# Patient Record
Sex: Female | Born: 1955 | Race: Black or African American | Hispanic: No | Marital: Single | Smoking: Current every day smoker
Health system: Southern US, Community
[De-identification: ages and names within clinical notes are randomized; demographics above are authoritative.]

## PROBLEM LIST (undated history)

## (undated) DIAGNOSIS — F329 Major depressive disorder, single episode, unspecified: Secondary | ICD-10-CM

## (undated) DIAGNOSIS — F32A Depression, unspecified: Secondary | ICD-10-CM

## (undated) DIAGNOSIS — E119 Type 2 diabetes mellitus without complications: Secondary | ICD-10-CM

## (undated) HISTORY — PX: ABDOMINAL HYSTERECTOMY: SHX81

---

## 2014-12-22 ENCOUNTER — Inpatient Hospital Stay (HOSPITAL_COMMUNITY)
Admission: EM | Admit: 2014-12-22 | Discharge: 2014-12-26 | DRG: 637 | Disposition: A | Discharge status: Home / Self Care | Payer: Medicaid Other | Attending: Internal Medicine | Admitting: Internal Medicine

## 2014-12-22 ENCOUNTER — Encounter (HOSPITAL_COMMUNITY): Payer: Self-pay | Admitting: *Deleted

## 2014-12-22 ENCOUNTER — Emergency Department (HOSPITAL_COMMUNITY): Payer: Medicaid Other

## 2014-12-22 DIAGNOSIS — D72829 Elevated white blood cell count, unspecified: Secondary | ICD-10-CM

## 2014-12-22 DIAGNOSIS — Z9119 Patient's noncompliance with other medical treatment and regimen: Secondary | ICD-10-CM | POA: Diagnosis present

## 2014-12-22 DIAGNOSIS — G9341 Metabolic encephalopathy: Secondary | ICD-10-CM | POA: Diagnosis present

## 2014-12-22 DIAGNOSIS — N179 Acute kidney failure, unspecified: Secondary | ICD-10-CM | POA: Insufficient documentation

## 2014-12-22 DIAGNOSIS — J449 Chronic obstructive pulmonary disease, unspecified: Secondary | ICD-10-CM | POA: Diagnosis present

## 2014-12-22 DIAGNOSIS — G9340 Encephalopathy, unspecified: Secondary | ICD-10-CM | POA: Diagnosis not present

## 2014-12-22 DIAGNOSIS — Z794 Long term (current) use of insulin: Secondary | ICD-10-CM

## 2014-12-22 DIAGNOSIS — E46 Unspecified protein-calorie malnutrition: Secondary | ICD-10-CM | POA: Diagnosis present

## 2014-12-22 DIAGNOSIS — Z720 Tobacco use: Secondary | ICD-10-CM | POA: Diagnosis not present

## 2014-12-22 DIAGNOSIS — E1310 Other specified diabetes mellitus with ketoacidosis without coma: Secondary | ICD-10-CM

## 2014-12-22 DIAGNOSIS — I959 Hypotension, unspecified: Secondary | ICD-10-CM | POA: Diagnosis present

## 2014-12-22 DIAGNOSIS — R680 Hypothermia, not associated with low environmental temperature: Secondary | ICD-10-CM | POA: Diagnosis present

## 2014-12-22 DIAGNOSIS — E876 Hypokalemia: Secondary | ICD-10-CM | POA: Diagnosis not present

## 2014-12-22 DIAGNOSIS — R6510 Systemic inflammatory response syndrome (SIRS) of non-infectious origin without acute organ dysfunction: Secondary | ICD-10-CM | POA: Diagnosis present

## 2014-12-22 DIAGNOSIS — E870 Hyperosmolality and hypernatremia: Secondary | ICD-10-CM | POA: Diagnosis not present

## 2014-12-22 DIAGNOSIS — D539 Nutritional anemia, unspecified: Secondary | ICD-10-CM | POA: Diagnosis present

## 2014-12-22 DIAGNOSIS — E1010 Type 1 diabetes mellitus with ketoacidosis without coma: Principal | ICD-10-CM | POA: Diagnosis present

## 2014-12-22 DIAGNOSIS — E860 Dehydration: Secondary | ICD-10-CM | POA: Diagnosis present

## 2014-12-22 DIAGNOSIS — E1042 Type 1 diabetes mellitus with diabetic polyneuropathy: Secondary | ICD-10-CM | POA: Diagnosis present

## 2014-12-22 DIAGNOSIS — E0810 Diabetes mellitus due to underlying condition with ketoacidosis without coma: Secondary | ICD-10-CM

## 2014-12-22 DIAGNOSIS — E1110 Type 2 diabetes mellitus with ketoacidosis without coma: Secondary | ICD-10-CM | POA: Insufficient documentation

## 2014-12-22 DIAGNOSIS — E0811 Diabetes mellitus due to underlying condition with ketoacidosis with coma: Secondary | ICD-10-CM

## 2014-12-22 DIAGNOSIS — J438 Other emphysema: Secondary | ICD-10-CM | POA: Diagnosis not present

## 2014-12-22 DIAGNOSIS — T68XXXA Hypothermia, initial encounter: Secondary | ICD-10-CM

## 2014-12-22 DIAGNOSIS — E1069 Type 1 diabetes mellitus with other specified complication: Secondary | ICD-10-CM | POA: Diagnosis not present

## 2014-12-22 HISTORY — DX: Major depressive disorder, single episode, unspecified: F32.9

## 2014-12-22 HISTORY — DX: Depression, unspecified: F32.A

## 2014-12-22 HISTORY — DX: Type 2 diabetes mellitus without complications: E11.9

## 2014-12-22 LAB — CBC WITH DIFFERENTIAL/PLATELET
BASOS PCT: 0 % (ref 0–1)
Basophils Absolute: 0 10*3/uL (ref 0.0–0.1)
Eosinophils Absolute: 0 10*3/uL (ref 0.0–0.7)
Eosinophils Relative: 0 % (ref 0–5)
HCT: 38.4 % (ref 36.0–46.0)
Hemoglobin: 10.9 g/dL — ABNORMAL LOW (ref 12.0–15.0)
LYMPHS PCT: 13 % (ref 12–46)
Lymphs Abs: 2.5 10*3/uL (ref 0.7–4.0)
MCH: 28.8 pg (ref 26.0–34.0)
MCHC: 28.4 g/dL — AB (ref 30.0–36.0)
MCV: 101.6 fL — ABNORMAL HIGH (ref 78.0–100.0)
MONO ABS: 1.5 10*3/uL — AB (ref 0.1–1.0)
Monocytes Relative: 8 % (ref 3–12)
NEUTROS ABS: 15 10*3/uL — AB (ref 1.7–7.7)
Neutrophils Relative %: 79 % — ABNORMAL HIGH (ref 43–77)
Platelets: 274 10*3/uL (ref 150–400)
RBC: 3.78 MIL/uL — AB (ref 3.87–5.11)
RDW: 15.5 % (ref 11.5–15.5)
WBC Morphology: INCREASED
WBC: 19 10*3/uL — ABNORMAL HIGH (ref 4.0–10.5)

## 2014-12-22 LAB — COMPREHENSIVE METABOLIC PANEL
ALT: 27 U/L (ref 14–54)
AST: 22 U/L (ref 15–41)
Albumin: 3.2 g/dL — ABNORMAL LOW (ref 3.5–5.0)
Alkaline Phosphatase: 96 U/L (ref 38–126)
BUN: 35 mg/dL — ABNORMAL HIGH (ref 6–20)
CO2: <5 mmol/L — ABNORMAL LOW (ref 22–32)
Calcium: 8.6 mg/dL — ABNORMAL LOW (ref 8.9–10.3)
Chloride: 110 mmol/L (ref 101–111)
Creatinine, Ser: 2.87 mg/dL — ABNORMAL HIGH (ref 0.44–1.00)
GFR calc non Af Amer: 17 mL/min — ABNORMAL LOW (ref >60)
GFR, EST AFRICAN AMERICAN: 20 mL/min — AB (ref >60)
GLUCOSE: 1386 mg/dL — AB (ref 65–99)
Potassium: >7.5 mmol/L (ref 3.5–5.1)
Sodium: 140 mmol/L (ref 135–145)
TOTAL PROTEIN: 5.6 g/dL — AB (ref 6.5–8.1)
Total Bilirubin: 1.3 mg/dL — ABNORMAL HIGH (ref 0.3–1.2)

## 2014-12-22 LAB — URINALYSIS, ROUTINE W REFLEX MICROSCOPIC
BILIRUBIN URINE: NEGATIVE
Leukocytes, UA: NEGATIVE
Nitrite: NEGATIVE
Protein, ur: 30 mg/dL — AB
SPECIFIC GRAVITY, URINE: 1.024 (ref 1.005–1.030)
UROBILINOGEN UA: 0.2 mg/dL (ref 0.0–1.0)
pH: 5 (ref 5.0–8.0)

## 2014-12-22 LAB — I-STAT ARTERIAL BLOOD GAS, ED
ACID-BASE DEFICIT: 28 mmol/L — AB (ref 0.0–2.0)
BICARBONATE: 2.8 meq/L — AB (ref 20.0–24.0)
O2 SAT: 97 %
PO2 ART: 144 mmHg — AB (ref 80.0–100.0)
pCO2 arterial: 13.9 mmHg — CL (ref 35.0–45.0)
pH, Arterial: 6.917 — CL (ref 7.350–7.450)

## 2014-12-22 LAB — GLUCOSE, CAPILLARY
GLUCOSE-CAPILLARY: 152 mg/dL — AB (ref 65–99)
GLUCOSE-CAPILLARY: 87 mg/dL (ref 65–99)
Glucose-Capillary: >600 mg/dL (ref 65–99)
Glucose-Capillary: 521 mg/dL — ABNORMAL HIGH (ref 65–99)
Glucose-Capillary: 479 mg/dL — ABNORMAL HIGH (ref 65–99)
Glucose-Capillary: 364 mg/dL — ABNORMAL HIGH (ref 65–99)
Glucose-Capillary: 326 mg/dL — ABNORMAL HIGH (ref 65–99)
Glucose-Capillary: 260 mg/dL — ABNORMAL HIGH (ref 65–99)
Glucose-Capillary: 120 mg/dL — ABNORMAL HIGH (ref 65–99)
Glucose-Capillary: 109 mg/dL — ABNORMAL HIGH (ref 65–99)
Glucose-Capillary: 96 mg/dL (ref 65–99)

## 2014-12-22 LAB — BASIC METABOLIC PANEL
Anion gap: 15 (ref 5–15)
Anion gap: 8 (ref 5–15)
BUN: 35 mg/dL — ABNORMAL HIGH (ref 6–20)
BUN: 30 mg/dL — ABNORMAL HIGH (ref 6–20)
BUN: 25 mg/dL — ABNORMAL HIGH (ref 6–20)
BUN: 22 mg/dL — ABNORMAL HIGH (ref 6–20)
BUN: 17 mg/dL (ref 6–20)
CALCIUM: 9.4 mg/dL (ref 8.9–10.3)
CHLORIDE: 130 mmol/L — AB (ref 101–111)
CO2: 9 mmol/L — ABNORMAL LOW (ref 22–32)
CO2: 15 mmol/L — AB (ref 22–32)
CO2: 12 mmol/L — ABNORMAL LOW (ref 22–32)
CO2: 18 mmol/L — ABNORMAL LOW (ref 22–32)
CREATININE: 1.61 mg/dL — AB (ref 0.44–1.00)
CREATININE: 1.02 mg/dL — AB (ref 0.44–1.00)
Calcium: 8.4 mg/dL — ABNORMAL LOW (ref 8.9–10.3)
Calcium: 9.7 mg/dL (ref 8.9–10.3)
Calcium: 9.4 mg/dL (ref 8.9–10.3)
Calcium: 8.8 mg/dL — ABNORMAL LOW (ref 8.9–10.3)
Chloride: 118 mmol/L — ABNORMAL HIGH (ref 101–111)
Chloride: >130 mmol/L (ref 101–111)
Chloride: 128 mmol/L — ABNORMAL HIGH (ref 101–111)
Creatinine, Ser: 2.51 mg/dL — ABNORMAL HIGH (ref 0.44–1.00)
Creatinine, Ser: 2.13 mg/dL — ABNORMAL HIGH (ref 0.44–1.00)
Creatinine, Ser: 1.17 mg/dL — ABNORMAL HIGH (ref 0.44–1.00)
GFR calc Af Amer: 23 mL/min — ABNORMAL LOW (ref >60)
GFR calc Af Amer: 28 mL/min — ABNORMAL LOW (ref >60)
GFR calc Af Amer: 40 mL/min — ABNORMAL LOW (ref >60)
GFR calc Af Amer: 58 mL/min — ABNORMAL LOW (ref >60)
GFR calc non Af Amer: 24 mL/min — ABNORMAL LOW (ref >60)
GFR calc non Af Amer: 34 mL/min — ABNORMAL LOW (ref >60)
GFR calc non Af Amer: 59 mL/min — ABNORMAL LOW (ref >60)
GFR, EST NON AFRICAN AMERICAN: 20 mL/min — AB (ref >60)
GFR, EST NON AFRICAN AMERICAN: 50 mL/min — AB (ref >60)
GLUCOSE: 619 mg/dL — AB (ref 65–99)
Glucose, Bld: 1114 mg/dL (ref 65–99)
Glucose, Bld: 273 mg/dL — ABNORMAL HIGH (ref 65–99)
Glucose, Bld: 119 mg/dL — ABNORMAL HIGH (ref 65–99)
Glucose, Bld: 142 mg/dL — ABNORMAL HIGH (ref 65–99)
POTASSIUM: 3.7 mmol/L (ref 3.5–5.1)
POTASSIUM: 5.9 mmol/L — AB (ref 3.5–5.1)
Potassium: 5.6 mmol/L — ABNORMAL HIGH (ref 3.5–5.1)
Potassium: 3.7 mmol/L (ref 3.5–5.1)
Potassium: 4 mmol/L (ref 3.5–5.1)
SODIUM: 148 mmol/L — AB (ref 135–145)
Sodium: 154 mmol/L — ABNORMAL HIGH (ref 135–145)
Sodium: 159 mmol/L — ABNORMAL HIGH (ref 135–145)
Sodium: 158 mmol/L — ABNORMAL HIGH (ref 135–145)
Sodium: 154 mmol/L — ABNORMAL HIGH (ref 135–145)

## 2014-12-22 LAB — RAPID URINE DRUG SCREEN, HOSP PERFORMED
AMPHETAMINES: NOT DETECTED
BARBITURATES: NOT DETECTED
Benzodiazepines: NOT DETECTED
Cocaine: POSITIVE — AB
Opiates: NOT DETECTED
TETRAHYDROCANNABINOL: NOT DETECTED

## 2014-12-22 LAB — I-STAT CG4 LACTIC ACID, ED
LACTIC ACID, VENOUS: 4.37 mmol/L — AB (ref 0.5–2.0)
Lactic Acid, Venous: 4.36 mmol/L (ref 0.5–2.0)

## 2014-12-22 LAB — URINE MICROSCOPIC-ADD ON

## 2014-12-22 LAB — TROPONIN I
Troponin I: <0.03 ng/mL (ref <0.031)
Troponin I: <0.03 ng/mL (ref <0.031)
Troponin I: <0.03 ng/mL (ref <0.031)

## 2014-12-22 LAB — MAGNESIUM
MAGNESIUM: 2.9 mg/dL — AB (ref 1.7–2.4)
MAGNESIUM: 2.8 mg/dL — AB (ref 1.7–2.4)

## 2014-12-22 LAB — PHOSPHORUS
PHOSPHORUS: 10.9 mg/dL — AB (ref 2.5–4.6)
Phosphorus: 8.2 mg/dL — ABNORMAL HIGH (ref 2.5–4.6)

## 2014-12-22 LAB — BETA-HYDROXYBUTYRIC ACID: Beta-Hydroxybutyric Acid: >8 mmol/L — ABNORMAL HIGH (ref 0.05–0.27)

## 2014-12-22 LAB — PROCALCITONIN: PROCALCITONIN: 0.56 ng/mL

## 2014-12-22 LAB — MRSA PCR SCREENING: MRSA BY PCR: NEGATIVE

## 2014-12-22 LAB — CBG MONITORING, ED
Glucose-Capillary: >600 mg/dL (ref 65–99)
Glucose-Capillary: >600 mg/dL (ref 65–99)

## 2014-12-22 LAB — LACTIC ACID, PLASMA: Lactic Acid, Venous: 2.5 mmol/L (ref 0.5–2.0)

## 2014-12-22 MED ORDER — CEFTRIAXONE SODIUM IN DEXTROSE 20 MG/ML IV SOLN
1.0000 g | INTRAVENOUS | Status: DC
  Administered 2014-12-22: 1 g via INTRAVENOUS
  Filled 2014-12-22 (×2): qty 50

## 2014-12-22 MED ORDER — DEXTROSE-NACL 5-0.45 % IV SOLN: INTRAVENOUS | Status: DC

## 2014-12-22 MED ORDER — INSULIN ASPART 100 UNIT/ML ~~LOC~~ SOLN
0.0000 [IU] | SUBCUTANEOUS | Status: DC
  Administered 2014-12-22: 2 [IU] via SUBCUTANEOUS
  Administered 2014-12-23: 5 [IU] via SUBCUTANEOUS
  Administered 2014-12-23: 2 [IU] via SUBCUTANEOUS
  Administered 2014-12-23: 4 [IU] via SUBCUTANEOUS
  Administered 2014-12-23 (×2): 5 [IU] via SUBCUTANEOUS
  Administered 2014-12-24 (×2): 1 [IU] via SUBCUTANEOUS
  Administered 2014-12-24: 3 [IU] via SUBCUTANEOUS

## 2014-12-22 MED ORDER — SODIUM CHLORIDE 0.9 % IV BOLUS (SEPSIS)
2000.0000 mL | Freq: Once | INTRAVENOUS | Status: AC
Start: 2014-12-22 — End: 2014-12-22
  Administered 2014-12-22: 2000 mL via INTRAVENOUS

## 2014-12-22 MED ORDER — VANCOMYCIN HCL IN DEXTROSE 1-5 GM/200ML-% IV SOLN
1000.0000 mg | INTRAVENOUS | Status: AC
  Administered 2014-12-22: 1000 mg via INTRAVENOUS
  Filled 2014-12-22: qty 200

## 2014-12-22 MED ORDER — SODIUM CHLORIDE 0.45 % IV SOLN
INTRAVENOUS | Status: DC
  Administered 2014-12-22: 20:00:00 via INTRAVENOUS

## 2014-12-22 MED ORDER — POTASSIUM CHLORIDE 10 MEQ/100ML IV SOLN
10.0000 meq | INTRAVENOUS | Status: AC
  Administered 2014-12-22 (×4): 10 meq via INTRAVENOUS
  Filled 2014-12-22 (×4): qty 100

## 2014-12-22 MED ORDER — ONDANSETRON HCL 4 MG/2ML IJ SOLN
INTRAMUSCULAR | Status: AC
  Administered 2014-12-22: 4 mg
  Filled 2014-12-22: qty 2

## 2014-12-22 MED ORDER — SODIUM BICARBONATE 8.4 % IV SOLN
50.0000 meq | Freq: Once | INTRAVENOUS | Status: AC
  Administered 2014-12-22: 50 meq via INTRAVENOUS
  Filled 2014-12-22: qty 50

## 2014-12-22 MED ORDER — SODIUM CHLORIDE 0.9 % IV SOLN: 500.0000 mg | INTRAVENOUS | Status: DC

## 2014-12-22 MED ORDER — SODIUM CHLORIDE 0.9 % IV SOLN
INTRAVENOUS | Status: DC
  Administered 2014-12-22: 5.4 [IU]/h via INTRAVENOUS
  Filled 2014-12-22: qty 2.5

## 2014-12-22 MED ORDER — SODIUM CHLORIDE 0.9 % IV SOLN: INTRAVENOUS | Status: AC

## 2014-12-22 MED ORDER — SODIUM CHLORIDE 0.9 % IV SOLN
INTRAVENOUS | Status: DC
  Administered 2014-12-22 (×2): via INTRAVENOUS

## 2014-12-22 MED ORDER — DEXTROSE-NACL 5-0.45 % IV SOLN
INTRAVENOUS | Status: DC
  Administered 2014-12-22 – 2014-12-23 (×3): via INTRAVENOUS

## 2014-12-22 MED ORDER — ONDANSETRON HCL 4 MG/2ML IJ SOLN
4.0000 mg | Freq: Once | INTRAMUSCULAR | Status: AC
  Administered 2014-12-22: 4 mg via INTRAVENOUS

## 2014-12-22 MED ORDER — PIPERACILLIN-TAZOBACTAM IN DEX 2-0.25 GM/50ML IV SOLN
2.2500 g | Freq: Three times a day (TID) | INTRAVENOUS | Status: DC
  Filled 2014-12-22 (×2): qty 50

## 2014-12-22 MED ORDER — INSULIN REGULAR HUMAN 100 UNIT/ML IJ SOLN
INTRAMUSCULAR | Status: DC
  Administered 2014-12-22: 10.8 [IU]/h via INTRAVENOUS
  Filled 2014-12-22 (×2): qty 2.5

## 2014-12-22 MED ORDER — PIPERACILLIN-TAZOBACTAM 3.375 G IVPB 30 MIN
3.3750 g | Freq: Once | INTRAVENOUS | Status: AC
  Administered 2014-12-22: 3.375 g via INTRAVENOUS
  Filled 2014-12-22: qty 50

## 2014-12-22 MED ORDER — HEPARIN SODIUM (PORCINE) 5000 UNIT/ML IJ SOLN
5000.0000 [IU] | Freq: Three times a day (TID) | INTRAMUSCULAR | Status: DC
  Administered 2014-12-22 – 2014-12-25 (×10): 5000 [IU] via SUBCUTANEOUS
  Filled 2014-12-22 (×9): qty 1

## 2014-12-22 NOTE — ED Notes (Signed)
Pt restless .  Temp of 90.9 rectal bair hugger applied.   No visits in this ed previously

## 2014-12-22 NOTE — ED Provider Notes (Signed)
CSN: [id number]     Arrival date & time 12/22/14  0253 History  This chart was scribed for  Nelda Bucks, MD by Bethel Born, ED Scribe. This patient was seen in room D35C/D35C and the patient's care was started at 3:36 AM.    Chief Complaint  Patient presents with  . unresponsive    The history is provided by the EMS personnel. No language interpreter was used.  Charonda Hefter is a 59 y.o. female who presents to the Emergency Department complaining of unresponsiveness. Per EMS the pt, who is a known diabetic,  was found "breathing funny" and "not acting right". History is limited due o patient's nonverbal status.   Past Medical History  Diagnosis Date  . Diabetes mellitus without complication    History reviewed. No pertinent past surgical history. No family history on file. History  Substance Use Topics  . Smoking status: Not on file  . Smokeless tobacco: Not on file  . Alcohol Use: Not on file   OB History    No data available     Review of Systems  Unable to perform ROS: Patient nonverbal      Allergies  Review of patient's allergies indicates not on file.  Home Medications   Prior to Admission medications   Not on File   BP 88/56 mmHg  Pulse 88  Temp(Src) 91.4 F (33 C) (Core (Comment))  Resp 16  SpO2 100% Physical Exam  Constitutional: She is oriented to person, place, and time. She appears well-developed and well-nourished. No distress.  HENT:  Head: Normocephalic.  Mouth/Throat: Oropharynx is clear and moist.  Eyes: Pupils are equal, round, and reactive to light.  Neck: Neck supple.  Cardiovascular: Normal rate, regular rhythm and normal heart sounds.   Pulmonary/Chest: Effort normal and breath sounds normal. Tachypnea noted. No respiratory distress. She has no wheezes.  Abdominal: Soft. She exhibits no distension. There is no tenderness. There is no rebound and no guarding.  Musculoskeletal: She exhibits no edema or tenderness.   Neurological: She is alert and oriented to person, place, and time. GCS eye subscore is 3. GCS verbal subscore is 4. GCS motor subscore is 4.  Skin: Skin is warm and dry.  Psychiatric: She has a normal mood and affect.  Nursing note and vitals reviewed.   ED Course  Procedures   Peripheral IV Placement Performed by: Nelda Bucks, MD Indication: Vascular access, blood draw, RN and Phlebotomy Tech unable Skin Prep: Alcohol Location: Right EJ Catheter size: 20 Catheter flushed and secured per standard protocol. Tolerated well without complication.  DIAGNOSTIC STUDIES: Oxygen Saturation is 100% on RA, normal by my interpretation.    COORDINATION OF CARE: 3:36 AM  Treatment plan includes   Labs Review Labs Reviewed  COMPREHENSIVE METABOLIC PANEL - Abnormal; Notable for the following:    Potassium >7.5 (*)    CO2 <5 (*)    Glucose, Bld 1386 (*)    BUN 35 (*)    Creatinine, Ser 2.87 (*)    Calcium 8.6 (*)    Total Protein 5.6 (*)    Albumin 3.2 (*)    Total Bilirubin 1.3 (*)    GFR calc non Af Amer 17 (*)    GFR calc Af Amer 20 (*)    All other components within normal limits  CBC WITH DIFFERENTIAL/PLATELET - Abnormal; Notable for the following:    WBC 19.0 (*)    RBC 3.78 (*)    Hemoglobin 10.9 (*)  MCV 101.6 (*)    MCHC 28.4 (*)    Neutrophils Relative % 79 (*)    Neutro Abs 15.0 (*)    Monocytes Absolute 1.5 (*)    All other components within normal limits  MAGNESIUM - Abnormal; Notable for the following:    Magnesium 2.9 (*)    All other components within normal limits  PHOSPHORUS - Abnormal; Notable for the following:    Phosphorus 10.9 (*)    All other components within normal limits  CBG MONITORING, ED - Abnormal; Notable for the following:    Glucose-Capillary >600 (*)    All other components within normal limits  I-STAT CG4 LACTIC ACID, ED - Abnormal; Notable for the following:    Lactic Acid, Venous 4.37 (*)    All other components within normal  limits  URINE CULTURE  CULTURE, BLOOD (ROUTINE X 2)  CULTURE, BLOOD (ROUTINE X 2)  CULTURE, BLOOD (ROUTINE X 2)  CULTURE, BLOOD (ROUTINE X 2)  URINE CULTURE  BLOOD GAS, VENOUS  URINALYSIS, ROUTINE W REFLEX MICROSCOPIC (NOT AT [facility])  BLOOD GAS, ARTERIAL  BASIC METABOLIC PANEL  BASIC METABOLIC PANEL  BASIC METABOLIC PANEL  BASIC METABOLIC PANEL  BASIC METABOLIC PANEL  MAGNESIUM  PHOSPHORUS  BETA-HYDROXYBUTYRIC ACID  TROPONIN I  TROPONIN I  TROPONIN I  URINALYSIS, ROUTINE W REFLEX MICROSCOPIC (NOT AT [facility])  URINE RAPID DRUG SCREEN, HOSP PERFORMED  URINE DRUG SCREEN, QUALITATIVE ([facility] ONLY)  HEMOGLOBIN A1C  I-STAT CG4 LACTIC ACID, ED  I-STAT CG4 LACTIC ACID, ED    Imaging Review Dg Chest Portable 1 View  12/22/2014   CLINICAL DATA:  Unresponsive.  EXAM: PORTABLE CHEST - 1 VIEW  COMPARISON:  None.  FINDINGS: A single AP portable view of the chest demonstrates no focal airspace consolidation or alveolar edema. The lungs are grossly clear. There is no large effusion or pneumothorax. Cardiac and mediastinal contours appear unremarkable.  IMPRESSION: No active disease.   Electronically Signed   By: Ellery Plunkaniel R Mitchell M.D.   On: 12/22/2014 03:37     EKG Interpretation None       CRITICAL CARE Performed by: Toy CookeyHERTY, Ulys Favia, E Total critical care time: 70 Critical care time was exclusive of separately billable procedures and treating other patients. Critical care was necessary to treat or prevent imminent or life-threatening deterioration. Critical care was time spent personally by me on the following activities: development of treatment plan with patient and/or surrogate as well as nursing, discussions with consultants, evaluation of patient's response to treatment, examination of patient, obtaining history from patient or surrogate, ordering and performing treatments and interventions, ordering and review of laboratory studies, ordering and review of radiographic studies, pulse  oximetry and re-evaluation of patient's condition.   MDM   Final diagnoses:  Diabetic ketoacidosis associated with other specified diabetes mellitus  Leukocytosis  Acute renal failure, unspecified acute renal failure type  Hypothermia, initial encounter   Patient found altered and breathing funny.  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  She is a known diabetic.  Per EMS glucose critical.  On exam, patient had Kussmal respirations, dry skin and mucus membranes.  Her mental status gradually improving with IV fluid administration, though she is nonverbal at time of arrival.  Patient found to have a pH of 6.8, PCO2 of 19.4, bicarbonate of 3.1.  Significant lactic acidosis.  A 4.37.  Leukocytosis of 19, and a glucose of 1386.  Patient received 3 L IV fluid bolus before being started on  the glucose stabilizer protocol.  She is also received an amp of bicarbonate, given her significantly low pH.  Mental status is slowly improving.  I believe she is maintaining her airway, does not need to be intubated at this point.  Spoke with critical care medicine, who recommends continued boluses of IV fluids, glucostabilizer protocol.  Patient will be admitted to ICU.    I personally performed the services described in this documentation, which was scribed in my presence. The recorded information has been reviewed and is accurate.  Ed Blalock, MD 12/22/14 [phone]

## 2014-12-22 NOTE — ED Notes (Signed)
CBG exceeds measures. Nurse notified.

## 2014-12-22 NOTE — Progress Notes (Addendum)
eLink Physician-Brief Progress Note Patient Name: Meagan Cummings DOB: 28-Oct-1955 MRN: [id number]   Date of Service  12/22/2014  HPI/Events of Note  On insulin IV infusion at 0.3 units/hour for DKA. Last blood glucose = 149. Anion Gap = 8. Also on D5 IV infusion.   eICU Interventions  Will transition to Q 4 hour sensitive Novolog SSI. No Lantus at this time d/t low insulin infusion rate.     Intervention Category Intermediate Interventions: Hyperglycemia - evaluation and treatment  Sommer,Steven Eugene 12/22/2014, 11:11 PM

## 2014-12-22 NOTE — ED Notes (Signed)
The pt is more responsive. Opening both eyes moving around restless.   Not followijng commands  Attempting to talk unable to understand

## 2014-12-22 NOTE — Progress Notes (Signed)
ANTIBIOTIC CONSULT NOTE - INITIAL  Pharmacy Consult for Vancomycin and Zosyn Indication: sepsis  Allergies not on file  Patient Measurements:    Vital Signs: Temp: 91.4 F (33 C) (07/03 0622) Temp Source: Core (Comment) (07/03 0544) BP: 88/56 mmHg (07/03 0500) Pulse Rate: 88 (07/03 0445) Intake/Output from previous day: 07/02 0701 - 07/03 0700 In: 2000 [I.V.:2000] Out: 500 [Urine:500] Intake/Output from this shift: Total I/O In: 2000 [I.V.:2000] Out: 500 [Urine:500]  Labs:  Recent Labs  12/22/14 0346  WBC 19.0*  HGB 10.9*  PLT 274  CREATININE 2.87*   CrCl cannot be calculated (Unknown ideal weight.). No results for input(s): VANCOTROUGH, VANCOPEAK, VANCORANDOM, GENTTROUGH, GENTPEAK, GENTRANDOM, TOBRATROUGH, TOBRAPEAK, TOBRARND, AMIKACINPEAK, AMIKACINTROU, AMIKACIN in the last 72 hours.   Microbiology: No results found for this or any previous visit (from the past 720 hour(s)).  Medical History: Past Medical History  Diagnosis Date  . Diabetes mellitus without complication     Medications:  See electronic med rec  Assessment: 59 y.o. female presents with AMS/nonresponsive. Pt noted to be in DKA. To be started on broad spectrum abx (Vanc and Zosyn) for sepsis. WBC elevated to 19. LA 4.36. Scr 2.87, est CrCl 16 ml/min.  Goal of Therapy:  Vancomycin trough level 15-20 mcg/ml  Plan:  Zosyn 3.375gm IV now then 2.25 gm IV  Vancomycin 1gm IV now then 500mg  IV q48h Will f/u renal function, micro data, and pt's clinical condition Vanc trough prn  Christoper Fabianaron Zohar Maroney, PharmD, BCPS Clinical pharmacist, pager [phone] 12/22/2014,6:29 AM

## 2014-12-22 NOTE — Progress Notes (Addendum)
CRITICAL VALUE ALERT  Critical value received:  Lactic acid 2.5  Date of notification:  73/08/2014  Time of notification:  12:10  Critical value read back:Yes.     Nurse who received alert:  Victorino DikeLeslie Kenzlie Disch   MD notified (1st page):  Orpha BurKaty NP  Time of first page:  12:10      Responding MD:  Orpha BurKaty NP  Time MD responded:  12:10

## 2014-12-22 NOTE — Progress Notes (Signed)
ANTIBIOTIC CONSULT NOTE - INITIAL  Pharmacy Consult for Vancomycin and Zosyn Indication: sepsis  No Known Allergies  Patient Measurements: Height: 5\' 2"  (157.5 cm) Weight: 94 lb 9.2 oz (42.9 kg) IBW/kg (Calculated) : 50.1  Vital Signs: Temp: 93.4 F (34.1 C) (07/03 0800) Temp Source: Core (Comment) (07/03 0544) BP: 89/56 mmHg (07/03 0800) Pulse Rate: 99 (07/03 0800) Intake/Output from previous day: 07/02 0701 - 07/03 0700 In: 3000 [I.V.:3000] Out: 500 [Urine:500] Intake/Output from this shift: Total I/O In: 210.9 [I.V.:10.9; IV Piggyback:200] Out: 825 [Urine:825]  Labs:  Recent Labs  12/22/14 0346 12/22/14 0718  WBC 19.0*  --   HGB 10.9*  --   PLT 274  --   CREATININE 2.87* 2.51*   Estimated Creatinine Clearance: 16.5 mL/min (by C-G formula based on Cr of 2.51). No results for input(s): VANCOTROUGH, VANCOPEAK, VANCORANDOM, GENTTROUGH, GENTPEAK, GENTRANDOM, TOBRATROUGH, TOBRAPEAK, TOBRARND, AMIKACINPEAK, AMIKACINTROU, AMIKACIN in the last 72 hours.   Microbiology: No results found for this or any previous visit (from the past 720 hour(s)).  Medical History: Past Medical History  Diagnosis Date  . Diabetes mellitus without complication     Medications:  See electronic med rec  Assessment: 59 y.o. female presents with AMS/nonresponsive. Pt noted to be in DKA. To be started on broad spectrum abx (Vanc and Zosyn) for sepsis. WBC elevated to 19. LA 4.36. Scr 2.87, est CrCl 16 ml/min.  Goal of Therapy:  Vancomycin trough level 15-20 mcg/ml  Plan:  Zosyn 3.375gm IV now then 2.25 gm IV  Vancomycin 1gm IV now then 500mg  IV q48h Will f/u renal function, micro data, and pt's clinical condition Vanc trough prn  Christoper Fabianaron Amend, PharmD, BCPS Clinical pharmacist, pager [phone] 12/22/2014,8:56 AM   Addendum:  Antibiotics narrowed to ceftriaxone. Plan: Ceftriaxone 1g IV daily Pharmacy will sign off for antibiotic dosing as no adjustments for ceftriaxone are  needed.  Celedonio MiyamotoJeremy Mikalyn Hermida, PharmD, BCPS-AQ ID Clinical Pharmacist Pager [phone]

## 2014-12-22 NOTE — Progress Notes (Signed)
eLink Physician-Brief Progress Note Patient Name: Meagan Cummings DOB: 1955-12-26 MRN: [id number]   Date of Service  12/22/2014  HPI/Events of Note  Call from EDP  - severe DKA with confusion, renal failure, lactic acidosis, pH 6.8. Some improved afte 3L Fluid bolus  eICU Interventions  More lfuid bolus dKA protocol order set Await formal eval by bedsid MD of pccm     Intervention Category Major Interventions: Acid-Base disturbance - evaluation and management  Axell Trigueros 12/22/2014, 6:26 AM

## 2014-12-22 NOTE — ED Notes (Signed)
3rd liter running per edps order.. #20 angiocath placed lt external jug vein

## 2014-12-22 NOTE — ED Notes (Signed)
The pt arrived by [facility] from the homed a a patient of hers.  Not acting right when she arrived tonight.  Found  Breathing funny and NOT ACTING RIGHT.    When ems arrived rapid respirations   bp low.  Strong odor of ketones on her breath,  On arrival to the ed kuss-maul respirations skin very dry.  cbg high.  Not opening her eyes not answering questions.  Unable to follow   Instructions .  Iv per ems  bolus nss

## 2014-12-22 NOTE — Progress Notes (Signed)
eLink Physician-Brief Progress Note Patient Name: Ronda FairlyLeslie Walker-Reading DOB: 05/17/1956 MRN: [id number]   Date of Service  12/22/2014  HPI/Events of Note  Na trending up as glucose trending down and now up to 159 on NS    eICU Interventions  Change maint fluids to half NS      Intervention Category Major Interventions: Acid-Base disturbance - evaluation and management;Electrolyte abnormality - evaluation and management  Sandrea HughsMichael Xavius Spadafore 12/22/2014, 7:28 PM

## 2014-12-22 NOTE — ED Notes (Signed)
Pt still  Agitated. Temp foley # 16 inserted dilute urine coming back through tubing.  Temp90.3 per temp foley

## 2014-12-22 NOTE — H&P (Signed)
PULMONARY / CRITICAL CARE MEDICINE   Name: Meagan Cummings MRN: [id number] DOB: 1955/11/20    ADMISSION DATE:  12/22/2014  REFERRING MD :  EDP   CHIEF COMPLAINT:  AMS  INITIAL PRESENTATION: 59 yo female with hx DM presented 7/3 when family found her altered and "breathing funny".  In ER found to have glucose >1300, acidotic with pH 6.9, hypothermic and PCCM called to admit to ICU for severe DKA.   STUDIES:    SIGNIFICANT EVENTS:    HISTORY OF PRESENT ILLNESS:  60 yo female with hx DM presented 7/3 when family found her altered and "breathing funny".  In ER found to have glucose >1300, acidotic with pH 6.9 and PCCM called to [facility] ICU for severe DKA. No other history available as pt confused, no family available.   PAST MEDICAL HISTORY :   has a past medical history of Diabetes mellitus without complication.  has no past surgical history on file. Prior to Admission medications   Not on File   No Known Allergies  FAMILY HISTORY:  has no family status information on file.  SOCIAL HISTORY:    REVIEW OF SYSTEMS:  Unable, pt confused.   SUBJECTIVE:   VITAL SIGNS: Temp:  [90.3 F (32.4 C)-93.4 F (34.1 C)] 93.4 F (34.1 C) (07/03 0800) Pulse Rate:  [88-99] 99 (07/03 0800) Resp:  [16-28] 22 (07/03 0800) BP: (83-107)/(47-62) 89/56 mmHg (07/03 0800) SpO2:  [100 %] 100 % (07/03 0800) Weight:  [94 lb 9.2 oz (42.9 kg)-110 lb (49.896 kg)] 94 lb 9.2 oz (42.9 kg) (07/03 0800) HEMODYNAMICS:   VENTILATOR SETTINGS:   INTAKE / OUTPUT:  Intake/Output Summary (Last 24 hours) at 12/22/14 4098 Last data filed at 12/22/14 0805  Gross per 24 hour  Intake 3210.89 ml  Output   1325 ml  Net 1885.89 ml    PHYSICAL EXAMINATION: General:  Thin, frail female, acutely ill appearing  Neuro:  Awake, confused, follows some commands intermittently, protecting airway.  HEENT:  Mm dry, no JVD, L EJ PIV c/d  Cardiovascular:  s1s2 rrr, tachy Lungs:  resps even, tachypneic, no increased  WOB  Abdomen:  Soft, appears mildly tender, hypoactive bs  Musculoskeletal:  Cool, dry, on bair hugger   LABS:  CBC  Recent Labs Lab 12/22/14 0346  WBC 19.0*  HGB 10.9*  HCT 38.4  PLT 274   Coag's No results for input(s): APTT, INR in the last 168 hours. BMET  Recent Labs Lab 12/22/14 0346 12/22/14 0718  NA 140 148*  K >7.5* 5.6*  CL 110 118*  CO2 <5* <5*  BUN 35* 35*  CREATININE 2.87* 2.51*  GLUCOSE 1386* 1114*   Electrolytes  Recent Labs Lab 12/22/14 0346 12/22/14 0718  CALCIUM 8.6* 8.4*  MG 2.9* 2.8*  PHOS 10.9* 8.2*   Sepsis Markers  Recent Labs Lab 12/22/14 0408 12/22/14 0617  LATICACIDVEN 4.37* 4.36*   ABG  Recent Labs Lab 12/22/14 0657  PHART 6.917*  PCO2ART 13.9*  PO2ART 144.0*   Liver Enzymes  Recent Labs Lab 12/22/14 0346  AST 22  ALT 27  ALKPHOS 96  BILITOT 1.3*  ALBUMIN 3.2*   Cardiac Enzymes  Recent Labs Lab 12/22/14 0718  TROPONINI <0.03   Glucose  Recent Labs Lab 12/22/14 0329 12/22/14 0729  GLUCAP >600* >600*    Imaging Dg Chest Portable 1 View  12/22/2014   CLINICAL DATA:  Unresponsive.  EXAM: PORTABLE CHEST - 1 VIEW  COMPARISON:  None.  FINDINGS: A single AP portable view  of the chest demonstrates no focal airspace consolidation or alveolar edema. The lungs are grossly clear. There is no large effusion or pneumothorax. Cardiac and mediastinal contours appear unremarkable.  IMPRESSION: No active disease.   Electronically Signed   By: Ellery Plunkaniel R Mitchell M.D.   On: 12/22/2014 03:37     ASSESSMENT / PLAN:  PULMONARY Respiratory compensation for severe metabolic acidosis  P:   F/u ABG  rx DKA as below  Supplemental O2 as needed  F/u CXR   CARDIOVASCULAR Tachycardia  Hypotension - r/t dehydration, DKA  P:  Trend troponin   RENAL Severe anion gap acidosis - DKA  Hyperphosphatemia  Hypermagnesemia  AKI  Dehydration  P:   F/u lactate  DKA protocol as below  Insulin gtt Aggressive volume  resuscitation  q4 BMET - repeat currently pending  Repeat mg, phos now   GASTROINTESTINAL Protein calorie malnutrition  P:   NPO for now PPI  Dietitian consult - DM education  Supplements when taking PO   HEMATOLOGIC Anemia - mild.  Macrocytic P:  F/u cbc  Anemia panel  SQ heparin   INFECTIOUS NO obvious source infection - but DKA and leukocytosis so will treat empirically and pan culture.   Leukocytosis  P:   BCx2  7/3>>> UC 7/3>>> Vanc/Zosyn in ER 7/3 Ceftriaxone 7/3>>>  Treat empirically for now  Pan culture  Check pct   ENDOCRINE Severe DKA - suspect noncompliance as trigger for DKA.  Cannot r/o infection DM  P:   Insulin gtt per protocol  F/u ABG  q4 BMET  DM education prior to d/c  Empiric abx for now   NEUROLOGIC AMS - r/t DKA.  Improving slowly  P:   Avoid sedation  Supportive care    FAMILY  - Updates: no family available 7/3  - Inter-disciplinary family meet or Palliative Care meeting due by:  7/10    Dirk DressKaty Whiteheart, NP 12/22/2014  8:23 AM Pager: [phone] or [phone]   STAFF NOTE: Cindi CarbonI, Daniel Feinstein, MD FACP have personally reviewed patient's available data, including medical history, events of note, physical examination and test results as part of my evaluation. I have discussed with resident/NP and other care providers such as pharmacist, RN and RRT. In addition, I personally evaluated patient and elicited key findings of: more awake kthen prior, lungs clear, soft abdo, mild tenderness, temp is up, she did not report any symptoms of infection, she stated has not taken her DM meds, HONK/ DKA, volume, insulin drip, dc vosyn, add ceftriaxone empiric until temp recovers and cultures noted negative, avoid sedation, may need repeat abg, close observation K, keep NPO, PCT algo may help to limit abx as well, to ICU The patient is critically ill with multiple organ systems failure and requires high complexity decision making for  assessment and support, frequent evaluation and titration of therapies, application of advanced monitoring technologies and extensive interpretation of multiple databases.   Critical Care Time devoted to patient care services described in this note is30 Minutes. This time reflects time of care of this signee: Rory Percyaniel Feinstein, MD FACP. This critical care time does not reflect procedure time, or teaching time or supervisory time of PA/NP/Med student/Med Resident etc but could involve care discussion time. Rest per NP/medical resident whose note is outlined above and that I agree with   Mcarthur Rossettianiel J. Tyson AliasFeinstein, MD, FACP Pgr: [phone] [facility] 12/22/2014 9:58 AM

## 2014-12-22 NOTE — Progress Notes (Addendum)
Per Orpha BurKaty NP, pt's anion gap needs to be closed before tapering off of Insulin gtt. D5-1./ NS 125 ml/hr started per protocol oreders.  Victorino DikeLeslie Kemya Shed RN

## 2014-12-23 ENCOUNTER — Encounter (HOSPITAL_COMMUNITY): Payer: Self-pay

## 2014-12-23 DIAGNOSIS — N170 Acute kidney failure with tubular necrosis: Secondary | ICD-10-CM

## 2014-12-23 LAB — BASIC METABOLIC PANEL
ANION GAP: 11 (ref 5–15)
ANION GAP: 13 (ref 5–15)
ANION GAP: 9 (ref 5–15)
BUN: 16 mg/dL (ref 6–20)
BUN: 13 mg/dL (ref 6–20)
BUN: 11 mg/dL (ref 6–20)
CALCIUM: 8.5 mg/dL — AB (ref 8.9–10.3)
CO2: 15 mmol/L — AB (ref 22–32)
CO2: 14 mmol/L — ABNORMAL LOW (ref 22–32)
CO2: 13 mmol/L — AB (ref 22–32)
CREATININE: 0.95 mg/dL (ref 0.44–1.00)
Calcium: 8.8 mg/dL — ABNORMAL LOW (ref 8.9–10.3)
Calcium: 8.5 mg/dL — ABNORMAL LOW (ref 8.9–10.3)
Chloride: 129 mmol/L — ABNORMAL HIGH (ref 101–111)
Chloride: 124 mmol/L — ABNORMAL HIGH (ref 101–111)
Chloride: 126 mmol/L — ABNORMAL HIGH (ref 101–111)
Creatinine, Ser: 0.94 mg/dL (ref 0.44–1.00)
Creatinine, Ser: 0.92 mg/dL (ref 0.44–1.00)
GFR calc Af Amer: >60 mL/min (ref >60)
GFR calc Af Amer: >60 mL/min (ref >60)
GFR calc Af Amer: >60 mL/min (ref >60)
GFR calc non Af Amer: >60 mL/min (ref >60)
GLUCOSE: 166 mg/dL — AB (ref 65–99)
GLUCOSE: 285 mg/dL — AB (ref 65–99)
Glucose, Bld: 307 mg/dL — ABNORMAL HIGH (ref 65–99)
POTASSIUM: 3.8 mmol/L (ref 3.5–5.1)
POTASSIUM: 3.7 mmol/L (ref 3.5–5.1)
POTASSIUM: 3.4 mmol/L — AB (ref 3.5–5.1)
SODIUM: 155 mmol/L — AB (ref 135–145)
SODIUM: 148 mmol/L — AB (ref 135–145)
Sodium: 151 mmol/L — ABNORMAL HIGH (ref 135–145)

## 2014-12-23 LAB — GLUCOSE, CAPILLARY
GLUCOSE-CAPILLARY: 128 mg/dL — AB (ref 65–99)
GLUCOSE-CAPILLARY: 138 mg/dL — AB (ref 65–99)
GLUCOSE-CAPILLARY: 259 mg/dL — AB (ref 65–99)
Glucose-Capillary: 114 mg/dL — ABNORMAL HIGH (ref 65–99)
Glucose-Capillary: 149 mg/dL — ABNORMAL HIGH (ref 65–99)
Glucose-Capillary: 170 mg/dL — ABNORMAL HIGH (ref 65–99)
Glucose-Capillary: 194 mg/dL — ABNORMAL HIGH (ref 65–99)
Glucose-Capillary: 245 mg/dL — ABNORMAL HIGH (ref 65–99)
Glucose-Capillary: 280 mg/dL — ABNORMAL HIGH (ref 65–99)
Glucose-Capillary: 295 mg/dL — ABNORMAL HIGH (ref 65–99)

## 2014-12-23 LAB — URINALYSIS, ROUTINE W REFLEX MICROSCOPIC
BILIRUBIN URINE: NEGATIVE
Ketones, ur: 40 mg/dL — AB
Leukocytes, UA: NEGATIVE
NITRITE: NEGATIVE
PROTEIN: 30 mg/dL — AB
SPECIFIC GRAVITY, URINE: 1.029 (ref 1.005–1.030)
UROBILINOGEN UA: 0.2 mg/dL (ref 0.0–1.0)
pH: 5.5 (ref 5.0–8.0)

## 2014-12-23 LAB — TROPONIN I
Troponin I: <0.03 ng/mL (ref <0.031)
Troponin I: 0.03 ng/mL (ref <0.031)
Troponin I: <0.03 ng/mL (ref <0.031)
Troponin I: <0.03 ng/mL (ref <0.031)

## 2014-12-23 LAB — URINE MICROSCOPIC-ADD ON

## 2014-12-23 LAB — URINE CULTURE: CULTURE: NO GROWTH

## 2014-12-23 LAB — PROCALCITONIN: PROCALCITONIN: 1.04 ng/mL

## 2014-12-23 LAB — PHOSPHORUS: PHOSPHORUS: 2 mg/dL — AB (ref 2.5–4.6)

## 2014-12-23 LAB — MAGNESIUM: Magnesium: 1.9 mg/dL (ref 1.7–2.4)

## 2014-12-23 MED ORDER — INSULIN GLARGINE 100 UNIT/ML ~~LOC~~ SOLN
10.0000 [IU] | Freq: Every day | SUBCUTANEOUS | Status: DC
  Filled 2014-12-23: qty 0.1

## 2014-12-23 MED ORDER — INSULIN GLARGINE 100 UNIT/ML ~~LOC~~ SOLN
10.0000 [IU] | Freq: Every day | SUBCUTANEOUS | Status: DC
  Administered 2014-12-24: 10 [IU] via SUBCUTANEOUS
  Filled 2014-12-23: qty 0.1

## 2014-12-23 MED ORDER — NICOTINE 21 MG/24HR TD PT24
21.0000 mg | MEDICATED_PATCH | Freq: Every day | TRANSDERMAL | Status: DC
  Administered 2014-12-23 – 2014-12-26 (×4): 21 mg via TRANSDERMAL
  Filled 2014-12-23 (×4): qty 1

## 2014-12-23 MED ORDER — INSULIN GLARGINE 100 UNIT/ML ~~LOC~~ SOLN
10.0000 [IU] | Freq: Once | SUBCUTANEOUS | Status: AC
  Administered 2014-12-23: 10 [IU] via SUBCUTANEOUS
  Filled 2014-12-23: qty 0.1

## 2014-12-23 MED ORDER — STERILE WATER FOR INJECTION IV SOLN
INTRAVENOUS | Status: DC
  Administered 2014-12-23: 13:00:00 via INTRAVENOUS
  Filled 2014-12-23: qty 9.7

## 2014-12-23 MED ORDER — DEXTROSE 5 % IV SOLN
10.0000 mmol | Freq: Once | INTRAVENOUS | Status: AC
  Administered 2014-12-23: 10 mmol via INTRAVENOUS
  Filled 2014-12-23: qty 3.33

## 2014-12-23 NOTE — Progress Notes (Signed)
Utilization Review Completed.Meagan Cummings T7/09/2014  

## 2014-12-23 NOTE — Progress Notes (Signed)
eLink Physician-Brief Progress Note Patient Name: Meagan FairlyLeslie Cummings DOB: [birth year] MRN: [id number]   Date of Service  12/23/2014  HPI/Events of Note  RN says no order to got  To medical floor but nootes indicate tx to floor. RN says patient meets criteria  eICU Interventions  Order to move to floor written     Intervention Category Evaluation Type: Other  Marshall Roehrich 12/23/2014, 4:36 PM

## 2014-12-23 NOTE — Progress Notes (Signed)
Report given to [employer] Corporationkristie RN, pt transported via wheel chair by Toniann FailWendy NT and [employer] IncLeslie RN.  Victorino DikeLeslie Liany Mumpower RN

## 2014-12-23 NOTE — Progress Notes (Signed)
Pt's work called to give pt's family contact info. Pt's mother is unavailable at this time. Person answering phone stated pt's mother "left her phone at her house and they do not know where she is."  Contact info: Okey RegalCarol, pt's mother [phone]  Victorino DikeLeslie Tashyra Adduci RN

## 2014-12-23 NOTE — Progress Notes (Addendum)
Per Orpha BurKaty NP, send for pharmacy to verify and start lantus coverage now.   Meagan DikeLeslie Garhett Bernhard

## 2014-12-23 NOTE — Clinical Social Work Note (Addendum)
CSW Consult Acknowledged:   CSW received a consult to locate family. [place] [place] [phone], to send a patrol car to her residence. Due to the holiday the CSW can not contact the court house nor DSS. CSW [place] a [place] from [facility] [facility] [facility].    Addendum: CS[place] contacted by [place] [employer]. The officer indicated that he knows the pt's son and has been to the pt's house several times due to nonresponsive episodes. The officer reported that the pt is a diabetic.        Kinsey Cowsert, MSW, LCSWA [phone]

## 2014-12-23 NOTE — Progress Notes (Signed)
PULMONARY / CRITICAL CARE MEDICINE   Name: Meagan Cummings MRN: [id number] DOB: 05-01-1956    ADMISSION DATE:  12/22/2014   REFERRING MD :  EDP  CHIEF COMPLAINT:  AMS  INITIAL PRESENTATION: 59 yo female with hx DM presented 7/3 when family found her altered and "breathing funny". In ER found to have glucose >1300, acidotic with pH 6.9, hypothermic and PCCM called to admit to ICU for severe DKA.   STUDIES:   SIGNIFICANT EVENTS: 7/4- imporved , gap closed, off drip   HISTORY OF PRESENT ILLNESS:  59 yo female with hx DM presented 7/3 when family found her altered and "breathing funny". In ER found to have glucose >1300, acidotic with pH 6.9 and [facility]. No other history available as pt confused, no family available.    SUBJECTIVE:  Mildly confused but after pressing her she denies CP but reports mild SOB. Reports feeling better than when first arrived  VITAL SIGNS: Temp:  [94.9 F (34.9 C)-99.2 F (37.3 C)] 98.6 F (37 C) (07/04 0700) Pulse Rate:  [84-111] 84 (07/04 0700) Resp:  [11-28] 11 (07/04 0700) BP: (84-129)/(44-74) 91/63 mmHg (07/04 0700) SpO2:  [98 %-100 %] 100 % (07/04 0700) Weight:  [43.4 kg (95 lb 10.9 oz)] 43.4 kg (95 lb 10.9 oz) (07/04 0205)     INTAKE / OUTPUT:  Intake/Output Summary (Last 24 hours) at 12/23/14 0830 Last data filed at 12/23/14 0700  Gross per 24 hour  Intake 4914.57 ml  Output   2215 ml  Net 2699.57 ml    PHYSICAL EXAMINATION: General: Thin, frail female, NAD Neuro: Awake, confused, Ox3 HEENT: Mm dry, no JVD,  Cardiovascular: RRR, no murmurs Lungs: CTAB, regular rate Abdomen: Soft, mildly tender throughout, no R/G  Skin: Cool, dry  LABS:  CBC  Recent Labs Lab 12/22/14 0346  WBC 19.0*  HGB 10.9*  HCT 38.4  PLT 274   Coag's No results for input(s): APTT, INR in the last 168 hours. BMET  Recent Labs Lab 12/22/14 2217 12/23/14 0216 12/23/14 0545  NA 154* 155* 151*  K  4.0 3.8 3.7  CL 128* 129* 124*  CO2 18* 15* 14*  BUN 17 16 13   CREATININE 1.02* 0.94 0.92  GLUCOSE 142* 166* 285*   Electrolytes  Recent Labs Lab 12/22/14 0346 12/22/14 0718  12/22/14 2217 12/23/14 0216 12/23/14 0545  CALCIUM 8.6* 8.4*  < > 8.8* 8.8* 8.5*  MG 2.9* 2.8*  --   --   --  1.9  PHOS 10.9* 8.2*  --   --   --  2.0*  < > = values in this interval not displayed. Sepsis Markers  Recent Labs Lab 12/22/14 0408 12/22/14 0617 12/22/14 0825 12/22/14 1205 12/23/14 0545  LATICACIDVEN 4.37* 4.36*  --  2.5*  --   PROCALCITON  --   --  0.56  --  1.04   ABG  Recent Labs Lab 12/22/14 0657  PHART 6.917*  PCO2ART 13.9*  PO2ART 144.0*   Liver Enzymes  Recent Labs Lab 12/22/14 0346  AST 22  ALT 27  ALKPHOS 96  BILITOT 1.3*  ALBUMIN 3.2*   Cardiac Enzymes  Recent Labs Lab 12/22/14 1835 12/23/14 0017 12/23/14 0545  TROPONINI <0.03 <0.03 0.03   Glucose  Recent Labs Lab 12/22/14 1953 12/22/14 2051 12/22/14 2156 12/22/14 2300 12/22/14 2336 12/23/14 0442  GLUCAP 96 114* 128* 149* 170* 245*    Imaging No results found.   ASSESSMENT / PLAN:  PULMONARY  Improved respiratory status s/p respiratory compensation for severe metabolic acidosis  P:  rx DKA as below  Supplemental O2 as needed  Add IS  CARDIOVASCULAR Tachycardia  Hypotension - r/t dehydration, DKA  P:  Troponin neg x3 consider dc tele  RENAL Severe anion gap acidosis - DKA  - closed NONAG (saline, RTA 4) Hyperphosphatemia  Hypermagnesemia  AKI  Dehydration  P:  Gap closed at 13 Lactate trending down Avoid saline May need bicarb Assess UA for work up rta q4 BMET -   Mg wnl, phos 2, improved Phos replacement  GASTROINTESTINAL Protein calorie malnutrition  P:  DM diet today PPI  Dietitian consult - DM education  Supplements when taking PO   HEMATOLOGIC Anemia - mild. Macrocytic P:  F/u cbc  Anemia panel  SQ heparin until  ambualtion  INFECTIOUS NO obvious source infection - but DKA and leukocytosis so will treat empirically and pan culture.  Leukocytosis  P:  BCx2 7/3>>> UC 7/3>>> Vanc/Zosyn in ER 7/3 Ceftriaxone 7/3>>>7/4  Treat empirically for now  Pan culture  Check pct neg, neg UA, neg pcxr - dc all abx   ENDOCRINE Severe DKA - suspect noncompliance as trigger for DKA.- resolved DM  P:  S/p insulin drip Initiate lantus, plus SSI q4 BMET  DM education prior to d/c  Empiric abx for now   NEUROLOGIC AMS - r/t DKA. Improving slowly  P:  Avoid sedation  Supportive care    FAMILY  - Updates: no family available on 7/3, will try to reach them today to establish baseline  - Inter-disciplinary family meet or Palliative Care meeting due by: 7/10    Alyssa A. Kennon Rounds MD, MS Family Medicine Resident PGY-1 Pager [phone]   12/23/2014, 8:30 AM   STAFF NOTE: I, Rory Percy, MD FACP have personally reviewed patient's available data, including medical history, events of note, physical examination and test results as part of my evaluation. I have discussed with resident/NP and other care providers such as pharmacist, RN and RRT. In addition, I personally evaluated patient and elicited key findings of: confused, nonag noted, gap resolved, add bicarb x 1 liter, chem in am , phos supp, correct NA, work up RTA, dc abx, to triad, floor  Mcarthur Rossetti. Tyson Alias, MD, FACP Pgr: [phone] [facility] 12/23/2014 10:53 AM

## 2014-12-24 DIAGNOSIS — E1069 Type 1 diabetes mellitus with other specified complication: Secondary | ICD-10-CM

## 2014-12-24 DIAGNOSIS — E876 Hypokalemia: Secondary | ICD-10-CM

## 2014-12-24 DIAGNOSIS — E870 Hyperosmolality and hypernatremia: Secondary | ICD-10-CM

## 2014-12-24 DIAGNOSIS — G9340 Encephalopathy, unspecified: Secondary | ICD-10-CM

## 2014-12-24 LAB — BASIC METABOLIC PANEL
Anion gap: 6 (ref 5–15)
CO2: 21 mmol/L — ABNORMAL LOW (ref 22–32)
Calcium: 8.2 mg/dL — ABNORMAL LOW (ref 8.9–10.3)
Chloride: 120 mmol/L — ABNORMAL HIGH (ref 101–111)
Creatinine, Ser: 0.6 mg/dL (ref 0.44–1.00)
GFR calc Af Amer: >60 mL/min (ref >60)
GFR calc non Af Amer: >60 mL/min (ref >60)
GLUCOSE: 235 mg/dL — AB (ref 65–99)
POTASSIUM: 2.9 mmol/L — AB (ref 3.5–5.1)
Sodium: 147 mmol/L — ABNORMAL HIGH (ref 135–145)

## 2014-12-24 LAB — MAGNESIUM: Magnesium: 1.6 mg/dL — ABNORMAL LOW (ref 1.7–2.4)

## 2014-12-24 LAB — GLUCOSE, CAPILLARY
GLUCOSE-CAPILLARY: 137 mg/dL — AB (ref 65–99)
GLUCOSE-CAPILLARY: 223 mg/dL — AB (ref 65–99)
GLUCOSE-CAPILLARY: 254 mg/dL — AB (ref 65–99)
GLUCOSE-CAPILLARY: 129 mg/dL — AB (ref 65–99)
Glucose-Capillary: 331 mg/dL — ABNORMAL HIGH (ref 65–99)

## 2014-12-24 LAB — TROPONIN I
TROPONIN I: 0.03 ng/mL (ref <0.031)
Troponin I: <0.03 ng/mL (ref <0.031)
Troponin I: <0.03 ng/mL (ref <0.031)

## 2014-12-24 LAB — HEMOGLOBIN A1C
Hgb A1c MFr Bld: 13.2 % — ABNORMAL HIGH (ref 4.8–5.6)
Mean Plasma Glucose: 332 mg/dL

## 2014-12-24 LAB — PHOSPHORUS: Phosphorus: 2.2 mg/dL — ABNORMAL LOW (ref 2.5–4.6)

## 2014-12-24 LAB — PROCALCITONIN: Procalcitonin: 0.44 ng/mL

## 2014-12-24 MED ORDER — FREE WATER
200.0000 mL | Status: AC
  Administered 2014-12-24 – 2014-12-25 (×6): 200 mL via ORAL

## 2014-12-24 MED ORDER — LIVING WELL WITH DIABETES BOOK
Freq: Once | Status: AC
  Administered 2014-12-24: 17:00:00
  Filled 2014-12-24: qty 1

## 2014-12-24 MED ORDER — INSULIN GLARGINE 100 UNIT/ML ~~LOC~~ SOLN
15.0000 [IU] | Freq: Every day | SUBCUTANEOUS | Status: DC
  Administered 2014-12-25 – 2014-12-26 (×2): 15 [IU] via SUBCUTANEOUS
  Filled 2014-12-24 (×2): qty 0.15

## 2014-12-24 MED ORDER — POTASSIUM CHLORIDE CRYS ER 20 MEQ PO TBCR
40.0000 meq | EXTENDED_RELEASE_TABLET | ORAL | Status: AC
  Administered 2014-12-24 (×2): 40 meq via ORAL
  Filled 2014-12-24 (×2): qty 2

## 2014-12-24 MED ORDER — MAGNESIUM SULFATE 2 GM/50ML IV SOLN
2.0000 g | Freq: Once | INTRAVENOUS | Status: AC
  Administered 2014-12-24: 2 g via INTRAVENOUS
  Filled 2014-12-24: qty 50

## 2014-12-24 MED ORDER — DEXTROSE 5 % IV SOLN
10.0000 mmol | Freq: Once | INTRAVENOUS | Status: AC
  Administered 2014-12-24: 10 mmol via INTRAVENOUS
  Filled 2014-12-24: qty 3.33

## 2014-12-24 MED ORDER — INSULIN ASPART 100 UNIT/ML ~~LOC~~ SOLN
0.0000 [IU] | Freq: Three times a day (TID) | SUBCUTANEOUS | Status: DC
  Administered 2014-12-24: 8 [IU] via SUBCUTANEOUS
  Administered 2014-12-24: 11 [IU] via SUBCUTANEOUS
  Administered 2014-12-25: 5 [IU] via SUBCUTANEOUS
  Administered 2014-12-25: 11 [IU] via SUBCUTANEOUS
  Administered 2014-12-26: 15 [IU] via SUBCUTANEOUS
  Administered 2014-12-26: 5 [IU] via SUBCUTANEOUS

## 2014-12-24 MED ORDER — SODIUM CHLORIDE 0.45 % IV SOLN
INTRAVENOUS | Status: AC
  Administered 2014-12-24: 20:00:00 via INTRAVENOUS

## 2014-12-24 MED ORDER — INSULIN STARTER KIT- PEN NEEDLES (ENGLISH)
1.0000 | Freq: Once | Status: AC
  Administered 2014-12-24: 1
  Filled 2014-12-24: qty 1

## 2014-12-24 MED ORDER — INSULIN ASPART 100 UNIT/ML ~~LOC~~ SOLN
0.0000 [IU] | Freq: Every day | SUBCUTANEOUS | Status: DC
  Administered 2014-12-25: 3 [IU] via SUBCUTANEOUS

## 2014-12-24 NOTE — Progress Notes (Signed)
PROGRESS NOTE    Meagan Cummings WIO:[id number] DOB: 1955/11/18 DOA: 12/22/2014 PCP: No primary care provider on file. Dr. Hardin Negus in [place]  HPI/Brief narrative 59 year old female with history of DM 1 with peripheral neuropathy, admitted by CCM to ICU on 12/22/14 with complaints of altered mental status and "breathing funny". In ED, found to have glucose >1300, pH 6.9, hypothermic and severe DKA. Patient claims compliance to her Levemir and NovoLog insulins. States that her home blood sugars are usually in the 280s. After stabilized, CCM transferred patient's care to the floor and [place] service on 12/24/14   Assessment/Plan:  DKA in type I DM not at goal - Treated in usual fashion with aggressive volume resuscitation and IV insulin per DKA protocol - Resolved and patient was transitioned to Lantus and NovoLog - Probably noncompliant.  Poorly controlled type I DM - Check A1c - Increase Lantus to 15 units daily and continue NovoLog SSI - Consulted diabetes coordinator -DC D5 IV fluids   NAG metabolic acidosis - Anion gap closed to 13 on 7/4. - This was likely secondary to saline hydration and RTA 4 - Treated with bicarbonate drip which is probably contributing to hypernatremia, hyperchloremia and bicarbonate has normalized. - DC bicarbonate drip and encourage free water intake  Hypernatremia and hyperchloremia - Secondary to dehydration, saline and bicarbonate resuscitation - Discontinue bicarbonate drip. Place on half-normal saline hydration. Encouraged free water intake. Follow BMP  Hypomagnesemia and hypophosphatemia - Replace and follow  Hypokalemia - Replace and follow  Hypotension/tachycardia/dehydration - Secondary to DKA. Resolved - Troponin 5: Negative  Acute kidney injury - Secondary to dehydration. Resolved  Protein calorie malnutrition - Dietitian consultation  Macrocytic anemia - Follow CBCs  Acute encephalopathy - Secondary to DKA. Seems to  have resolved.  Leukocytosis/SIRS on admission - On admission WBC 19 K. Likely stress response from DKA. - Blood cultures 4 and urine culture 1: Negative to date. - Empirically started antibiotics were discontinued. - Patient met criteria for SIRS on admission-noninfectious etiology related to dehydration from DKA.   DVT prophylaxis: Subcutaneous heparin  Code Status: Full  Family Communication: None at bedside  Disposition Plan: DC home when medically stable, possibly in 2-3 days  Consultants:  CCM-signed off  Procedures:  None    Antibiotics:  Off all antibiotics   Subjective: Denies complaints. Denies weakness, dizziness, lightheadedness, pain, nausea or vomiting.  Objective: Filed Vitals:   12/24/14 0436 12/24/14 1012 12/24/14 1300 12/24/14 1420  BP: 118/62 133/79 128/65 100/73  Pulse: 82 74 82 88  Temp: 97.9 F (36.6 C) 98.2 F (36.8 C) 98 F (36.7 C) 98.6 F (37 C)  TempSrc: Oral Oral Oral Oral  Resp: _0 Height:      Weight:      SpO2: 100% 100% 100% 100%    Intake/Output Summary (Last 24 hours) at 12/24/14 1705 Last data filed at 12/24/14 1630  Gross per 24 hour  Intake   1040 ml  Output     25 ml  Net   1015 ml   Filed Weights   12/22/14 0633 12/22/14 0800 12/23/14 0205  Weight: 49.896 kg (110 lb) 42.9 kg (94 lb 9.2 oz) 43.4 kg (95 lb 10.9 oz)     Exam:  General exam: Small built and thinly nourished pleasant middle-aged female ambulating comfortably in the room.  Respiratory system: Clear. No increased work of breathing. Cardiovascular system: S1 & S2 heard, RRR. No JVD, murmurs, gallops, clicks or pedal edema. Gastrointestinal system:  Abdomen is nondistended, soft and nontender. Normal bowel sounds heard. Central nervous system: Alert and oriented. No focal neurological deficits. Extremities: Symmetric 5 x 5 power.   Data Reviewed: Basic Metabolic Panel:  Recent Labs Lab 12/22/14 0346 12/22/14 0718  12/22/14 2217  12/23/14 0216 12/23/14 0545 12/23/14 1035 12/24/14 0700  NA 140 148*  < > 154* 155* 151* 148* 147*  K >7.5* 5.6*  < > 4.0 3.8 3.7 3.4* 2.9*  CL 110 118*  < > 128* 129* 124* 126* 120*  CO2 <5* <5*  < > 18* 15* 14* 13* 21*  GLUCOSE 1386* 1114*  < > 142* 166* 285* 307* 235*  BUN 35* 35*  < > _0 <5*  CREATININE 2.87* 2.51*  < > 1.02* 0.94 0.92 0.95 0.60  CALCIUM 8.6* 8.4*  < > 8.8* 8.8* 8.5* 8.5* 8.2*  MG 2.9* 2.8*  --   --   --  1.9  --  1.6*  PHOS 10.9* 8.2*  --   --   --  2.0*  --  2.2*  < > = values in this interval not displayed. Liver Function Tests:  Recent Labs Lab 12/22/14 0346  AST 22  ALT 27  ALKPHOS 96  BILITOT 1.3*  PROT 5.6*  ALBUMIN 3.2*   No results for input(s): LIPASE, AMYLASE in the last 168 hours. No results for input(s): AMMONIA in the last 168 hours. CBC:  Recent Labs Lab 12/22/14 0346  WBC 19.0*  NEUTROABS 15.0*  HGB 10.9*  HCT 38.4  MCV 101.6*  PLT 274   Cardiac Enzymes:  Recent Labs Lab 12/23/14 1130 12/23/14 2132 12/24/14 0018 12/24/14 0700 12/24/14 1415  TROPONINI <0.03 <0.03 <0.03 <0.03 0.03   BNP (last 3 results) No results for input(s): PROBNP in the last 8760 hours. CBG:  Recent Labs Lab 12/23/14 1938 12/23/14 2338 12/24/14 0340 12/24/14 0721 12/24/14 1125  GLUCAP 194* 138* 137* 223* 254*    Recent Results (from the past 240 hour(s))  Culture, blood (routine x 2)     Status: None (Preliminary result)   Collection Time: 12/22/14  3:20 AM  Result Value Ref Range Status   Specimen Description BLOOD RIGHT ANTECUBITAL  Final   Special Requests BOTTLES DRAWN AEROBIC ONLY 4CC  Final   Culture NO GROWTH 2 DAYS  Final   Report Status PENDING  Incomplete  Culture, blood (routine x 2)     Status: None (Preliminary result)   Collection Time: 12/22/14  3:30 AM  Result Value Ref Range Status   Specimen Description BLOOD RIGHT HAND  Final   Special Requests BOTTLES DRAWN AEROBIC ONLY 3CC  Final   Culture NO GROWTH 2  DAYS  Final   Report Status PENDING  Incomplete  Urine culture     Status: None   Collection Time: 12/22/14  6:21 AM  Result Value Ref Range Status   Specimen Description URINE, RANDOM  Final   Special Requests NONE  Final   Culture NO GROWTH 1 DAY  Final   Report Status 12/23/2014 FINAL  Final  MRSA PCR Screening     Status: None   Collection Time: 12/22/14  7:59 AM  Result Value Ref Range Status   MRSA by PCR NEGATIVE NEGATIVE Final    Comment:        The GeneXpert MRSA Assay (FDA approved for NASAL specimens only), is one component of a comprehensive MRSA colonization surveillance program. It is not intended to diagnose MRSA infection nor to  guide or monitor treatment for MRSA infections.   Blood culture (routine x 2)     Status: None (Preliminary result)   Collection Time: 12/22/14  9:28 AM  Result Value Ref Range Status   Specimen Description BLOOD RIGHT HAND  Final   Special Requests BOTTLES DRAWN AEROBIC ONLY 3CC  Final   Culture NO GROWTH 2 DAYS  Final   Report Status PENDING  Incomplete  Blood culture (routine x 2)     Status: None (Preliminary result)   Collection Time: 12/22/14  9:46 AM  Result Value Ref Range Status   Specimen Description BLOOD LEFT HAND  Final   Special Requests BOTTLES DRAWN AEROBIC ONLY 1CC  Final   Culture NO GROWTH 2 DAYS  Final   Report Status PENDING  Incomplete           Studies: No results found.      Scheduled Meds: . free water  200 mL Oral Q4H  . heparin  5,000 Units Subcutaneous 3 times per day  . insulin aspart  0-15 Units Subcutaneous TID WC  . insulin aspart  0-5 Units Subcutaneous QHS  . insulin glargine  10 Units Subcutaneous Daily  . insulin starter kit- pen needles  1 kit Other Once  . living well with diabetes book   Does not apply Once  . nicotine  21 mg Transdermal Daily   Continuous Infusions:    Active Problems:   DKA (diabetic ketoacidoses)   Acute renal failure syndrome   Diabetic ketoacidosis  associated with other specified diabetes mellitus    Time spent: 40 minutes.    Vernell Leep, MD, FACP, FHM. [facility] Pager [phone]  If 7PM-7AM, please contact night-coverage [email or web address] Password [email or web address] 12/24/2014, 5:05 PM    LOS: 2 days

## 2014-12-24 NOTE — Clinical Social Work Note (Signed)
[facility] The bedside RN ws able to obtain the pt's mother phone number. CSW called registration to have them add the pt's mother contact information to the facesheet. At this time there are no additional needs. CSW will sign off.   Meagan Cummings, MSW, LCSWA [phone]

## 2014-12-24 NOTE — Clinical Documentation Improvement (Signed)
Supporting Information: Sepsis noted per 7/03 Pharmacy consult for dosing IV Vancomycin and IV Zosyn.  Labs: 7/03:  Lactic acid: 4.37. 7/03:  Lactic acid: 4.36.   Possible Clinical Condition: . Bacteremia (positive blood cultures only) . Sepsis with UTI, versus UTI only . Sepsis-specify causative organism if known . Sepsis due to: --Device --Implant --Graft --Infusion --Abortion . Severe sepsis-sepsis with organ dysfunction --Specify organ dysfunction Respiratory failure Encephalopathy Acute kidney failure Other (specify) . SIRS (Systemic Inflammatory Response Syndrome --With or without organ dysfunction . Document septic shock if present . Document any associated diagnoses/conditions     Thank Gabriel CirriYou,  Enez Monahan Mathews-Bethea,RN,BSN,Clinical Documentation Specialist [phone] [email or web address]

## 2014-12-24 NOTE — Progress Notes (Addendum)
Inpatient Diabetes Program Recommendations  AACE/ADA: New Consensus Statement on Inpatient Glycemic Control (2013)  Target Ranges:  Prepandial:   less than 140 mg/dL      Peak postprandial:   less than 180 mg/dL (1-2 hours)      Critically ill patients:  140 - 180 mg/dL   Consult received regarding pt's admission in DKA. Home regimen reveals pt takes levemir 8 units bid and 5-12 units humalog tidwc. Will talk with patient asap. Ordered inpatient education per RN's with dm education book, system network videos, dietician consult. According to her weight, pt's basal needs are at least 15 units total per day. Please increase basal insulin to 15 units. Agree with home basal insulin regimen of levemir of 16 units per day (8 units bid) and correction/meal coverage. Thank you  , RN, MSN, CDE   AD-spoke with patient regarding circumstances possibly leading up to the DKA status. Pt states she doesn't know how it happened-has had dm for 6 years; states she has had DKA in the past,not specific as to when or how often. Pt states that one of her insulin vials was cloudy. She had been keeping it in the refrigerator, but she states that her 'power went off' thus, possibly her insulin got too hot (?). She is not able to tell me which insulin it was. I suggested she use an insulin pen rather than the vials, as they are so much easier to keep and use. (still will need refrigeration for those unopened). Have ordered an insulin pen starter kit as pt states she use to use one and would like to again. Ordered for RN's or CNA's to assist pt with watching the insulin pen video. Pt also needs reinforcement of survival skills. AC    Diabetes Inpatient Program Office: [phone] Pager: [phone] 8:00 am to 5:00 pm           

## 2014-12-25 DIAGNOSIS — J438 Other emphysema: Secondary | ICD-10-CM

## 2014-12-25 DIAGNOSIS — E1310 Other specified diabetes mellitus with ketoacidosis without coma: Secondary | ICD-10-CM

## 2014-12-25 DIAGNOSIS — N179 Acute kidney failure, unspecified: Secondary | ICD-10-CM

## 2014-12-25 LAB — GLUCOSE, CAPILLARY
Glucose-Capillary: 337 mg/dL — ABNORMAL HIGH (ref 65–99)
Glucose-Capillary: 235 mg/dL — ABNORMAL HIGH (ref 65–99)
Glucose-Capillary: 98 mg/dL (ref 65–99)
Glucose-Capillary: 288 mg/dL — ABNORMAL HIGH (ref 65–99)

## 2014-12-25 LAB — CBC
HCT: 27 % — ABNORMAL LOW (ref 36.0–46.0)
Hemoglobin: 9.2 g/dL — ABNORMAL LOW (ref 12.0–15.0)
MCH: 28.5 pg (ref 26.0–34.0)
MCHC: 34.1 g/dL (ref 30.0–36.0)
MCV: 83.6 fL (ref 78.0–100.0)
Platelets: 131 10*3/uL — ABNORMAL LOW (ref 150–400)
RBC: 3.23 MIL/uL — ABNORMAL LOW (ref 3.87–5.11)
RDW: 14.4 % (ref 11.5–15.5)
WBC: 5.1 10*3/uL (ref 4.0–10.5)

## 2014-12-25 LAB — MAGNESIUM: Magnesium: 2 mg/dL (ref 1.7–2.4)

## 2014-12-25 LAB — BASIC METABOLIC PANEL
Anion gap: 4 — ABNORMAL LOW (ref 5–15)
BUN: <5 mg/dL — ABNORMAL LOW (ref 6–20)
CO2: 23 mmol/L (ref 22–32)
CREATININE: 0.54 mg/dL (ref 0.44–1.00)
Calcium: 8.1 mg/dL — ABNORMAL LOW (ref 8.9–10.3)
Chloride: 115 mmol/L — ABNORMAL HIGH (ref 101–111)
GFR calc Af Amer: >60 mL/min (ref >60)
GFR calc non Af Amer: >60 mL/min (ref >60)
Glucose, Bld: 300 mg/dL — ABNORMAL HIGH (ref 65–99)
Potassium: 4.2 mmol/L (ref 3.5–5.1)
Sodium: 142 mmol/L (ref 135–145)

## 2014-12-25 LAB — HEMOGLOBIN A1C
Hgb A1c MFr Bld: 13.3 % — ABNORMAL HIGH (ref 4.8–5.6)
Mean Plasma Glucose: 335 mg/dL

## 2014-12-25 LAB — PHOSPHORUS: Phosphorus: 2.8 mg/dL (ref 2.5–4.6)

## 2014-12-25 MED ORDER — TIOTROPIUM BROMIDE MONOHYDRATE 18 MCG IN CAPS
18.0000 ug | ORAL_CAPSULE | Freq: Every day | RESPIRATORY_TRACT | Status: DC
  Filled 2014-12-25: qty 5

## 2014-12-25 MED ORDER — ARIPIPRAZOLE 2 MG PO TABS
2.0000 mg | ORAL_TABLET | Freq: Every day | ORAL | Status: DC
  Administered 2014-12-25 – 2014-12-26 (×2): 2 mg via ORAL
  Filled 2014-12-25 (×2): qty 1

## 2014-12-25 MED ORDER — GUAIFENESIN ER 600 MG PO TB12
600.0000 mg | ORAL_TABLET | Freq: Two times a day (BID) | ORAL | Status: DC
  Administered 2014-12-25 – 2014-12-26 (×3): 600 mg via ORAL
  Filled 2014-12-25 (×3): qty 1

## 2014-12-25 MED ORDER — GABAPENTIN 100 MG PO CAPS
100.0000 mg | ORAL_CAPSULE | Freq: Once | ORAL | Status: AC
  Administered 2014-12-25: 100 mg via ORAL
  Filled 2014-12-25: qty 1

## 2014-12-25 MED ORDER — GLUCERNA SHAKE PO LIQD
237.0000 mL | Freq: Three times a day (TID) | ORAL | Status: DC
  Administered 2014-12-25 – 2014-12-26 (×2): 237 mL via ORAL

## 2014-12-25 MED ORDER — IPRATROPIUM-ALBUTEROL 0.5-2.5 (3) MG/3ML IN SOLN
3.0000 mL | Freq: Three times a day (TID) | RESPIRATORY_TRACT | Status: DC
  Administered 2014-12-25 (×2): 3 mL via RESPIRATORY_TRACT
  Filled 2014-12-25: qty 3

## 2014-12-25 MED ORDER — FLUTICASONE PROPIONATE 50 MCG/ACT NA SUSP
1.0000 | Freq: Every day | NASAL | Status: DC
  Administered 2014-12-25 – 2014-12-26 (×2): 1 via NASAL
  Filled 2014-12-25: qty 16

## 2014-12-25 MED ORDER — IPRATROPIUM-ALBUTEROL 0.5-2.5 (3) MG/3ML IN SOLN
RESPIRATORY_TRACT | Status: AC
Start: 2014-12-25 — End: 2014-12-26
  Filled 2014-12-25: qty 3

## 2014-12-25 MED ORDER — GABAPENTIN 300 MG PO CAPS
300.0000 mg | ORAL_CAPSULE | Freq: Every day | ORAL | Status: DC
  Administered 2014-12-25: 300 mg via ORAL
  Filled 2014-12-25: qty 1

## 2014-12-25 MED ORDER — CITALOPRAM HYDROBROMIDE 20 MG PO TABS
10.0000 mg | ORAL_TABLET | Freq: Every day | ORAL | Status: DC
  Administered 2014-12-25 – 2014-12-26 (×2): 10 mg via ORAL
  Filled 2014-12-25 (×2): qty 1

## 2014-12-25 MED ORDER — ENOXAPARIN SODIUM 40 MG/0.4ML ~~LOC~~ SOLN
40.0000 mg | SUBCUTANEOUS | Status: DC
  Administered 2014-12-25: 40 mg via SUBCUTANEOUS
  Filled 2014-12-25: qty 0.4

## 2014-12-25 NOTE — Progress Notes (Signed)
Inpatient Diabetes Program Recommendations  AACE/ADA: New Consensus Statement on Inpatient Glycemic Control (2013)  Target Ranges:  Prepandial:   less than 140 mg/dL      Peak postprandial:   less than 180 mg/dL (1-2 hours)      Critically ill patients:  140 - 180 mg/dL   Demonstrated how to use the insulin pen and had her demonstrate back to me using 'teach-back' method. Pt very adept at using the pen.  Discussed with her regarding checking her glucose am, mid-day, and early evening and to use her correction scale/meal coverage three times a day. Pt states that sometimes she did not always eat breakfast and would not check her glucose until she ate mid-day or afternoon. States sometimes she would take her novolog if she was going to eat 'enough'. Pt also states that she was never told to take her basal levemir even if she was not eating. Pt also states she sometimes would pre-draw her insulin into a syringe ahead of time to take with her to work. Alerted her that the insulin could not be pre-drawn into a syringe (like some of the older insulins could be and maintain effectiveness). Reviewed signs and symptoms of high and low glucose, when to eat and when to check her glucose and treat with insulin. Pt would like to have an out-patient education appt follow [facility]. I can order and get her an appt before discharge if desired.  Pt is eager to learn and very receptive and understanding of instructions and helpful guidelines. Will revisit in am prior to discharge.  Thank you Lenor CoffinAnn Solara Goodchild, RN, MSN, CDE  Diabetes Inpatient Program Office: [phone] Pager: [phone] 8:00 am to 5:00 pm

## 2014-12-25 NOTE — Progress Notes (Signed)
Initial Nutrition Assessment  DOCUMENTATION CODES:  Underweight  INTERVENTION:  Glucerna Shake po TID, each supplement provides 220 kcal and 10 grams of protein  NUTRITION DIAGNOSIS:  Limited adherence to nutrition-related recommendations related to social / environmental circumstances as evidenced by  (Hgb A1c: 13).   GOAL:  Patient will meet greater than or equal to 90% of their needs  MONITOR:  PO intake, Supplement acceptance, Labs, Weight trends, Skin, I & O's  REASON FOR ASSESSMENT:  Consult Diet education  ASSESSMENT: 59 yo female with hx DM presented 7/3 when family found her altered and "breathing funny". In ER found to have glucose >1300, acidotic with pH 6.9, hypothermic and PCCM called to admit to ICU for severe DKA.   Pt admitted for DKA.   Pt was unavailable at time of visit. Chart reviewed. Pt is currently on a carb modified diet; PO: 75%.   Reviewed DM coordinator note from 12/24/14- suspect noncompliance and pt potentially using insulin in vials that were not stored in a cool place. DM coordinator suggesting insulin pens.   Pt is underweight, however, no previous wt hx available to assess wt changes. Suspect potential for weight loss and malnutriiton due to poorly controlled DM (Hgb A1c 13). Will add Glucerna shake for additional nutrition support.   Provided "Carb Counting for People With Diabetes" handout at bedside for pt to review.   Height:  Ht Readings from Last 1 Encounters:  12/22/14 5\' 2"  (1.575 m)    Weight:  Wt Readings from Last 1 Encounters:  12/23/14 95 lb 10.9 oz (43.4 kg)    Ideal Body Weight:  50 kg  Wt Readings from Last 10 Encounters:  12/23/14 95 lb 10.9 oz (43.4 kg)    BMI:  Body mass index is 17.5 kg/(m^2).  Estimated Nutritional Needs:  Kcal:  1300-1500  Protein:  50-60 grams  Fluid:  1.3-1.5 L  Skin:  Reviewed, no issues  Diet Order:  Diet Carb Modified Fluid consistency:: Thin; Room service appropriate?:  Yes  EDUCATION NEEDS:  Education needs addressed   Intake/Output Summary (Last 24 hours) at 12/25/14 1542 Last data filed at 12/25/14 1412  Gross per 24 hour  Intake    840 ml  Output      0 ml  Net    840 ml    Last BM:  12/24/14  Estelita Iten A. Mayford KnifeWilliams, RD, LDN, CDE Pager: [phone] After hours Pager: [phone]

## 2014-12-25 NOTE — Progress Notes (Signed)
PROGRESS NOTE    Meagan Cummings RUE:[id number] DOB: 1955-12-07 DOA: 12/22/2014 PCP: No primary care provider on file. Dr. Vear Clock in [place]  HPI/Brief narrative 59 year old female with history of DM 1 with peripheral neuropathy, admitted by CCM to ICU on 12/22/14 with complaints of altered mental status and "breathing funny". In ED, found to have glucose >1300, pH 6.9, hypothermic and severe DKA. Patient claims compliance to her Levemir and NovoLog insulins. States that her home blood sugars are usually in the 280s. After stabilized, CCM transferred patient's care to the floor and [facility] service on 12/24/14   Assessment/Plan:  DKA in type I DM not at goal - Treated in usual fashion with aggressive volume resuscitation and IV insulin per DKA protocol - Resolved and patient was transitioned to Lantus and NovoLog - Probably noncompliant. Reported run out of insulin for a week. Request levemir and novolog flex pen at discharge  Poorly controlled type I DM -  A1c13.3 - Increase Lantus to 15 units daily and continue NovoLog SSI - Consulted diabetes coordinator -DC D5 IV fluids   NAG metabolic acidosis - Anion gap closed to 13 on 7/4. - This was likely secondary to saline hydration and RTA 4 - Treated with bicarbonate drip which is probably contributing to hypernatremia, hyperchloremia and bicarbonate has normalized. - DC bicarbonate drip and encourage free water intake  Hypernatremia and hyperchloremia - Secondary to dehydration, saline and bicarbonate resuscitation - Discontinue bicarbonate drip. Place on half-normal saline hydration. Encouraged free water intake. Follow BMP -off lvf, encourage oral intake  Hypomagnesemia and hypophosphatemia - Replace and follow  Hypokalemia - Replace and follow  Hypotension/tachycardia/dehydration - Secondary to DKA. Resolved - Troponin 5: Negative  Acute kidney injury - Secondary to dehydration. Resolved  Protein calorie  malnutrition - Dietitian consultation  Macrocytic anemia - Follow CBCs  Acute encephalopathy - Secondary to DKA. Seems to have resolved.  Leukocytosis - On admission WBC 19 K. Likely stress response from DKA. - Blood cultures 4 and urine culture 1: Negative to date. - Empirically started antibiotics were discontinued.  Borderline blood pressure: denies dizziness, will check orthostatic vital sign, encourage oral intake.  Copd with intermittent dry cough, mild wheezing on exam: cxr no acute findings, duoneb, mucinex, also reported nasal congestion, flonase.  Substance use: urine tox + cocaine Smoking cessation: on nicotine patch.    DVT prophylaxis: was on Subcutaneous heparin, changed to lovenox when cr improved on 7/6, encourage ambulation  Code Status: Full  Family Communication: None at bedside  Disposition Plan: DC home when medically stable, possibly in 2-3 days  Consultants:  CCM-signed off  Procedures:  None    Antibiotics:  Off all antibiotics   Subjective: Intermittent dry cough, reported nasal congestion. Denies weakness, dizziness, lightheadedness, pain, nausea or vomiting.  Objective: Filed Vitals:   12/24/14 2220 12/25/14 0203 12/25/14 0638 12/25/14 0950  BP: 89/60 91/62 101/71 85/59  Pulse: 75 74 65 75  Temp: 99.1 F (37.3 C) 98.1 F (36.7 C) 98.4 F (36.9 C) 98.9 F (37.2 C)  TempSrc: Oral   Oral  Resp: Height:      Weight:      SpO2: 100% 95% 100% 100%    Intake/Output Summary (Last 24 hours) at 12/25/14 1351 Last data filed at 12/25/14 0900  Gross per 24 hour  Intake    960 ml  Output      0 ml  Net    960 ml   [employer]  12/22/14 1610 12/22/14 0800 12/23/14 0205  Weight: 49.896 kg (110 lb) 42.9 kg (94 lb 9.2 oz) 43.4 kg (95 lb 10.9 oz)     Exam:  General exam: Small built and thinly nourished pleasant middle-aged female ambulating comfortably in the room.  Respiratory system: Clear. No increased work of  breathing. Cardiovascular system: S1 & S2 heard, RRR. No JVD, murmurs, gallops, clicks or pedal edema. Gastrointestinal system: Abdomen is nondistended, soft and nontender. Normal bowel sounds heard. Central nervous system: Alert and oriented. No focal neurological deficits. Extremities: Symmetric 5 x 5 power.   Data Reviewed: Basic Metabolic Panel:  Recent Labs Lab 12/22/14 0346 12/22/14 0718  12/23/14 0216 12/23/14 0545 12/23/14 1035 12/24/14 0700 12/25/14 0430  NA 140 148*  < > 155* 151* 148* 147* 142  K >7.5* 5.6*  < > 3.8 3.7 3.4* 2.9* 4.2  CL 110 118*  < > 129* 124* 126* 120* 115*  CO2 <5* <5*  < > 15* 14* 13* 21* 23  GLUCOSE 1386* 1114*  < > 166* 285* 307* 235* 300*  BUN 35* 35*  < > 16 13 11  <5* <5*  CREATININE 2.87* 2.51*  < > 0.94 0.92 0.95 0.60 0.54  CALCIUM 8.6* 8.4*  < > 8.8* 8.5* 8.5* 8.2* 8.1*  MG 2.9* 2.8*  --   --  1.9  --  1.6* 2.0  PHOS 10.9* 8.2*  --   --  2.0*  --  2.2* 2.8  < > = values in this interval not displayed. Liver Function Tests:  Recent Labs Lab 12/22/14 0346  AST 22  ALT 27  ALKPHOS 96  BILITOT 1.3*  PROT 5.6*  ALBUMIN 3.2*   No results for input(s): LIPASE, AMYLASE in the last 168 hours. No results for input(s): AMMONIA in the last 168 hours. CBC:  Recent Labs Lab 12/22/14 0346 12/25/14 0430  WBC 19.0* 5.1  NEUTROABS 15.0*  --   HGB 10.9* 9.2*  HCT 38.4 27.0*  MCV 101.6* 83.6  PLT 274 131*   Cardiac Enzymes:  Recent Labs Lab 12/23/14 1130 12/23/14 2132 12/24/14 0018 12/24/14 0700 12/24/14 1415  TROPONINI <0.03 <0.03 <0.03 <0.03 0.03   BNP (last 3 results) No results for input(s): PROBNP in the last 8760 hours. CBG:  Recent Labs Lab 12/24/14 1125 12/24/14 1626 12/24/14 2208 12/25/14 0756 12/25/14 1151  GLUCAP 254* 331* 129* 337* 235*    Recent Results (from the past 240 hour(s))  Culture, blood (routine x 2)     Status: None (Preliminary result)   Collection Time: 12/22/14  3:20 AM  Result Value Ref  Range Status   Specimen Description BLOOD RIGHT ANTECUBITAL  Final   Special Requests BOTTLES DRAWN AEROBIC ONLY 4CC  Final   Culture NO GROWTH 3 DAYS  Final   Report Status PENDING  Incomplete  Culture, blood (routine x 2)     Status: None (Preliminary result)   Collection Time: 12/22/14  3:30 AM  Result Value Ref Range Status   Specimen Description BLOOD RIGHT HAND  Final   Special Requests BOTTLES DRAWN AEROBIC ONLY 3CC  Final   Culture NO GROWTH 3 DAYS  Final   Report Status PENDING  Incomplete  Urine culture     Status: None   Collection Time: 12/22/14  6:21 AM  Result Value Ref Range Status   Specimen Description URINE, RANDOM  Final   Special Requests NONE  Final   Culture NO GROWTH 1 DAY  Final   Report Status  12/23/2014 FINAL  Final  MRSA PCR Screening     Status: None   Collection Time: 12/22/14  7:59 AM  Result Value Ref Range Status   MRSA by PCR NEGATIVE NEGATIVE Final    Comment:        The GeneXpert MRSA Assay (FDA approved for NASAL specimens only), is one component of a comprehensive MRSA colonization surveillance program. It is not intended to diagnose MRSA infection nor to guide or monitor treatment for MRSA infections.   Blood culture (routine x 2)     Status: None (Preliminary result)   Collection Time: 12/22/14  9:28 AM  Result Value Ref Range Status   Specimen Description BLOOD RIGHT HAND  Final   Special Requests BOTTLES DRAWN AEROBIC ONLY 3CC  Final   Culture NO GROWTH 3 DAYS  Final   Report Status PENDING  Incomplete  Blood culture (routine x 2)     Status: None (Preliminary result)   Collection Time: 12/22/14  9:46 AM  Result Value Ref Range Status   Specimen Description BLOOD LEFT HAND  Final   Special Requests BOTTLES DRAWN AEROBIC ONLY 1CC  Final   Culture NO GROWTH 3 DAYS  Final   Report Status PENDING  Incomplete           Studies: No results found.      Scheduled Meds: . ARIPiprazole  2 mg Oral Daily  . citalopram  10  mg Oral Daily  . gabapentin  300 mg Oral QHS  . heparin  5,000 Units Subcutaneous 3 times per day  . insulin aspart  0-15 Units Subcutaneous TID WC  . insulin aspart  0-5 Units Subcutaneous QHS  . insulin glargine  15 Units Subcutaneous Daily  . nicotine  21 mg Transdermal Daily  . tiotropium  18 mcg Inhalation Daily   Continuous Infusions:    Active Problems:   DKA (diabetic ketoacidoses)   Acute renal failure syndrome   Diabetic ketoacidosis associated with other specified diabetes mellitus    Time spent: 35 minutes.    Albertine GratesXu,Tirsa Gail, MD, PhD [facility] Pager [phone]  If 7PM-7AM, please contact night-coverage [email or web address] Password [email or web address] 12/25/2014, 1:51 PM    LOS: 3 days

## 2014-12-26 DIAGNOSIS — F1410 Cocaine abuse, uncomplicated: Secondary | ICD-10-CM

## 2014-12-26 DIAGNOSIS — G9341 Metabolic encephalopathy: Secondary | ICD-10-CM

## 2014-12-26 DIAGNOSIS — Z720 Tobacco use: Secondary | ICD-10-CM

## 2014-12-26 LAB — COMPREHENSIVE METABOLIC PANEL
ALT: 35 U/L (ref 14–54)
AST: 35 U/L (ref 15–41)
Albumin: 2.3 g/dL — ABNORMAL LOW (ref 3.5–5.0)
Alkaline Phosphatase: 68 U/L (ref 38–126)
Anion gap: 6 (ref 5–15)
BILIRUBIN TOTAL: 0.5 mg/dL (ref 0.3–1.2)
BUN: 5 mg/dL — ABNORMAL LOW (ref 6–20)
CHLORIDE: 112 mmol/L — AB (ref 101–111)
CO2: 25 mmol/L (ref 22–32)
Calcium: 8.4 mg/dL — ABNORMAL LOW (ref 8.9–10.3)
Creatinine, Ser: 0.49 mg/dL (ref 0.44–1.00)
GFR calc Af Amer: >60 mL/min (ref >60)
GFR calc non Af Amer: >60 mL/min (ref >60)
GLUCOSE: 126 mg/dL — AB (ref 65–99)
POTASSIUM: 3.7 mmol/L (ref 3.5–5.1)
Sodium: 143 mmol/L (ref 135–145)
Total Protein: 4.5 g/dL — ABNORMAL LOW (ref 6.5–8.1)

## 2014-12-26 LAB — GLUCOSE, CAPILLARY
GLUCOSE-CAPILLARY: 231 mg/dL — AB (ref 65–99)
GLUCOSE-CAPILLARY: 381 mg/dL — AB (ref 65–99)

## 2014-12-26 LAB — CBC
HEMATOCRIT: 26.6 % — AB (ref 36.0–46.0)
Hemoglobin: 8.8 g/dL — ABNORMAL LOW (ref 12.0–15.0)
MCH: 27.8 pg (ref 26.0–34.0)
MCHC: 33.1 g/dL (ref 30.0–36.0)
MCV: 84.2 fL (ref 78.0–100.0)
PLATELETS: 137 10*3/uL — AB (ref 150–400)
RBC: 3.16 MIL/uL — ABNORMAL LOW (ref 3.87–5.11)
RDW: 14.2 % (ref 11.5–15.5)
WBC: 4.2 10*3/uL (ref 4.0–10.5)

## 2014-12-26 LAB — TSH: TSH: 0.556 u[IU]/mL (ref 0.350–4.500)

## 2014-12-26 LAB — IRON AND TIBC
IRON: 98 ug/dL (ref 28–170)
Saturation Ratios: 53 % — ABNORMAL HIGH (ref 10.4–31.8)
TIBC: 185 ug/dL — ABNORMAL LOW (ref 250–450)
UIBC: 87 ug/dL

## 2014-12-26 LAB — PHOSPHORUS: Phosphorus: 3.8 mg/dL (ref 2.5–4.6)

## 2014-12-26 LAB — MAGNESIUM: MAGNESIUM: 1.9 mg/dL (ref 1.7–2.4)

## 2014-12-26 LAB — VITAMIN B12: VITAMIN B 12: 481 pg/mL (ref 180–914)

## 2014-12-26 MED ORDER — GUAIFENESIN ER 600 MG PO TB12: 600.0000 mg | ORAL_TABLET | Freq: Two times a day (BID) | ORAL | Status: DC

## 2014-12-26 MED ORDER — INSULIN ASPART 100 UNIT/ML FLEXPEN: PEN_INJECTOR | SUBCUTANEOUS | Status: DC

## 2014-12-26 MED ORDER — FLUTICASONE PROPIONATE 50 MCG/ACT NA SUSP
1.0000 | Freq: Every day | NASAL | Status: DC
Start: 2014-12-26 — End: 2020-09-18

## 2014-12-26 MED ORDER — INSULIN DETEMIR 100 UNIT/ML FLEXPEN: 15.0000 [IU] | PEN_INJECTOR | Freq: Every day | SUBCUTANEOUS | Status: DC

## 2014-12-26 MED ORDER — IPRATROPIUM-ALBUTEROL 0.5-2.5 (3) MG/3ML IN SOLN: 3.0000 mL | Freq: Four times a day (QID) | RESPIRATORY_TRACT | Status: DC | PRN

## 2014-12-26 MED ORDER — GLUCERNA SHAKE PO LIQD: 237.0000 mL | Freq: Three times a day (TID) | ORAL | Status: DC

## 2014-12-26 NOTE — Progress Notes (Signed)
Discharge home. Home discharge instruction given, no question verbalized. 

## 2014-12-26 NOTE — Discharge Summary (Signed)
Discharge Summary  Meagan FairlyLeslie Cummings ZOX:[id number] DOB: 23-Mar-1956  PCP: No primary care provider on file.  Admit date: 12/22/2014 Discharge date: 12/26/2014  Time spent: <7030mins  Recommendations for Outpatient Follow-up:  1. F/u with PMD within a week, repeat cbc/bmp/mag in one week, continue monitor anemia/blood sugar control. 2. F/u with endocrinology within two weeks for diabetes management.  Discharge Diagnoses:  Active Hospital Problems   Diagnosis Date Noted  . DKA (diabetic ketoacidoses) 12/22/2014  . Acute renal failure syndrome   . Diabetic ketoacidosis associated with other specified diabetes mellitus     Resolved Hospital Problems   Diagnosis Date Noted Date Resolved  No resolved problems to display.    Discharge Condition: stable  Diet recommendation: heart healthy/carb modified  Filed Weights   12/22/14 [id number] 12/22/14 0800 12/23/14 0205  Weight: 49.896 kg (110 lb) 42.9 kg (94 lb 9.2 oz) 43.4 kg (95 lb 10.9 oz)    History of present illness:  59 yo female with hx DM presented 7/3 when family found her altered and "breathing funny". In ER found to have glucose >1300, acidotic with pH 6.9, hypothermic and critical care medicine(CCM) called to admit to ICU for severe DKA/metabolic encephalopathy..   After stabilized, CCM [facility]  Hospital Course:  Active Problems:   DKA (diabetic ketoacidoses)   Acute renal failure syndrome   Diabetic ketoacidosis associated with other specified diabetes mellitus  Severe DKA in type I DM  - required ICU admission,Treated with aggressive volume resuscitation and IV insulin per DKA protocol - Resolved and patient was transitioned to subQ insulin - Probably noncompliant. Reported run out of insulin for a week. Request levemir and novolog flex pen at discharge  Poorly controlled type I DM - A1c13.3 - Increase Lantus to 15 units daily and continue NovoLog SSI -  Consulted diabetes coordinator -DC D5 IV fluids  -discharged with levemir flex pen and novolog flex pen.  NAG metabolic acidosis - Anion gap closed to 13 on 7/4. - This was likely secondary to saline hydration and RTA 4 - Treated with bicarbonate drip which is probably contributing to hypernatremia, hyperchloremia and bicarbonate has normalized. - DC bicarbonate drip and encourage free water intake  Hypernatremia and hyperchloremia - Secondary to dehydration, saline and bicarbonate resuscitation - Discontinue bicarbonate drip. Place on half-normal saline hydration. Encouraged free water intake.  -resolved, off lvf, encourage oral intake  Hypomagnesemia and hypophosphatemia - Replace, pmd to repeat labs at follow up  Hypokalemia - Replace and follow  Hypotension/tachycardia/dehydration - Secondary to DKA. Resolved - Troponin 5: Negative  Acute kidney injury - Secondary to dehydration. Resolved  Protein calorie malnutrition - Dietitian consultation, nutrition supplement  anemia -b12/folate/tsh/total bilirubin wnl. Elevated iron saturation.  - pmd to continue monitor and further work up for anemia.  Acute encephalopathy - Secondary to DKA.  -resolved.  Leukocytosis - On admission WBC 19 K. Likely stress response from DKA. - Blood cultures 4 and urine culture 1: Negative to date. - Empirically started antibiotics were discontinued.  Borderline blood pressure: denies dizziness, encourage oral intake.  Copd with intermittent dry cough, mild wheezing on exam: cxr no acute findings, duoneb, mucinex, also reported nasal congestion, flonase. Lung clears at time of discharge.  Substance use: urine tox + cocaine  Smoking cessation: cessation education provided, on nicotine patch in the hospital.    DVT prophylaxis while in the hospital: was on Subcutaneous heparin, changed to lovenox when cr improved on 7/6, encourage  ambulation  Code Status: Full  Family  Communication: None at bedside  Disposition Plan: DC home   Consultants:  CCM-signed off  Procedures:  None  Antibiotics:  Off all antibiotics   Discharge Exam: BP 94/56 mmHg  Pulse 70  Temp(Src) 98.5 F (36.9 C) (Oral)  Resp 17  Ht 5\' 2"  (1.575 m)  Wt 43.4 kg (95 lb 10.9 oz)  BMI 17.50 kg/m2  SpO2 97%  General: NAD Cardiovascular: RRR Respiratory: CTABL  Discharge Instructions You were cared for by a hospitalist during your hospital stay. If you have any questions about your discharge medications or the care you received while you were in the hospital after you are discharged, you can call the unit and asked to speak with the hospitalist on call if the hospitalist that took care of you is not available. Once you are discharged, your primary care physician will handle any further medical issues. Please note that NO REFILLS for any discharge medications will be authorized once you are discharged, as it is imperative that you return to your primary care physician (or establish a relationship with a primary care physician if you do not have one) for your aftercare needs so that they can reassess your need for medications and monitor your lab values.      Discharge Instructions    Diet - low sodium heart healthy    Complete by:  As directed      Increase activity slowly    Complete by:  As directed             Medication List    STOP taking these medications        insulin aspart 100 UNIT/ML injection  Commonly known as:  novoLOG  Replaced by:  insulin aspart 100 UNIT/ML FlexPen      TAKE these medications        ACCU-CHEK AVIVA PLUS test strip  Generic drug:  glucose blood  1 each 4 (four) times daily.     ARIPiprazole 2 MG tablet  Commonly known as:  ABILIFY  Take 2 mg by mouth daily.     citalopram 10 MG tablet  Commonly known as:  CELEXA  Take 10 mg by mouth daily.     feeding supplement (GLUCERNA SHAKE) Liqd  Take 237 mLs by mouth 3 (three)  times daily between meals.     fluticasone 50 MCG/ACT nasal spray  Commonly known as:  FLONASE  Place 1 spray into both nostrils daily.     guaiFENesin 600 MG 12 hr tablet  Commonly known as:  MUCINEX  Take 1 tablet (600 mg total) by mouth 2 (two) times daily.     insulin aspart 100 UNIT/ML FlexPen  Commonly known as:  NOVOLOG FLEXPEN  Before each meal 3 times a day, 140-199 - 2 units, 200-250 - 4 units, 251-299 - 6 units,  300-349 - 8 units,  350 or above 10 units. Insulin PEN if approved, provide syringes and needles if needed.     Insulin Detemir 100 UNIT/ML Pen  Commonly known as:  LEVEMIR  Inject 15 Units into the skin daily at 10 pm.     NEURONTIN 300 MG capsule  Generic drug:  gabapentin  Take 300 mg by mouth at bedtime.     tiotropium 18 MCG inhalation capsule  Commonly known as:  SPIRIVA  Place 18 mcg into inhaler and inhale daily.     triamcinolone cream 0.1 %  Commonly known as:  KENALOG  Apply  1 application topically 2 (two) times daily.       Allergies  Allergen Reactions  . Other Rash    Pt gets rash on face and chest from cat and dog hair   Follow-up Information    Follow up with PHILIP, MATHEWS K, MD In 1 week.   Specialty:  Family Medicine   Why:  hospital follow up for DKA, repeat cbc/bmp/mag in one week.   Contact information:   [address] [place] [address] [phone]       Follow up with PATEL,DHAVAL, MD In 2 weeks.   Specialty:  Endocrinology   Why:  hospital follow up   Contact information:   [address] [place] [address] [phone]        The results of significant diagnostics from this hospitalization (including imaging, microbiology, ancillary and laboratory) are listed below for reference.    Significant Diagnostic Studies: Dg Chest Portable 1 View  12/22/2014   CLINICAL DATA:  Unresponsive.  EXAM: PORTABLE CHEST - 1 VIEW  COMPARISON:  None.  FINDINGS: A single AP portable view of the chest  demonstrates no focal airspace consolidation or alveolar edema. The lungs are grossly clear. There is no large effusion or pneumothorax. Cardiac and mediastinal contours appear unremarkable.  IMPRESSION: No active disease.   Electronically Signed   By: Ellery Plunk M.D.   On: 12/22/2014 03:37    Microbiology: Recent Results (from the past 240 hour(s))  Culture, blood (routine x 2)     Status: None (Preliminary result)   Collection Time: 12/22/14  3:20 AM  Result Value Ref Range Status   Specimen Description BLOOD RIGHT ANTECUBITAL  Final   Special Requests BOTTLES DRAWN AEROBIC ONLY 4CC  Final   Culture NO GROWTH 3 DAYS  Final   Report Status PENDING  Incomplete  Culture, blood (routine x 2)     Status: None (Preliminary result)   Collection Time: 12/22/14  3:30 AM  Result Value Ref Range Status   Specimen Description BLOOD RIGHT HAND  Final   Special Requests BOTTLES DRAWN AEROBIC ONLY 3CC  Final   Culture NO GROWTH 3 DAYS  Final   Report Status PENDING  Incomplete  Urine culture     Status: None   Collection Time: 12/22/14  6:21 AM  Result Value Ref Range Status   Specimen Description URINE, RANDOM  Final   Special Requests NONE  Final   Culture NO GROWTH 1 DAY  Final   Report Status 12/23/2014 FINAL  Final  MRSA PCR Screening     Status: None   Collection Time: 12/22/14  7:59 AM  Result Value Ref Range Status   MRSA by PCR NEGATIVE NEGATIVE Final    Comment:        The GeneXpert MRSA Assay (FDA approved for NASAL specimens only), is one component of a comprehensive MRSA colonization surveillance program. It is not intended to diagnose MRSA infection nor to guide or monitor treatment for MRSA infections.   Blood culture (routine x 2)     Status: None (Preliminary result)   Collection Time: 12/22/14  9:28 AM  Result Value Ref Range Status   Specimen Description BLOOD RIGHT HAND  Final   Special Requests BOTTLES DRAWN AEROBIC ONLY 3CC  Final   Culture NO GROWTH 3  DAYS  Final   Report Status PENDING  Incomplete  Blood culture (routine x 2)     Status: None (Preliminary result)   Collection Time:  12/22/14  9:46 AM  Result Value Ref Range Status   Specimen Description BLOOD LEFT HAND  Final   Special Requests BOTTLES DRAWN AEROBIC ONLY 1CC  Final   Culture NO GROWTH 3 DAYS  Final   Report Status PENDING  Incomplete     Labs: Basic Metabolic Panel:  Recent Labs Lab 12/22/14 0718  12/23/14 0545 12/23/14 1035 12/24/14 0700 12/25/14 0430 12/26/14 0445  NA 148*  < > 151* 148* 147* 142 143  K 5.6*  < > 3.7 3.4* 2.9* 4.2 3.7  CL 118*  < > 124* 126* 120* 115* 112*  CO2 <5*  < > 14* 13* 21* 23 25  GLUCOSE 1114*  < > 285* 307* 235* 300* 126*  BUN 35*  < > 13 11 <5* <5* 5*  CREATININE 2.51*  < > 0.92 0.95 0.60 0.54 0.49  CALCIUM 8.4*  < > 8.5* 8.5* 8.2* 8.1* 8.4*  MG 2.8*  --  1.9  --  1.6* 2.0 1.9  PHOS 8.2*  --  2.0*  --  2.2* 2.8 3.8  < > = values in this interval not displayed. Liver Function Tests:  Recent Labs Lab 12/22/14 0346 12/26/14 0445  AST 22 35  ALT 27 35  ALKPHOS 96 68  BILITOT 1.3* 0.5  PROT 5.6* 4.5*  ALBUMIN 3.2* 2.3*   No results for input(s): LIPASE, AMYLASE in the last 168 hours. No results for input(s): AMMONIA in the last 168 hours. CBC:  Recent Labs Lab 12/22/14 0346 12/25/14 0430 12/26/14 0445  WBC 19.0* 5.1 4.2  NEUTROABS 15.0*  --   --   HGB 10.9* 9.2* 8.8*  HCT 38.4 27.0* 26.6*  MCV 101.6* 83.6 84.2  PLT 274 131* 137*   Cardiac Enzymes:  Recent Labs Lab 12/23/14 1130 12/23/14 2132 12/24/14 0018 12/24/14 0700 12/24/14 1415  TROPONINI <0.03 <0.03 <0.03 <0.03 0.03   BNP: BNP (last 3 results) No results for input(s): BNP in the last 8760 hours.  ProBNP (last 3 results) No results for input(s): PROBNP in the last 8760 hours.  CBG:  Recent Labs Lab 12/25/14 0756 12/25/14 1151 12/25/14 1655 12/25/14 2216 12/26/14 0809  GLUCAP 337* 235* 98 288* 231*       Signed:  Rendy Lazard  MD, PhD  [facility] 12/26/2014, 10:21 AM

## 2014-12-26 NOTE — Progress Notes (Signed)
Inpatient Diabetes Program Recommendations  AACE/ADA: New Consensus Statement on Inpatient Glycemic Control (2013)  Target Ranges:  Prepandial:   less than 140 mg/dL      Peak postprandial:   less than 180 mg/dL (1-2 hours)      Critically ill patients:  140 - 180 mg/dL   Patient for d/c today. Noted novlolog correciton and meal coverage ordered for discharge today-Pt also take levemir at home-8 units bid. Please add the levemir to discharge meds (if not already ordered).  Thank you Lenor CoffinAnn Jazelle Achey, RN, MSN, CDE  Diabetes Inpatient Program Office: [phone] Pager: [phone] 8:00 am to 5:00 pm

## 2014-12-27 LAB — CULTURE, BLOOD (ROUTINE X 2)
CULTURE: NO GROWTH
CULTURE: NO GROWTH
CULTURE: NO GROWTH
Culture: NO GROWTH

## 2014-12-27 LAB — FOLATE RBC
Folate, Hemolysate: 284.6 ng/mL
Folate, RBC: 1054 ng/mL (ref >498)
HEMATOCRIT: 27 % — AB (ref 34.0–46.6)

## 2016-07-17 IMAGING — CR DG CHEST 1V PORT
1 series · 1 of 1 positions shown · non-contrast
Comparison: None.

CLINICAL DATA: Unresponsive.

EXAM:
PORTABLE CHEST - 1 VIEW

[AP]
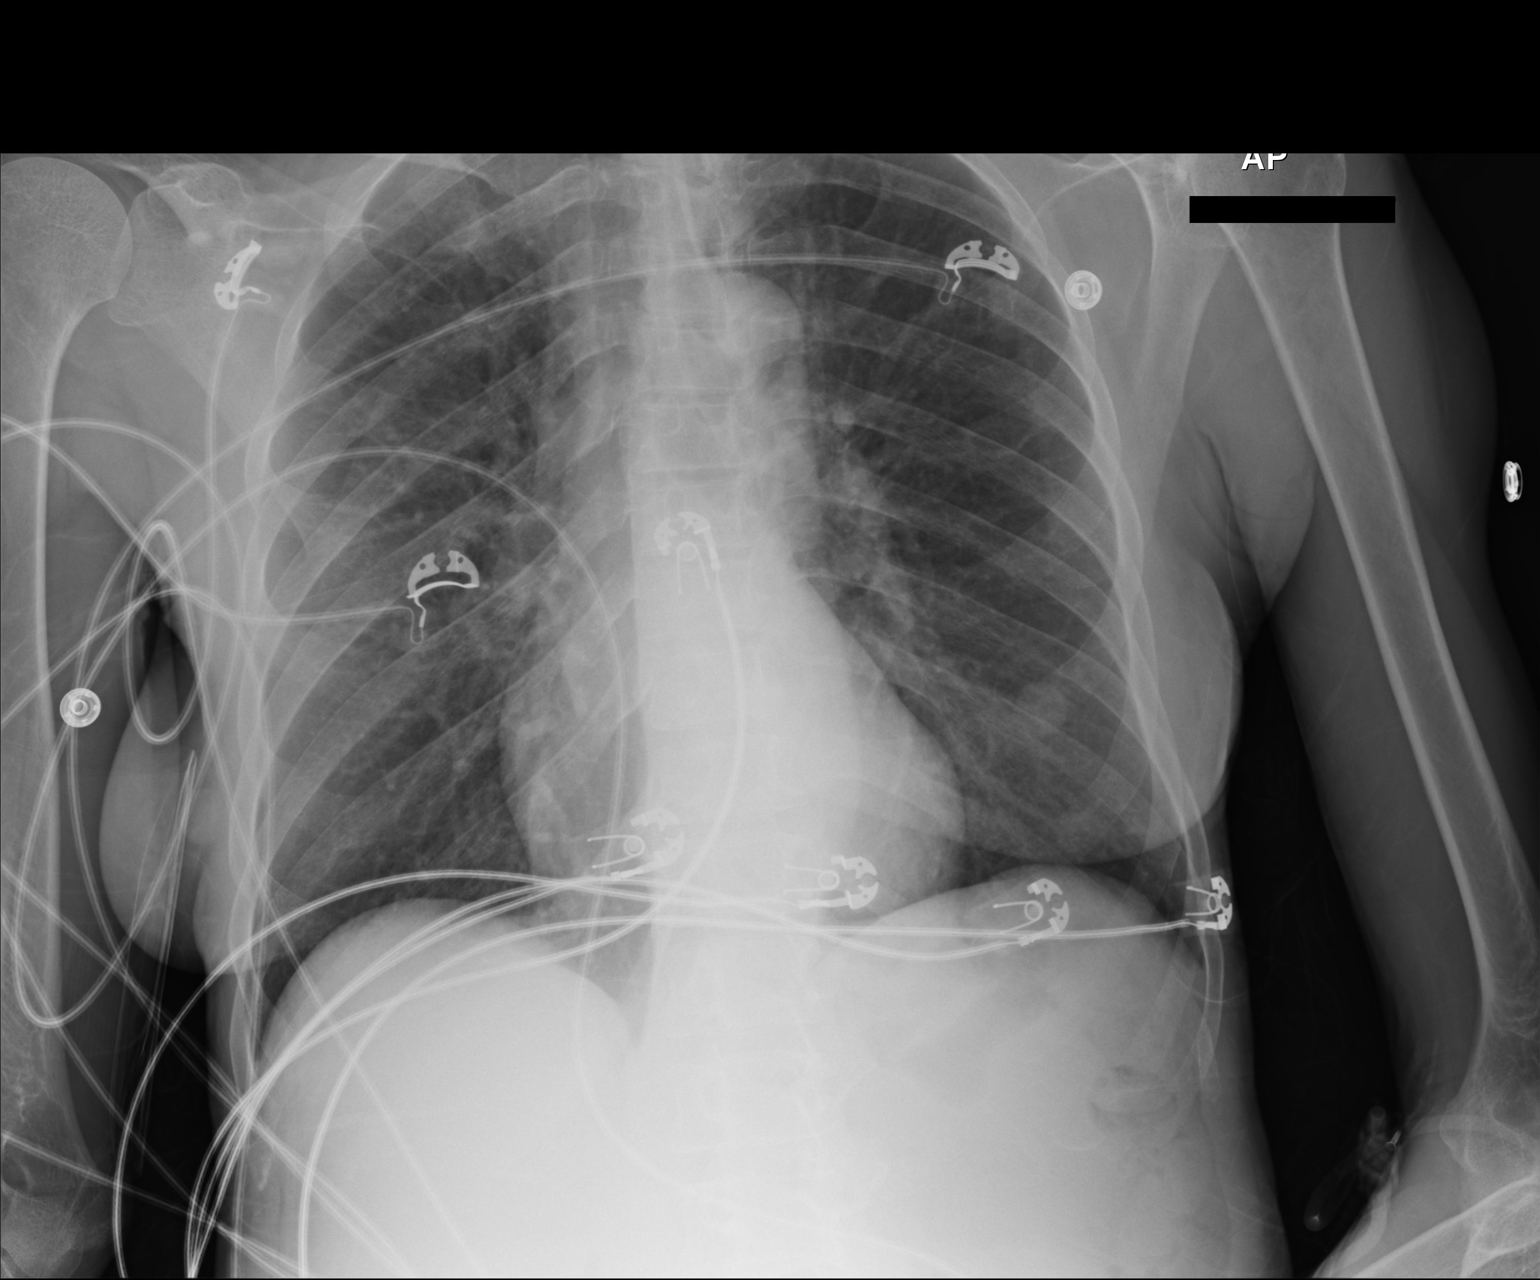

[1 of 1 positions shown; findings below may reference images not displayed]

FINDINGS: A single AP portable view of the chest demonstrates no focal
airspace consolidation or alveolar edema. The lungs are grossly
clear. There is no large effusion or pneumothorax. Cardiac and
mediastinal contours appear unremarkable.
IMPRESSION: No active disease.

## 2019-05-28 ENCOUNTER — Ambulatory Visit (HOSPITAL_COMMUNITY)
Admission: EM | Admit: 2019-05-28 | Discharge: 2019-05-28 | Disposition: A | Discharge status: Other Acute Inpatient Hospital | Payer: Medicare Other

## 2019-05-28 ENCOUNTER — Emergency Department (HOSPITAL_COMMUNITY)
Admission: EM | Admit: 2019-05-28 | Discharge: 2019-05-28 | Discharge status: Absent without leave | Payer: Medicare Other

## 2019-05-28 ENCOUNTER — Other Ambulatory Visit: Payer: Self-pay

## 2019-05-28 NOTE — ED Notes (Signed)
Cant find in lobby- registration states he seen this pt leave lobby.

## 2019-12-24 ENCOUNTER — Inpatient Hospital Stay (HOSPITAL_COMMUNITY)
Admission: EM | Admit: 2019-12-24 | Discharge: 2020-01-01 | DRG: 637 | Disposition: A | Discharge status: Skilled Nursing Facility | Payer: Medicare Other | Attending: Internal Medicine | Admitting: Internal Medicine

## 2019-12-24 ENCOUNTER — Encounter (HOSPITAL_COMMUNITY): Payer: Self-pay | Admitting: Emergency Medicine

## 2019-12-24 ENCOUNTER — Emergency Department (HOSPITAL_COMMUNITY): Payer: Medicare Other

## 2019-12-24 DIAGNOSIS — Z9114 Patient's other noncompliance with medication regimen: Secondary | ICD-10-CM

## 2019-12-24 DIAGNOSIS — F329 Major depressive disorder, single episode, unspecified: Secondary | ICD-10-CM | POA: Diagnosis present

## 2019-12-24 DIAGNOSIS — F17210 Nicotine dependence, cigarettes, uncomplicated: Secondary | ICD-10-CM | POA: Diagnosis present

## 2019-12-24 DIAGNOSIS — G9341 Metabolic encephalopathy: Secondary | ICD-10-CM | POA: Diagnosis present

## 2019-12-24 DIAGNOSIS — Z833 Family history of diabetes mellitus: Secondary | ICD-10-CM

## 2019-12-24 DIAGNOSIS — F1010 Alcohol abuse, uncomplicated: Secondary | ICD-10-CM | POA: Diagnosis present

## 2019-12-24 DIAGNOSIS — E1040 Type 1 diabetes mellitus with diabetic neuropathy, unspecified: Secondary | ICD-10-CM | POA: Diagnosis present

## 2019-12-24 DIAGNOSIS — Z79899 Other long term (current) drug therapy: Secondary | ICD-10-CM

## 2019-12-24 DIAGNOSIS — R748 Abnormal levels of other serum enzymes: Secondary | ICD-10-CM | POA: Diagnosis present

## 2019-12-24 DIAGNOSIS — E1010 Type 1 diabetes mellitus with ketoacidosis without coma: Secondary | ICD-10-CM | POA: Diagnosis present

## 2019-12-24 DIAGNOSIS — R531 Weakness: Secondary | ICD-10-CM | POA: Diagnosis present

## 2019-12-24 DIAGNOSIS — J449 Chronic obstructive pulmonary disease, unspecified: Secondary | ICD-10-CM | POA: Diagnosis present

## 2019-12-24 DIAGNOSIS — Z794 Long term (current) use of insulin: Secondary | ICD-10-CM

## 2019-12-24 DIAGNOSIS — Z20822 Contact with and (suspected) exposure to covid-19: Secondary | ICD-10-CM | POA: Diagnosis present

## 2019-12-24 DIAGNOSIS — IMO0002 Reserved for concepts with insufficient information to code with codable children: Secondary | ICD-10-CM | POA: Diagnosis present

## 2019-12-24 DIAGNOSIS — E10649 Type 1 diabetes mellitus with hypoglycemia without coma: Secondary | ICD-10-CM | POA: Diagnosis not present

## 2019-12-24 DIAGNOSIS — Z90710 Acquired absence of both cervix and uterus: Secondary | ICD-10-CM

## 2019-12-24 DIAGNOSIS — E162 Hypoglycemia, unspecified: Secondary | ICD-10-CM | POA: Diagnosis not present

## 2019-12-24 DIAGNOSIS — F1410 Cocaine abuse, uncomplicated: Secondary | ICD-10-CM | POA: Diagnosis present

## 2019-12-24 DIAGNOSIS — E8889 Other specified metabolic disorders: Secondary | ICD-10-CM | POA: Diagnosis present

## 2019-12-24 DIAGNOSIS — G9340 Encephalopathy, unspecified: Secondary | ICD-10-CM | POA: Diagnosis present

## 2019-12-24 DIAGNOSIS — F1210 Cannabis abuse, uncomplicated: Secondary | ICD-10-CM | POA: Diagnosis present

## 2019-12-24 DIAGNOSIS — Z8249 Family history of ischemic heart disease and other diseases of the circulatory system: Secondary | ICD-10-CM

## 2019-12-24 LAB — COMPREHENSIVE METABOLIC PANEL
ALT: 19 U/L (ref 0–44)
AST: 36 U/L (ref 15–41)
Albumin: 3.7 g/dL (ref 3.5–5.0)
Alkaline Phosphatase: 93 U/L (ref 38–126)
Anion gap: 14 (ref 5–15)
BUN: 13 mg/dL (ref 8–23)
CO2: 22 mmol/L (ref 22–32)
Calcium: 9.2 mg/dL (ref 8.9–10.3)
Chloride: 104 mmol/L (ref 98–111)
Creatinine, Ser: 0.75 mg/dL (ref 0.44–1.00)
GFR calc Af Amer: >60 mL/min (ref >60)
GFR calc non Af Amer: >60 mL/min (ref >60)
Glucose, Bld: 108 mg/dL — ABNORMAL HIGH (ref 70–99)
Potassium: 3.3 mmol/L — ABNORMAL LOW (ref 3.5–5.1)
Sodium: 140 mmol/L (ref 135–145)
Total Bilirubin: 1.1 mg/dL (ref 0.3–1.2)
Total Protein: 7.1 g/dL (ref 6.5–8.1)

## 2019-12-24 LAB — URINALYSIS, ROUTINE W REFLEX MICROSCOPIC
Bilirubin Urine: NEGATIVE
Glucose, UA: NEGATIVE mg/dL
Hgb urine dipstick: NEGATIVE
Ketones, ur: 80 mg/dL — AB
Leukocytes,Ua: NEGATIVE
Nitrite: NEGATIVE
Protein, ur: 100 mg/dL — AB
Specific Gravity, Urine: 1.025 (ref 1.005–1.030)
pH: 5 (ref 5.0–8.0)

## 2019-12-24 LAB — CBC WITH DIFFERENTIAL/PLATELET
Abs Immature Granulocytes: 0.09 10*3/uL — ABNORMAL HIGH (ref 0.00–0.07)
Basophils Absolute: 0 10*3/uL (ref 0.0–0.1)
Basophils Relative: 0 %
Eosinophils Absolute: 0 10*3/uL (ref 0.0–0.5)
Eosinophils Relative: 0 %
HCT: 42.4 % (ref 36.0–46.0)
Hemoglobin: 13.6 g/dL (ref 12.0–15.0)
Immature Granulocytes: 1 %
Lymphocytes Relative: 8 %
Lymphs Abs: 1 10*3/uL (ref 0.7–4.0)
MCH: 28 pg (ref 26.0–34.0)
MCHC: 32.1 g/dL (ref 30.0–36.0)
MCV: 87.4 fL (ref 80.0–100.0)
Monocytes Absolute: 0.7 10*3/uL (ref 0.1–1.0)
Monocytes Relative: 6 %
Neutro Abs: 10.2 10*3/uL — ABNORMAL HIGH (ref 1.7–7.7)
Neutrophils Relative %: 85 %
Platelets: 262 10*3/uL (ref 150–400)
RBC: 4.85 MIL/uL (ref 3.87–5.11)
RDW: 14.5 % (ref 11.5–15.5)
WBC: 12.1 10*3/uL — ABNORMAL HIGH (ref 4.0–10.5)
nRBC: 0 % (ref 0.0–0.2)

## 2019-12-24 LAB — BASIC METABOLIC PANEL
Anion gap: 15 (ref 5–15)
Anion gap: 15 (ref 5–15)
Anion gap: 11 (ref 5–15)
BUN: 13 mg/dL (ref 8–23)
BUN: 13 mg/dL (ref 8–23)
BUN: 11 mg/dL (ref 8–23)
CO2: 21 mmol/L — ABNORMAL LOW (ref 22–32)
CO2: 21 mmol/L — ABNORMAL LOW (ref 22–32)
CO2: 22 mmol/L (ref 22–32)
Calcium: 8.9 mg/dL (ref 8.9–10.3)
Calcium: 8.8 mg/dL — ABNORMAL LOW (ref 8.9–10.3)
Calcium: 8.6 mg/dL — ABNORMAL LOW (ref 8.9–10.3)
Chloride: 105 mmol/L (ref 98–111)
Chloride: 105 mmol/L (ref 98–111)
Chloride: 105 mmol/L (ref 98–111)
Creatinine, Ser: 0.78 mg/dL (ref 0.44–1.00)
Creatinine, Ser: 0.74 mg/dL (ref 0.44–1.00)
Creatinine, Ser: 0.68 mg/dL (ref 0.44–1.00)
GFR calc Af Amer: >60 mL/min (ref >60)
GFR calc Af Amer: >60 mL/min (ref >60)
GFR calc Af Amer: >60 mL/min (ref >60)
GFR calc non Af Amer: >60 mL/min (ref >60)
GFR calc non Af Amer: >60 mL/min (ref >60)
GFR calc non Af Amer: >60 mL/min (ref >60)
Glucose, Bld: 95 mg/dL (ref 70–99)
Glucose, Bld: 148 mg/dL — ABNORMAL HIGH (ref 70–99)
Glucose, Bld: 199 mg/dL — ABNORMAL HIGH (ref 70–99)
Potassium: 3.3 mmol/L — ABNORMAL LOW (ref 3.5–5.1)
Potassium: 4 mmol/L (ref 3.5–5.1)
Potassium: 4 mmol/L (ref 3.5–5.1)
Sodium: 141 mmol/L (ref 135–145)
Sodium: 141 mmol/L (ref 135–145)
Sodium: 138 mmol/L (ref 135–145)

## 2019-12-24 LAB — CK: Total CK: 615 U/L — ABNORMAL HIGH (ref 38–234)

## 2019-12-24 LAB — SARS CORONAVIRUS 2 BY RT PCR (HOSPITAL ORDER, PERFORMED IN ~~LOC~~ HOSPITAL LAB): SARS Coronavirus 2: NEGATIVE

## 2019-12-24 LAB — I-STAT VENOUS BLOOD GAS, ED
Acid-Base Excess: 5 mmol/L — ABNORMAL HIGH (ref 0.0–2.0)
Bicarbonate: 27.7 mmol/L (ref 20.0–28.0)
Calcium, Ion: 1.06 mmol/L — ABNORMAL LOW (ref 1.15–1.40)
HCT: 43 % (ref 36.0–46.0)
Hemoglobin: 14.6 g/dL (ref 12.0–15.0)
O2 Saturation: 97 %
Potassium: 4.1 mmol/L (ref 3.5–5.1)
Sodium: 145 mmol/L (ref 135–145)
TCO2: 29 mmol/L (ref 22–32)
pCO2, Ven: 34.1 mmHg — ABNORMAL LOW (ref 44.0–60.0)
pH, Ven: 7.518 — ABNORMAL HIGH (ref 7.250–7.430)
pO2, Ven: 80 mmHg — ABNORMAL HIGH (ref 32.0–45.0)

## 2019-12-24 LAB — RAPID URINE DRUG SCREEN, HOSP PERFORMED
Amphetamines: NOT DETECTED
Barbiturates: NOT DETECTED
Benzodiazepines: NOT DETECTED
Cocaine: POSITIVE — AB
Opiates: NOT DETECTED
Tetrahydrocannabinol: POSITIVE — AB

## 2019-12-24 LAB — CBG MONITORING, ED
Glucose-Capillary: 173 mg/dL — ABNORMAL HIGH (ref 70–99)
Glucose-Capillary: 89 mg/dL (ref 70–99)
Glucose-Capillary: 54 mg/dL — ABNORMAL LOW (ref 70–99)

## 2019-12-24 LAB — TROPONIN I (HIGH SENSITIVITY)
Troponin I (High Sensitivity): 10 ng/L (ref <18)
Troponin I (High Sensitivity): 6 ng/L (ref <18)

## 2019-12-24 LAB — HEMOGLOBIN A1C
Hgb A1c MFr Bld: 10.9 % — ABNORMAL HIGH (ref 4.8–5.6)
Mean Plasma Glucose: 266.13 mg/dL

## 2019-12-24 LAB — GLUCOSE, CAPILLARY: Glucose-Capillary: 170 mg/dL — ABNORMAL HIGH (ref 70–99)

## 2019-12-24 LAB — BETA-HYDROXYBUTYRIC ACID
Beta-Hydroxybutyric Acid: 3.27 mmol/L — ABNORMAL HIGH (ref 0.05–0.27)
Beta-Hydroxybutyric Acid: 1.2 mmol/L — ABNORMAL HIGH (ref 0.05–0.27)

## 2019-12-24 LAB — HIV ANTIBODY (ROUTINE TESTING W REFLEX): HIV Screen 4th Generation wRfx: NONREACTIVE

## 2019-12-24 LAB — LACTIC ACID, PLASMA: Lactic Acid, Venous: 0.9 mmol/L (ref 0.5–1.9)

## 2019-12-24 MED ORDER — THIAMINE HCL 100 MG PO TABS
100.0000 mg | ORAL_TABLET | Freq: Every day | ORAL | Status: DC
  Administered 2019-12-25 – 2020-01-01 (×8): 100 mg via ORAL
  Filled 2019-12-24 (×8): qty 1

## 2019-12-24 MED ORDER — SODIUM CHLORIDE 0.9 % IV SOLN
INTRAVENOUS | Status: DC
Start: 2019-12-24 — End: 2019-12-24

## 2019-12-24 MED ORDER — IOHEXOL 300 MG/ML  SOLN
100.0000 mL | Freq: Once | INTRAMUSCULAR | Status: AC | PRN
  Administered 2019-12-24: 100 mL via INTRAVENOUS

## 2019-12-24 MED ORDER — POTASSIUM CHLORIDE 10 MEQ/100ML IV SOLN
10.0000 meq | INTRAVENOUS | Status: DC
  Administered 2019-12-24: 10 meq via INTRAVENOUS
  Filled 2019-12-24 (×2): qty 100

## 2019-12-24 MED ORDER — LACTATED RINGERS IV BOLUS
1000.0000 mL | Freq: Once | INTRAVENOUS | Status: AC
  Administered 2019-12-24: 1000 mL via INTRAVENOUS

## 2019-12-24 MED ORDER — ENOXAPARIN SODIUM 40 MG/0.4ML ~~LOC~~ SOLN
40.0000 mg | SUBCUTANEOUS | Status: DC
  Administered 2019-12-25 – 2020-01-01 (×7): 40 mg via SUBCUTANEOUS
  Filled 2019-12-24 (×7): qty 0.4

## 2019-12-24 MED ORDER — KETOROLAC TROMETHAMINE 15 MG/ML IJ SOLN
15.0000 mg | Freq: Once | INTRAMUSCULAR | Status: AC
  Administered 2019-12-24: 15 mg via INTRAVENOUS
  Filled 2019-12-24: qty 1

## 2019-12-24 MED ORDER — DEXTROSE-NACL 5-0.45 % IV SOLN: INTRAVENOUS | Status: DC

## 2019-12-24 MED ORDER — LACTATED RINGERS IV SOLN: INTRAVENOUS | Status: DC

## 2019-12-24 MED ORDER — DEXTROSE 50 % IV SOLN
0.0000 mL | INTRAVENOUS | Status: DC | PRN
  Administered 2019-12-24: 50 mL via INTRAVENOUS
  Filled 2019-12-24: qty 50

## 2019-12-24 MED ORDER — FOLIC ACID 1 MG PO TABS
1.0000 mg | ORAL_TABLET | Freq: Every day | ORAL | Status: DC
  Administered 2019-12-25 – 2020-01-01 (×8): 1 mg via ORAL
  Filled 2019-12-24 (×8): qty 1

## 2019-12-24 NOTE — H&P (Signed)
Date: 12/24/2019               Patient Name:  Zeda Gangwer MRN: [id number]  DOB: 1955-07-22 Age / Sex: 64 y.o., female   PCP: Patient, No Pcp Per         Medical Service: Internal Medicine Teaching Service         Attending Physician: Dr. Sandre Kitty, Elwin Mocha, MD    First Contact: Dr. Cyndie Chime Pager: [phone]  Second Contact: Dr. Dortha Schwalbe Pager: [phone]       After Hours (After 5p/  First Contact Pager: [phone]  weekends / holidays): Second Contact Pager: [phone]   Chief Complaint: Hypoglycemia, AMS  History of Present Illness:  Patient is a 64 yo AAF with PMH of Insulin dependent DM, COPD, hx of DKA, polysubstance use including alcohol and marijuana use, who was brought to the hospital via EMS for hypoglycemia and altered mental status. She was found on the floor by her son. Blood glucose was 31 when EMS arrived and responded well to D10 and improved to 171.   Patient was somnolent but respond to verbal stimulation during examination. She is oriented x 3. She denies pain. Patient's son was in the room to help answer the questions. Patient is a poor historian and does not remember the event. She reports giving herself insulin on Friday but does not remember how much. Per patient's son, she is poor compliant on her diabetic medications and does not check her blood glucose as instructed. Patient's son usually checks on her everyday and the last time he heard from her was last Friday. He states that she has had many similar incidents in the past. The last time she sees a PCP was a year ago. Both patient and her son are unsure of her insulin dosage.   CT head, cervical spine and abd/pelvic in the ED are negative for acute injury.   Per chart review, she was seen in the ED 05/2019 for the same reason but left the hospital. She was also in the ED in 11/2018 at [facility] for hypoglycemia.  Meds:  No outpatient medications have been marked as taking for the 12/24/19 encounter  [facility] Encounter).     Allergies: Allergies as of 12/24/2019 - Review Complete 12/24/2019  Allergen Reaction Noted  . Other Rash 12/23/2014   Past Medical History:  Diagnosis Date  . Depression   . Diabetes mellitus without complication (HCC)     Family History:  Mother: DM, HTN  Social History:  Alcohol - 1 bottle of wine or beer daily Marijuana use    Review of Systems: Review of Systems  Reason unable to perform ROS: altered mental status.   Physical Exam: Blood pressure (!) 156/86, pulse 96, temperature 99.9 F (37.7 C), temperature source Rectal, resp. rate 14, height 5\' 2"  (1.575 m), weight 43.5 kg, SpO2 97 %. Physical Exam Constitutional:      Comments: Somnolent but respond to verbal stimulation  Eyes:     General: No scleral icterus. Cardiovascular:     Rate and Rhythm: Normal rate and regular rhythm.     Heart sounds: Normal heart sounds.  Pulmonary:     Breath sounds: Normal breath sounds.  Abdominal:     General: Abdomen is flat. Bowel sounds are normal. There is no distension.     Palpations: Abdomen is soft.     Tenderness: There is abdominal tenderness.     Comments: Tenderness to LLQ to palpation  Musculoskeletal:  Right lower leg: No edema.     Left lower leg: No edema.     Comments: Bilateral feet are cold to touch  Skin:    General: Skin is warm.  Neurological:     Mental Status: She is oriented to person, place, and time.    Assessment & Plan by Problem:  Patient is a 64 yo AAF with PMH of Insulin dependent DM, COPD, hx of DKA, polysubstance use including alcohol and marijuana use, who was brought to the hospital via EMS for hypoglycemia and altered mental status.  Principal Problem:   Diabetic ketoacidosis (HCC) Active Problems:   Hypoglycemia   Acute metabolic encephalopathy   Uncontrolled diabetes mellitus (HCC)  Acute Metabolic encephalopathy - Likely a combination of mild DKA and hypoglycemia secondary to patient's poor  compliance to insulin administration. Even though anion gap is normal at 15, B-hydroxybutyric acid elevated, low bicarb level and ketone in her urine fit the DKA scenario. - Blood glucose improves to 95. Continue D5 -  0.45% NS 75 cc/h and LR 100 cc/h  - Check CBG Q1h. If CBG > 180, will start q4h SSI and start feeding her - Continue checking B-hydroxybutyric acid Q8h and BMP q4h - Check VBG for acidosis  - Make patient NPO  - Order PT eval for functional status   Insulin dependent DM - Per chart review, patient last saw a PCP in 12/2018 and was on Metformin 500 mg, insulin Aspart 7 units BID and Glargine 38 units daily.  - Last A1C in 2016 was 13.3. New A1C pending - Patient is poor complaint with insulin administration - Holding insulin given CBG of 95. Will start SSI when CBG > 180. - Patient will need diabetic education prior discharge and follow up with PCP for closer management of diabetes.   Elevated CK - Continue IVF - Check BNP q4h  - Check CK tomorrow morning  Alcohol abuse - Unknown patient's last drink - CIWA without Ativan in place  - Add Thiamine and Folic acid - Will talk to patient about alcohol cessation when she is back to baseline   Poly substance abuse  - UDS shows positive Cocaine and Marijuana - Will discuss with patient about substance abuse once she is back to baseline.   Diet: NPO IVF: D5 -  0.45% NS 75 cc/h and LR CODE: full DVT : lovenox 40 mg subq  Dispo: Admit patient to Progressive level of care  Signed: Doran Stabler, DO 12/24/2019, 5:02 PM  Pager: [phone] After 5pm on weekdays and 1pm on weekends: On Call pager: [phone]

## 2019-12-24 NOTE — ED Provider Notes (Signed)
Medical screening examination/treatment/procedure(s) were conducted as a shared visit with non-physician practitioner(s) and myself.  I personally evaluated the patient during the encounter.     64 year old female here after some found on the floor.  Last seen several days ago.  Blood sugar 31 and given D10 with good improvement.  Patient now is sleepy but arousable.  Answers questions appropriately.  Blood sugar on arrival here was 170. work-up is pending at this time   Lorre Nick, MD 12/24/19 1358

## 2019-12-24 NOTE — ED Triage Notes (Signed)
PT presents from home by [facility] -PT lives alone -son found her on the floor; last seen on Friday -Per EMS, blood sugar was 31; she received 25g D10  CBG on arrival to ED is 173

## 2019-12-24 NOTE — ED Notes (Signed)
Called CT to see when Pt would go for a scan. They are waiting on lab results. Called main lab to check on results. They said they will check to see.

## 2019-12-24 NOTE — ED Provider Notes (Signed)
[facility] EMERGENCY DEPARTMENT Provider Note   CSN: [id number] Arrival date & time: 12/24/19  1027     History Chief Complaint  Patient presents with  . Hypoglycemia    [facility] is a 64 y.o. female with history of insulin dependent DM and polysubstance abuse who presents with hypoglycemia, weakness, and altered mental status. Pt is a poor historian and cannot give an adequate history at this time. She states that she is having pain in her upper abdomen. She cannot tell me how long this has been going on. Apparently she was found by her son on the floor this morning when he checked on her. He told EMS that she has difficulty caring for herself. EMS was called and she was found to be hypoglycemic in the 30s. She was given D10 and glucose has improved to 170s here but she continues to be weak and confused. She states she has been eating and drinking and taking her meds as prescribed. I discussed with her son [name] who states that she becomes hypoglycemia very frequently and EMS has to come out all the time. Per chart review she's been to the hospital multiples times for DKA and hypoglycemic episodes. He doesn't think she's taking her insulin correctly. He states that usually she will go back to normal with her blood sugar is corrected but thinks that if she was like this all weekend it's probably why she's not going back to normal completely.  Son [name]: [phone]   LEVEL 5 caveat due to lethargy and confusion  HPI     Past Medical History:  Diagnosis Date  . Depression   . Diabetes mellitus without complication [facility])     Patient Active Problem List   Diagnosis Date Noted  . DKA (diabetic ketoacidoses) (HCC) 12/22/2014  . Acute renal failure syndrome (HCC)   . Diabetic ketoacidosis associated with other specified diabetes mellitus     Past Surgical History:  Procedure Laterality Date  . ABDOMINAL HYSTERECTOMY       OB History   No obstetric  history on file.     No family history on file.  Social History   Tobacco Use  . Smoking status: Current Every Day Smoker    Packs/day: 0.50    Types: Cigarettes  Substance Use Topics  . Alcohol use: Yes    Alcohol/week: 1.0 standard drink    Types: 1 Cans of beer per week  . Drug use: No    Home Medications Prior to Admission medications   Medication Sig Start Date End Date Taking? Authorizing Provider  ARIPiprazole (ABILIFY) 2 MG tablet Take 2 mg by mouth daily. 11/26/14 12/26/14  [provider]  citalopram (CELEXA) 10 MG tablet Take 10 mg by mouth daily.    [provider]  feeding supplement, GLUCERNA SHAKE, (GLUCERNA SHAKE) LIQD Take 237 mLs by mouth 3 (three) times daily between meals. 12/26/14   Albertine Grates, MD  fluticasone (FLONASE) 50 MCG/ACT nasal spray Place 1 spray into both nostrils daily. 12/26/14   Albertine Grates, MD  gabapentin (NEURONTIN) 300 MG capsule Take 300 mg by mouth at bedtime. 12/12/14 12/12/15  [provider]  glucose blood (ACCU-CHEK AVIVA PLUS) test strip 1 each 4 (four) times daily. 12/12/14   [provider]  guaiFENesin (MUCINEX) 600 MG 12 hr tablet Take 1 tablet (600 mg total) by mouth 2 (two) times daily. 12/26/14   Albertine Grates, MD  insulin aspart (NOVOLOG FLEXPEN) 100 UNIT/ML FlexPen Before each meal  3 times a day, 140-199 - 2 units, 200-250 - 4 units, 251-299 - 6 units,  300-349 - 8 units,  350 or above 10 units. Insulin PEN if approved, provide syringes and needles if needed. 12/26/14   Albertine Grates, MD  Insulin Detemir (LEVEMIR) 100 UNIT/ML Pen Inject 15 Units into the skin daily at 10 pm. 12/26/14   Albertine Grates, MD  tiotropium (SPIRIVA) 18 MCG inhalation capsule Place 18 mcg into inhaler and inhale daily. 01/05/14 01/05/15  [provider]    Allergies    Other  Review of Systems   Review of Systems  Unable to perform ROS: Mental status change    Physical Exam Updated Vital Signs BP (!) 155/92 (BP Location: Right Arm)    Pulse 96   Temp 98.4 F (36.9 C) (Oral)   Resp 16   Ht 5\' 2"  (1.575 m)   Wt 43.5 kg   SpO2 99%   BMI 17.54 kg/m   Physical Exam Vitals and nursing note reviewed.  Constitutional:      General: She is not in acute distress.    Appearance: Normal appearance. She is well-developed. She is not ill-appearing.     Comments: Lethargic appearing female in NAD. Pt has difficulty answering questions and she has urinated on herself  HENT:     Head: Normocephalic and atraumatic.  Eyes:     General: No scleral icterus.       Right eye: No discharge.        Left eye: No discharge.     Conjunctiva/sclera: Conjunctivae normal.     Pupils: Pupils are equal, round, and reactive to light.  Cardiovascular:     Rate and Rhythm: Tachycardia present.  Pulmonary:     Effort: Pulmonary effort is normal. No respiratory distress.     Breath sounds: Normal breath sounds.  Abdominal:     General: There is no distension.     Palpations: Abdomen is soft.     Tenderness: There is abdominal tenderness (epigastric).  Musculoskeletal:     Cervical back: Normal range of motion.  Skin:    General: Skin is warm and dry.  Neurological:     Mental Status: She is alert and oriented to person, place, and time.  Psychiatric:        Behavior: Behavior normal.     ED Results / Procedures / Treatments   Labs (all labs ordered are listed, but only abnormal results are displayed) Labs Reviewed  BETA-HYDROXYBUTYRIC ACID - Abnormal; Notable for the following components:      Result Value   Beta-Hydroxybutyric Acid 3.27 (*)    All other components within normal limits  CBC WITH DIFFERENTIAL/PLATELET - Abnormal; Notable for the following components:   WBC 12.1 (*)    Neutro Abs 10.2 (*)    Abs Immature Granulocytes 0.09 (*)    All other components within normal limits  URINALYSIS, ROUTINE W REFLEX MICROSCOPIC - Abnormal; Notable for the following components:   Ketones, ur 80 (*)    Protein, ur 100 (*)     Bacteria, UA FEW (*)    All other components within normal limits  COMPREHENSIVE METABOLIC PANEL - Abnormal; Notable for the following components:   Potassium 3.3 (*)    Glucose, Bld 108 (*)    All other components within normal limits  CK - Abnormal; Notable for the following components:   Total CK 615 (*)    All other components within normal limits  CBG MONITORING, ED -  Abnormal; Notable for the following components:   Glucose-Capillary 173 (*)    All other components within normal limits  CULTURE, BLOOD (ROUTINE X 2)  CULTURE, BLOOD (ROUTINE X 2)  SARS CORONAVIRUS 2 BY RT PCR (HOSPITAL ORDER, PERFORMED IN [facility])  BETA-HYDROXYBUTYRIC ACID  LACTIC ACID, PLASMA  LACTIC ACID, PLASMA  BETA-HYDROXYBUTYRIC ACID  RAPID URINE DRUG SCREEN, HOSP PERFORMED  TROPONIN I (HIGH SENSITIVITY)  TROPONIN I (HIGH SENSITIVITY)    EKG None  Radiology CT Head Wo Contrast  Result Date: 12/24/2019 CLINICAL DATA:  64 year old female with head trauma. EXAM: CT HEAD WITHOUT CONTRAST CT CERVICAL SPINE WITHOUT CONTRAST TECHNIQUE: Multidetector CT imaging of the head and cervical spine was performed following the standard protocol without intravenous contrast. Multiplanar CT image reconstructions of the cervical spine were also generated. COMPARISON:  None. FINDINGS: CT HEAD FINDINGS Brain: The ventricles and sulci appropriate size for patient's age. The gray-white matter discrimination is preserved. There is no acute intracranial hemorrhage. No mass effect or midline shift. No extra-axial fluid collection. Vascular: No hyperdense vessel or unexpected calcification. Skull: Normal. Negative for fracture or focal lesion. Sinuses/Orbits: There is opacification of several right mastoid air cells. The visualized paranasal sinuses and the left mastoid air cells are otherwise clear. Other: None CT CERVICAL SPINE FINDINGS Alignment: No acute subluxation. There is straightening of normal cervical lordosis  which may be positional or due to muscle spasm. Skull base and vertebrae: No acute fracture. Soft tissues and spinal canal: No prevertebral fluid or swelling. No visible canal hematoma. Disc levels: Multilevel degenerative changes most prominent at C6-C7 with endplate irregularity and disc space narrowing. Upper chest: Mildly enlarged thyroid gland may represent goiters changes. Clinical correlation and follow-up with ultrasound on a nonemergent/outpatient basis recommended. Other: None IMPRESSION: 1. No acute intracranial pathology. 2. No acute/traumatic cervical spine pathology. Electronically Signed   By: Elgie Collard M.D.   On: 12/24/2019 15:24   CT Cervical Spine Wo Contrast  Result Date: 12/24/2019 CLINICAL DATA:  64 year old female with head trauma. EXAM: CT HEAD WITHOUT CONTRAST CT CERVICAL SPINE WITHOUT CONTRAST TECHNIQUE: Multidetector CT imaging of the head and cervical spine was performed following the standard protocol without intravenous contrast. Multiplanar CT image reconstructions of the cervical spine were also generated. COMPARISON:  None. FINDINGS: CT HEAD FINDINGS Brain: The ventricles and sulci appropriate size for patient's age. The gray-white matter discrimination is preserved. There is no acute intracranial hemorrhage. No mass effect or midline shift. No extra-axial fluid collection. Vascular: No hyperdense vessel or unexpected calcification. Skull: Normal. Negative for fracture or focal lesion. Sinuses/Orbits: There is opacification of several right mastoid air cells. The visualized paranasal sinuses and the left mastoid air cells are otherwise clear. Other: None CT CERVICAL SPINE FINDINGS Alignment: No acute subluxation. There is straightening of normal cervical lordosis which may be positional or due to muscle spasm. Skull base and vertebrae: No acute fracture. Soft tissues and spinal canal: No prevertebral fluid or swelling. No visible canal hematoma. Disc levels: Multilevel  degenerative changes most prominent at C6-C7 with endplate irregularity and disc space narrowing. Upper chest: Mildly enlarged thyroid gland may represent goiters changes. Clinical correlation and follow-up with ultrasound on a nonemergent/outpatient basis recommended. Other: None IMPRESSION: 1. No acute intracranial pathology. 2. No acute/traumatic cervical spine pathology. Electronically Signed   By: Elgie Collard M.D.   On: 12/24/2019 15:24   CT Abdomen Pelvis W Contrast  Result Date: 12/24/2019 CLINICAL DATA:  Epigastric pain. Patient was found on  the floor by her son. Blood sugar was 31. EXAM: CT ABDOMEN AND PELVIS WITH CONTRAST TECHNIQUE: Multidetector CT imaging of the abdomen and pelvis was performed using the standard protocol following bolus administration of intravenous contrast. CONTRAST:  OMNIPAQUE IOHEXOL 300 MG/ML  SOLN COMPARISON:  None. FINDINGS: Lower chest: There is bibasilar atelectasis.  Heart size is normal. Hepatobiliary: Focal fatty infiltration adjacent to the falciform ligament. Gallbladder is unremarkable. Pancreas: Unremarkable. No pancreatic ductal dilatation or surrounding inflammatory changes. Spleen: Normal in size without focal abnormality. Adrenals/Urinary Tract: Normal adrenal glands. Subcentimeter small renal lesions bilaterally likely represent cysts but are too small fully characterize. No hydronephrosis. Ureters are unremarkable. Urinary bladder is distended and otherwise normal in appearance. Stomach/Bowel: The stomach and small bowel loops are normal in appearance. The appendix is not seen and may be surgically absent. Loops of colon are unremarkable. Vascular/Lymphatic: There is atherosclerotic calcification of the abdominal aorta. No associated aneurysm. There is normal vascular opacification of the celiac axis, superior mesenteric artery, and inferior mesenteric artery. Normal appearance of the portal venous system and inferior vena cava. No retroperitoneal or  mesenteric adenopathy. Reproductive: Prior hysterectomy.  No adnexal mass. Other: No free pelvic fluid. Small fat containing paraumbilical hernia. Musculoskeletal: No fracture is seen. IMPRESSION: 1. No evidence for acute abnormality of the abdomen or pelvis. 2. Small fat containing paraumbilical hernia. 3. Prior hysterectomy. 4. Aortic Atherosclerosis (ICD10-I70.0). Electronically Signed   By: Norva Pavlov M.D.   On: 12/24/2019 15:25   DG Chest Port 1 View  Result Date: 12/24/2019 CLINICAL DATA:  Altered mental status EXAM: PORTABLE CHEST 1 VIEW COMPARISON:  Chest radiograph dated 12/22/2014 FINDINGS: The heart size and mediastinal contours are within normal limits. Both lungs are clear. The visualized skeletal structures are unremarkable. IMPRESSION: No active disease. Electronically Signed   By: Romona Curls M.D.   On: 12/24/2019 14:47    Procedures Procedures (including critical care time)  Medications Ordered in ED Medications  lactated ringers bolus 1,000 mL (has no administration in time range)  ketorolac (TORADOL) 15 MG/ML injection 15 mg (15 mg Intravenous Given 12/24/19 1215)  iohexol (OMNIPAQUE) 300 MG/ML solution 100 mL (100 mLs Intravenous Contrast Given 12/24/19 1454)    ED Course  I have reviewed the triage vital signs and the nursing notes.  Pertinent labs & imaging results that were available during my care of the patient were reviewed by me and considered in my medical decision making (see chart for details).  64 year old female presents with AMS and hypoglycemia. BP is elevated but otherwise vitals are normal. Rectal temp is elevated without true fever. Pt is lethargic but answers questions although is very difficult to get a history from. Her CBG is 170s here and mental status has not improved. She is mildly tachycardic and it's unclear how long she was on the ground for. Will obtain CT head, C-spine, CXR, CT abdomen/pelvis, labs, UA.   CBC shows mild leukocytosis (12).  CMP shows mild hypokalemia (3.3). She is not in DKA. CK is mildly elevated to 615. Trop is 10. CT head, C-spine, CT A/P is negative. UA has 80 ketones. Will add 1L of fluid.   Shared visit with Dr. Freida Busman. Pt remains encephalopathic after correction of blood sugar. Will admit for further management. Discussed with IM team who will admit.   MDM Rules/Calculators/A&P  Final Clinical Impression(s) / ED Diagnoses Final diagnoses:  Acute metabolic encephalopathy due to hypoglycemia    Rx / DC Orders ED Discharge Orders    None       Bethel BornGekas, Averianna Brugger Marie, PA-C 12/24/19 1601    Lorre NickAllen, Anthony, MD 12/27/19 1719

## 2019-12-25 DIAGNOSIS — Z794 Long term (current) use of insulin: Secondary | ICD-10-CM | POA: Diagnosis not present

## 2019-12-25 DIAGNOSIS — F1210 Cannabis abuse, uncomplicated: Secondary | ICD-10-CM | POA: Diagnosis present

## 2019-12-25 DIAGNOSIS — R748 Abnormal levels of other serum enzymes: Secondary | ICD-10-CM | POA: Diagnosis present

## 2019-12-25 DIAGNOSIS — E8889 Other specified metabolic disorders: Secondary | ICD-10-CM | POA: Diagnosis present

## 2019-12-25 DIAGNOSIS — F1010 Alcohol abuse, uncomplicated: Secondary | ICD-10-CM | POA: Diagnosis present

## 2019-12-25 DIAGNOSIS — Z20822 Contact with and (suspected) exposure to covid-19: Secondary | ICD-10-CM | POA: Diagnosis present

## 2019-12-25 DIAGNOSIS — E1040 Type 1 diabetes mellitus with diabetic neuropathy, unspecified: Secondary | ICD-10-CM | POA: Diagnosis present

## 2019-12-25 DIAGNOSIS — J449 Chronic obstructive pulmonary disease, unspecified: Secondary | ICD-10-CM | POA: Diagnosis present

## 2019-12-25 DIAGNOSIS — Z9114 Patient's other noncompliance with medication regimen: Secondary | ICD-10-CM | POA: Diagnosis not present

## 2019-12-25 DIAGNOSIS — F17210 Nicotine dependence, cigarettes, uncomplicated: Secondary | ICD-10-CM | POA: Diagnosis present

## 2019-12-25 DIAGNOSIS — G9341 Metabolic encephalopathy: Secondary | ICD-10-CM | POA: Diagnosis present

## 2019-12-25 DIAGNOSIS — F329 Major depressive disorder, single episode, unspecified: Secondary | ICD-10-CM | POA: Diagnosis present

## 2019-12-25 DIAGNOSIS — Z90710 Acquired absence of both cervix and uterus: Secondary | ICD-10-CM | POA: Diagnosis not present

## 2019-12-25 DIAGNOSIS — R531 Weakness: Secondary | ICD-10-CM | POA: Diagnosis present

## 2019-12-25 DIAGNOSIS — E162 Hypoglycemia, unspecified: Secondary | ICD-10-CM | POA: Diagnosis present

## 2019-12-25 DIAGNOSIS — E1010 Type 1 diabetes mellitus with ketoacidosis without coma: Secondary | ICD-10-CM | POA: Diagnosis present

## 2019-12-25 DIAGNOSIS — Z79899 Other long term (current) drug therapy: Secondary | ICD-10-CM | POA: Diagnosis not present

## 2019-12-25 DIAGNOSIS — Z8249 Family history of ischemic heart disease and other diseases of the circulatory system: Secondary | ICD-10-CM | POA: Diagnosis not present

## 2019-12-25 DIAGNOSIS — F1410 Cocaine abuse, uncomplicated: Secondary | ICD-10-CM | POA: Diagnosis present

## 2019-12-25 DIAGNOSIS — Z833 Family history of diabetes mellitus: Secondary | ICD-10-CM | POA: Diagnosis not present

## 2019-12-25 DIAGNOSIS — E10649 Type 1 diabetes mellitus with hypoglycemia without coma: Secondary | ICD-10-CM | POA: Diagnosis present

## 2019-12-25 LAB — CBC
HCT: 36.3 % (ref 36.0–46.0)
Hemoglobin: 11.8 g/dL — ABNORMAL LOW (ref 12.0–15.0)
MCH: 27.8 pg (ref 26.0–34.0)
MCHC: 32.5 g/dL (ref 30.0–36.0)
MCV: 85.4 fL (ref 80.0–100.0)
Platelets: 231 10*3/uL (ref 150–400)
RBC: 4.25 MIL/uL (ref 3.87–5.11)
RDW: 14.5 % (ref 11.5–15.5)
WBC: 7.9 10*3/uL (ref 4.0–10.5)
nRBC: 0 % (ref 0.0–0.2)

## 2019-12-25 LAB — BASIC METABOLIC PANEL
Anion gap: 9 (ref 5–15)
Anion gap: 8 (ref 5–15)
Anion gap: 10 (ref 5–15)
BUN: 10 mg/dL (ref 8–23)
BUN: 8 mg/dL (ref 8–23)
BUN: 10 mg/dL (ref 8–23)
CO2: 25 mmol/L (ref 22–32)
CO2: 25 mmol/L (ref 22–32)
CO2: 24 mmol/L (ref 22–32)
Calcium: 8.5 mg/dL — ABNORMAL LOW (ref 8.9–10.3)
Calcium: 8.4 mg/dL — ABNORMAL LOW (ref 8.9–10.3)
Calcium: 8.5 mg/dL — ABNORMAL LOW (ref 8.9–10.3)
Chloride: 105 mmol/L (ref 98–111)
Chloride: 106 mmol/L (ref 98–111)
Chloride: 101 mmol/L (ref 98–111)
Creatinine, Ser: 0.62 mg/dL (ref 0.44–1.00)
Creatinine, Ser: 0.63 mg/dL (ref 0.44–1.00)
Creatinine, Ser: 0.78 mg/dL (ref 0.44–1.00)
GFR calc Af Amer: >60 mL/min (ref >60)
GFR calc Af Amer: >60 mL/min (ref >60)
GFR calc Af Amer: >60 mL/min (ref >60)
GFR calc non Af Amer: >60 mL/min (ref >60)
GFR calc non Af Amer: >60 mL/min (ref >60)
GFR calc non Af Amer: >60 mL/min (ref >60)
Glucose, Bld: 222 mg/dL — ABNORMAL HIGH (ref 70–99)
Glucose, Bld: 243 mg/dL — ABNORMAL HIGH (ref 70–99)
Glucose, Bld: 376 mg/dL — ABNORMAL HIGH (ref 70–99)
Potassium: 3.4 mmol/L — ABNORMAL LOW (ref 3.5–5.1)
Potassium: 3.5 mmol/L (ref 3.5–5.1)
Potassium: 4.1 mmol/L (ref 3.5–5.1)
Sodium: 139 mmol/L (ref 135–145)
Sodium: 139 mmol/L (ref 135–145)
Sodium: 135 mmol/L (ref 135–145)

## 2019-12-25 LAB — GLUCOSE, CAPILLARY
Glucose-Capillary: 207 mg/dL — ABNORMAL HIGH (ref 70–99)
Glucose-Capillary: 219 mg/dL — ABNORMAL HIGH (ref 70–99)
Glucose-Capillary: 230 mg/dL — ABNORMAL HIGH (ref 70–99)
Glucose-Capillary: 248 mg/dL — ABNORMAL HIGH (ref 70–99)
Glucose-Capillary: 324 mg/dL — ABNORMAL HIGH (ref 70–99)
Glucose-Capillary: 349 mg/dL — ABNORMAL HIGH (ref 70–99)

## 2019-12-25 LAB — BETA-HYDROXYBUTYRIC ACID: Beta-Hydroxybutyric Acid: 0.41 mmol/L — ABNORMAL HIGH (ref 0.05–0.27)

## 2019-12-25 LAB — INSULIN, RANDOM: Insulin: 1.8 u[IU]/mL — ABNORMAL LOW (ref 2.6–24.9)

## 2019-12-25 LAB — CK: Total CK: 351 U/L — ABNORMAL HIGH (ref 38–234)

## 2019-12-25 MED ORDER — INSULIN ASPART 100 UNIT/ML ~~LOC~~ SOLN
0.0000 [IU] | Freq: Three times a day (TID) | SUBCUTANEOUS | Status: DC
  Administered 2019-12-25: 11 [IU] via SUBCUTANEOUS
  Administered 2019-12-25: 5 [IU] via SUBCUTANEOUS
  Administered 2019-12-26: 3 [IU] via SUBCUTANEOUS
  Administered 2019-12-26: 2 [IU] via SUBCUTANEOUS
  Administered 2019-12-26: 11 [IU] via SUBCUTANEOUS
  Administered 2019-12-27: 2 [IU] via SUBCUTANEOUS
  Administered 2019-12-27: 3 [IU] via SUBCUTANEOUS
  Administered 2019-12-28: 11 [IU] via SUBCUTANEOUS
  Administered 2019-12-28: 3 [IU] via SUBCUTANEOUS
  Administered 2019-12-29 (×2): 11 [IU] via SUBCUTANEOUS
  Administered 2019-12-30: 15 [IU] via SUBCUTANEOUS
  Administered 2019-12-30: 5 [IU] via SUBCUTANEOUS

## 2019-12-25 MED ORDER — POTASSIUM CHLORIDE 10 MEQ/100ML IV SOLN
10.0000 meq | INTRAVENOUS | Status: AC
  Administered 2019-12-25 (×3): 10 meq via INTRAVENOUS
  Filled 2019-12-25 (×3): qty 100

## 2019-12-25 MED ORDER — INSULIN ASPART 100 UNIT/ML ~~LOC~~ SOLN: 0.0000 [IU] | Freq: Three times a day (TID) | SUBCUTANEOUS | Status: DC

## 2019-12-25 MED ORDER — INSULIN GLARGINE 100 UNIT/ML ~~LOC~~ SOLN
8.0000 [IU] | Freq: Every day | SUBCUTANEOUS | Status: DC
  Administered 2019-12-25: 8 [IU] via SUBCUTANEOUS
  Filled 2019-12-25 (×2): qty 0.08

## 2019-12-25 NOTE — Progress Notes (Addendum)
Subjective:  Patient is examined at recline chair today. Patient is more alert and awake today. Patient denies pain or any complaints. She states that she did give her self 25 units of possibly Basaglar in the morning (unsure that it was yesterday morning or the day before). Patient states she ate before giving Insulin. She states that her morning blood sugar is 89. Patient states that the last time she saw her PCP was six months ago and the next appointment is next week.   Patient states that she has numbness in her hands and bilateral feet. We discussed with patient about the importance of diabetic control and correct administration of insulin. She voices understanding.  Objective:  Vital signs in last 24 hours: Vitals:   12/24/19 2000 12/24/19 2316 12/25/19 0510 12/25/19 0725  BP:  (!) 147/81 115/81 (!) 135/96  Pulse:  80 78 80  Resp: (!) 9 10 10    Temp:  98 F (36.7 C) 97.9 F (36.6 C) 97.8 F (36.6 C)  TempSrc:    Oral  SpO2:  99% 99%   Weight:      Height:        Physical Exam Physical Exam Constitutional:      General: She is not in acute distress. HENT:     Head: Normocephalic.  Eyes:     General: No scleral icterus.    Conjunctiva/sclera: Conjunctivae normal.  Cardiovascular:     Rate and Rhythm: Normal rate and regular rhythm.     Heart sounds: Normal heart sounds.  Abdominal:     General: Bowel sounds are normal.     Palpations: Abdomen is soft.     Tenderness: There is no abdominal tenderness.  Skin:    General: Skin is warm.  Neurological:     Mental Status: She is alert.     Comments: Numbness of bilateral feet. No open wound noted on bilateral feet.   Psychiatric:        Mood and Affect: Mood normal.     Assessment/Plan:  Patient is a 64 yo AAF with PMH of Insulin dependent DM, COPD, hx of DKA, polysubstance use including alcohol and marijuana use, who was brought to the hospital via EMS for hypoglycemia and altered mental status.   Principal  Problem:   Acute metabolic encephalopathy Active Problems:   Hypoglycemia   Uncontrolled diabetes mellitus (HCC)   Ketosis (HCC)   Acute Metabolic encephalopathy - Likely secondary to hypoglycemia as a result of patient's poor compliance to insulin administration. DKA is ruled out: pH 5.18 on VBG, AG 8.  - B-hydroxybutyric acid improved 3.2 - 1.2 - 0.4 - Blood glucose improves 207 so start 8 units of Lantus, sensitive SSI and low carb diet.  - Continue LR 100 cc/h  - Monitor BMP daily and CBG - PT recommended SNF   Insulin dependent DM - Per chart review, patient last saw a PCP in 12/2018 and was on Metformin 500 mg, insulin Aspart 7 units BID and Glargine 38 units daily. Patient reports that she saw her PCP 6 months ago and have a follow up appointment next week.  - Last A1C in 2016 was 13.3. New A1C 10.9.  - Patient is poorly compliant with insulin administration and has had similar episodes of hypoglycemia in the past.  - Patient will need diabetic education prior discharge and follow up with PCP for better management of her diabetes.    Elevated CK - CK came down 615 - 315  -  Continue IVF   Alcohol abuse - Unknown patient's last drink - CIWA without Ativan in place - Add Thiamine and Folic acid - Will talk to patient about alcohol cessation before d/c   Poly substance abuse  - UDS shows positive Cocaine and Marijuana - Will discuss with patient about substance abuse before d/c  Diet: low carb diet IVF: LR 100 cc/h CODE: full DVT : lovenox 40 mg subq  Prior to Admission Living Arrangement: Home, living by herself Anticipated Discharge Location: Home Barriers to Discharge: Diabetic education  Dispo: Anticipated discharge in approximately 1 day(s).   Doran Stabler, DO 12/25/2019, 12:11 PM Pager: [phone] After 5pm on weekdays and 1pm on weekends: On Call pager [phone]

## 2019-12-25 NOTE — Plan of Care (Signed)

## 2019-12-25 NOTE — Evaluation (Signed)
Physical Therapy Evaluation Patient Details Name: Meagan Cummings MRN: [id number] DOB: 1955/12/20 Today's Date: 12/25/2019   History of Present Illness  64 yo female admitted to ED on 7/5 after being found down at home by son. Pt with medical workup for hypoglycemia, AME, weakness. PMH includes DM, PSA, prior hospitalizations for DKA and hypoglycemia.  Clinical Impression   Pt presents with impaired safety awareness/orientation/command following, generalized weakness, impaired balance with history of multiple falls, difficulty performing mobility tasks, and decreased activity tolerance. Pt to benefit from acute PT to address deficits. Pt ambulated short hallway distance with use of RW and mod assist from PT for hallway navigation and correcting LOB. PT recommending SNF to address mobility deficits and high fall risk, pt agreeable. PT to progress mobility as tolerated, and will continue to follow acutely.      Follow Up Recommendations SNF;Supervision/Assistance - 24 hour    Equipment Recommendations  None recommended by PT    Recommendations for Other Services       Precautions / Restrictions Precautions Precautions: Fall Restrictions Weight Bearing Restrictions: No      Mobility  Bed Mobility Overal bed mobility: Needs Assistance Bed Mobility: Supine to Sit     Supine to sit: Min guard;HOB elevated     General bed mobility comments: for safety, increased time and effort with use of bedrails and HOB elevation.  Transfers Overall transfer level: Needs assistance Equipment used: Rolling walker (2 wheeled) Transfers: Sit to/from [employer] Sit to Stand: Min assist Stand pivot transfers: Min assist       General transfer comment: min assist for power up, steadying, and transfer to [facility] for steadying and RW use. assist for slow eccentric lowering onto [facility] with VCs for hand placement when rising/sitting.  Ambulation/Gait Ambulation/Gait assistance:  Min assist;Mod assist Gait Distance (Feet): 60 Feet Assistive device: Rolling walker (2 wheeled) Gait Pattern/deviations: Step-through pattern;Decreased stride length;Trunk flexed;Drifts right/left;Staggering right Gait velocity: decr   General Gait Details: min assist for steadying with occasional mod assist for correction of LOB x3. Verbal and tactile cuing for hallway navigation as pt frequently staggering R and hitting walls/doors, placement in RW.  Stairs            Wheelchair Mobility    Modified Rankin (Stroke Patients Only)       Balance Overall balance assessment: Needs assistance Sitting-balance support: No upper extremity supported Sitting balance-Leahy Scale: Fair     Standing balance support: Bilateral upper extremity supported;During functional activity Standing balance-Leahy Scale: Poor Standing balance comment: reliant on PT and RW assist                             Pertinent Vitals/Pain Pain Assessment: No/denies pain    Home Living Family/patient expects to be discharged to:: Private residence Living Arrangements: Alone Available Help at Discharge: Family;Available PRN/intermittently (son checks on pt 1x/week) Type of Home: Apartment Home Access: Stairs to enter Entrance Stairs-Rails: Right Entrance Stairs-Number of Steps: 14 Home Layout: One level Home Equipment: Walker - 2 wheels;Cane - single point      Prior Function Level of Independence: Independent with assistive device(s)         Comments: pt reports using SPC for ambulation, states she has multiple falls and stumbles at home.     Hand Dominance   Dominant Hand: Right    Extremity/Trunk Assessment   Upper Extremity Assessment Upper Extremity Assessment: Generalized weakness    Lower Extremity  Assessment Lower Extremity Assessment: Generalized weakness    Cervical / Trunk Assessment Cervical / Trunk Assessment: Normal  Communication   Communication: No  difficulties  Cognition Arousal/Alertness: Awake/alert Behavior During Therapy: WFL for tasks assessed/performed Overall Cognitive Status: Impaired/Different from baseline Area of Impairment: Orientation;Memory;Following commands;Safety/judgement;Problem solving                 Orientation Level: Disoriented to;Time;Situation   Memory: Decreased short-term memory Following Commands: Follows one step commands inconsistently Safety/Judgement: Decreased awareness of safety   Problem Solving: Difficulty sequencing;Requires verbal cues;Requires tactile cues General Comments: Pt states the month and year is August 2001, then corrects to 2011. PT oriented pt to July 2021.Pt requires multimodal cuing for safe mobility, pt frequently bumping into environment during gait but does not seem to notice until PT points it out. Pt with urinary incontinence when preparing to get to [facility], soiling the bed in urine prior to transfer.      General Comments      Exercises     Assessment/Plan    PT Assessment Patient needs continued PT services  PT Problem List Decreased strength;Decreased mobility;Decreased activity tolerance;Decreased balance;Decreased knowledge of use of DME;Decreased cognition;Decreased coordination;Decreased safety awareness       PT Treatment Interventions DME instruction;Therapeutic activities;Patient/family education;Therapeutic exercise;Gait training;Functional mobility training;Neuromuscular re-education;Balance training    PT Goals (Current goals can be found in the Care Plan section)  Acute Rehab PT Goals Patient Stated Goal: get stronger, think clearer PT Goal Formulation: With patient Time For Goal Achievement: 01/08/20 Potential to Achieve Goals: Good    Frequency Min 2X/week   Barriers to discharge Decreased caregiver support son checks on pt once a week only, no other support per pt    Co-evaluation               AM-PAC PT "6 Clicks" Mobility   Outcome Measure Help needed turning from your back to your side while in a flat bed without using bedrails?: A Little Help needed moving from lying on your back to sitting on the side of a flat bed without using bedrails?: A Little Help needed moving to and from a bed to a chair (including a wheelchair)?: A Little Help needed standing up from a chair using your arms (e.g., wheelchair or bedside chair)?: A Little Help needed to walk in hospital room?: A Lot Help needed climbing 3-5 steps with a railing? : Total 6 Click Score: 15    End of Session Equipment Utilized During Treatment: Gait belt Activity Tolerance: Patient limited by fatigue;Patient tolerated treatment well Patient left: in chair;with chair alarm set;with call bell/phone within reach Nurse Communication: Other (comment);Mobility status (IV occluded) PT Visit Diagnosis: Muscle weakness (generalized) (M62.81);Difficulty in walking, not elsewhere classified (R26.2);Repeated falls (R29.6)    Time: 9629-5284 PT Time Calculation (min) (ACUTE ONLY): 33 min   Charges:   PT Evaluation $PT Eval Low Complexity: 1 Low PT Treatments $Gait Training: 8-22 mins        Iviana Blasingame E, PT Acute Rehabilitation Services Pager [phone]  Office [phone]  Berel Najjar D Jhamari Markowicz 12/25/2019, 10:42 AM

## 2019-12-25 NOTE — Progress Notes (Signed)
Inpatient Diabetes Program Recommendations  AACE/ADA: New Consensus Statement on Inpatient Glycemic Control (2015)  Target Ranges:  Prepandial:   less than 140 mg/dL      Peak postprandial:   less than 180 mg/dL (1-2 hours)      Critically ill patients:  140 - 180 mg/dL   Lab Results  Component Value Date   GLUCAP 349 (H) 12/25/2019   HGBA1C 10.9 (H) 12/24/2019    Review of Glycemic Control Results for Meagan Cummings, Meagan Cummings (MRN [id number]) as of 12/25/2019 13:01  Ref. Range 12/24/2019 18:47 12/24/2019 21:14 12/25/2019 06:00 12/25/2019 07:23 12/25/2019 11:39  Glucose-Capillary Latest Ref Range: 70 - 99 mg/dL 54 (L) 789 (H) 381 (H) 230 (H) 349 (H)   Diabetes history:  DM1(does not make insulin.  Needs correction, basal and meal coverage)  Outpatient Diabetes medications:  Lantus 25 units daily  Novolog SSI tid  Current orders for Inpatient glycemic control:  Lantus 8 units daily Novolog 0-15 units tid   Note:  Spoke with patient at bedside.  She had a hypoglycemic episode at home and fell.  EMS said her blood sugar was in the 30's.  She verified she takes above outpatient DM meds.  She states her MD prescribed her Lantus 38 but this is too much for her.  She states she does not have frequent lows-maybe 2 per year.  She denies difficulties obtaining medications.  She does like to drink apple juice.  Encouraged her to avoid sugary drinks unless she is low.  Reviewed patient's current A1c of 10.9% (average BS of 266 mg/dl). Explained what a A1c is and what it measures. Also reviewed goal A1c with patient, importance of good glucose control @ home, and blood sugar goals.  She states her last A1C was around 13%.  She does not like to check her blood sugar and admits she rarely checks.  Asked Dr. Cyndie Chime if I could place a Freestyle Libre sensor on patient as she would benefit from this at discharge.  He gave a verbal order to place the sensor.  Patient was very excited and appreciative.  Walked patient  through process of placing sensor.  It will last for 14 days and I provided her with an extra sensor for when this one expires.  Asked her to check her BS if her sensor reading doesn't seem correct in relation to how she feels.    We reviewed The Plate Method and CHO's and foods that contain CHO's.     Will continue to follow while inpatient.  Thank you, Dulce Sellar, RN, BSN Diabetes Coordinator Inpatient Diabetes Program [phone] (team pager from 8a-5p)

## 2019-12-25 NOTE — Evaluation (Signed)
Clinical/Bedside Swallow Evaluation Patient Details  Name: Meagan Cummings MRN: [id number] Date of Birth: Oct 30, 1955  Today's Date: 12/25/2019 Time: SLP Start Time (ACUTE ONLY): 0844 SLP Stop Time (ACUTE ONLY): 0855 SLP Time Calculation (min) (ACUTE ONLY): 11 min  Past Medical History:  Past Medical History:  Diagnosis Date  . Depression   . Diabetes mellitus without complication [facility])    Past Surgical History:  Past Surgical History:  Procedure Laterality Date  . ABDOMINAL HYSTERECTOMY     HPI:  Patient is a 64 yo AAF with PMH of Insulin dependent DM, COPD, hx of DKA, polysubstance use including alcohol and marijuana use, who was brought to the hospital via EMS for hypoglycemia and altered mental status. CXR no active disease.   Assessment / Plan / Recommendation Clinical Impression  Per chart notes from initial AMS, her mentation appears much improved. Upper denture plate cleaned donned; has 4 lower teethn and oral-motor, phonatory respiratory systems appear intact. There was no concern of aspiration across multiple straw sips thin and solid. Recommend continue regular/thin, pills with liquid. No follow up needed.  SLP Visit Diagnosis: Dysphagia, unspecified (R13.10)    Aspiration Risk  Mild aspiration risk    Diet Recommendation Regular;Thin liquid   Liquid Administration via: Cup;Straw Medication Administration: Whole meds with liquid Supervision: Patient able to self feed Compensations: Slow rate;Small sips/bites Postural Changes: Seated upright at 90 degrees    Other  Recommendations Oral Care Recommendations: Oral care BID   Follow up Recommendations None      Frequency and Duration            Prognosis        Swallow Study   General HPI: Patient is a 64 yo AAF with PMH of Insulin dependent DM, COPD, hx of DKA, polysubstance use including alcohol and marijuana use, who was brought to the hospital via EMS for hypoglycemia and altered mental status. CXR  no active disease. Type of Study: Bedside Swallow Evaluation Previous Swallow Assessment:  (none) Diet Prior to this Study: Regular;Thin liquids Temperature Spikes Noted: No Respiratory Status: Room air History of Recent Intubation: No Behavior/Cognition: Alert;Cooperative;Pleasant mood Oral Cavity Assessment: Within Functional Limits Oral Care Completed by SLP: No Oral Cavity - Dentition: Dentures, top (4 lower teeth) Vision: Functional for self-feeding Self-Feeding Abilities: Able to feed self Patient Positioning: Upright in bed Baseline Vocal Quality: Normal Volitional Cough: Strong Volitional Swallow: Able to elicit    Oral/Motor/Sensory Function Overall Oral Motor/Sensory Function: Within functional limits   Ice Chips Ice chips: Not tested   Thin Liquid Thin Liquid: Within functional limits Presentation: Straw    Nectar Thick Nectar Thick Liquid: Not tested   Honey Thick Honey Thick Liquid: Not tested   Puree Puree: Not tested   Solid     Solid: Within functional limits      Meagan Cummings 12/25/2019,9:00 AM   Meagan Cummings.Ed Nurse, children's [phone] Office [phone]

## 2019-12-26 LAB — BASIC METABOLIC PANEL
Anion gap: 9 (ref 5–15)
BUN: 11 mg/dL (ref 8–23)
CO2: 24 mmol/L (ref 22–32)
Calcium: 8.6 mg/dL — ABNORMAL LOW (ref 8.9–10.3)
Chloride: 104 mmol/L (ref 98–111)
Creatinine, Ser: 0.68 mg/dL (ref 0.44–1.00)
GFR calc Af Amer: >60 mL/min (ref >60)
GFR calc non Af Amer: >60 mL/min (ref >60)
Glucose, Bld: 326 mg/dL — ABNORMAL HIGH (ref 70–99)
Potassium: 3.9 mmol/L (ref 3.5–5.1)
Sodium: 137 mmol/L (ref 135–145)

## 2019-12-26 LAB — CBC
HCT: 36.7 % (ref 36.0–46.0)
Hemoglobin: 11.6 g/dL — ABNORMAL LOW (ref 12.0–15.0)
MCH: 27.4 pg (ref 26.0–34.0)
MCHC: 31.6 g/dL (ref 30.0–36.0)
MCV: 86.8 fL (ref 80.0–100.0)
Platelets: 222 10*3/uL (ref 150–400)
RBC: 4.23 MIL/uL (ref 3.87–5.11)
RDW: 14.2 % (ref 11.5–15.5)
WBC: 6 10*3/uL (ref 4.0–10.5)
nRBC: 0 % (ref 0.0–0.2)

## 2019-12-26 LAB — GLUCOSE, CAPILLARY
Glucose-Capillary: 272 mg/dL — ABNORMAL HIGH (ref 70–99)
Glucose-Capillary: 319 mg/dL — ABNORMAL HIGH (ref 70–99)
Glucose-Capillary: 123 mg/dL — ABNORMAL HIGH (ref 70–99)
Glucose-Capillary: 169 mg/dL — ABNORMAL HIGH (ref 70–99)
Glucose-Capillary: 338 mg/dL — ABNORMAL HIGH (ref 70–99)

## 2019-12-26 MED ORDER — GABAPENTIN 400 MG PO CAPS
400.0000 mg | ORAL_CAPSULE | Freq: Once | ORAL | Status: AC | PRN
  Administered 2019-12-26: 400 mg via ORAL
  Filled 2019-12-26: qty 1

## 2019-12-26 MED ORDER — QUETIAPINE FUMARATE 100 MG PO TABS
100.0000 mg | ORAL_TABLET | Freq: Every day | ORAL | Status: AC
  Administered 2019-12-26: 100 mg via ORAL
  Filled 2019-12-26: qty 1

## 2019-12-26 MED ORDER — INSULIN GLARGINE 100 UNIT/ML ~~LOC~~ SOLN
20.0000 [IU] | Freq: Every day | SUBCUTANEOUS | Status: DC
  Administered 2019-12-26 – 2019-12-29 (×4): 20 [IU] via SUBCUTANEOUS
  Filled 2019-12-26 (×5): qty 0.2

## 2019-12-26 NOTE — Hospital Course (Addendum)
#  Acute metabolic encephalopathy secondary to hypoglycemia and ketosis: Meagan Cummings is a 64 year old woman with medical history significant for insulin-dependent diabetes mellitus, COPD, polysubstance use disorder including alcohol and marijuana use who presented to the emergency department with hypoglycemia and altered mental status.  On subsequent days and further clarification, she reports that she blacked out after administering 25 units of her long-acting insulin. On arrival, labs did not reviewed the she was in diabetic ketoacidosis.  She was treated with IV fluids and insulin.  She was evaluated by physical therapy who recommended discharge to a skilled nursing facility and was discharged on 12/28/19    #Insulin-dependent diabetes mellitus: Per chart review, her diabetes regimen prior to arrival were Metformin 500 mg daily, NovoLog sliding scale and glargine 38 units daily.  Her hemoglobin A1c during this admission was found to be at 10.9%. She was started on 10 units of Lantus and was increased to 20 units daily with sliding scale Insulin. Freestyle Josephine Igo was started to help with glucose control. She was discharged on 12/28/19

## 2019-12-26 NOTE — Progress Notes (Signed)
Subjective:   Patient examined at bedside this morning. She feels well and has been eating. We discussed our recommendation of SNF. She initially is unwilling to go citing poor prior experiences at them, but is more willing at the end of our discussion after being reassured that it will be short term.   Discussed medical history. She used to see an endocrinologist years ago but has not seen one recently. Had an appointment with one recently but missed the appointment. States she was diagnosed with Type I diabetes about 10 years ago.  Objective:  Vital signs in last 24 hours: Vitals:   12/25/19 1214 12/25/19 1617 12/25/19 2041 12/26/19 0740  BP: 113/76  115/74 (!) 140/93  Pulse: 81 79 83 75  Resp:    16  Temp: 98.3 F (36.8 C) 98.3 F (36.8 C) 98.4 F (36.9 C) 98.4 F (36.9 C)  TempSrc: Oral Oral    SpO2:   97% 97%  Weight:      Height:        Physical Exam No abdominal tenderness Physical Exam Constitutional:      Appearance: Normal appearance.  HENT:     Head: Normocephalic.  Eyes:     General: No scleral icterus. Cardiovascular:     Rate and Rhythm: Normal rate and regular rhythm.     Heart sounds: Normal heart sounds.  Pulmonary:     Effort: No respiratory distress.  Abdominal:     General: Abdomen is flat. Bowel sounds are normal.     Palpations: Abdomen is soft.     Tenderness: There is no abdominal tenderness. There is no guarding.  Musculoskeletal:     Cervical back: Normal range of motion.     Right lower leg: No edema.     Left lower leg: No edema.  Skin:    General: Skin is warm.  Neurological:     Mental Status: She is alert.  Psychiatric:        Mood and Affect: Mood normal.    Assessment/Plan: Meagan Cummings is a 64 yo AAF with PMH of Insulin dependent DM, COPD, hx of DKA, polysubstance use including alcohol and marijuana use, who was brought to the hospital via EMS for hypoglycemia and altered mental status.   Principal Problem:    Acute metabolic encephalopathy Active Problems:   Hypoglycemia   Uncontrolled diabetes mellitus (HCC)   Ketosis (HCC)   Acute metabolic encephalopathy  Acute Metabolic encephalopathy - Likely secondary to hypoglycemia as a result of patient's poor compliance to insulin administration. DKA is ruled out: pH 5.18 on VBG, AG 8.  - B-hydroxybutyric acid improved 3.2 - 1.2 - 0.4 - Continue LR 100 cc/h  - CBG 248-326. Increase Lantus to 20 units daily with SSI.  - Monitor BMP daily and CBG - PT recommended SNF. Patient agrees to go. Social worker working on placement.  Insulin dependent DM - Per chart review, patient last saw a PCP in 12/2018 and was on Metformin 500 mg, insulin Aspart 7 units BID and Glargine 38 units daily. Patient reports that she saw her PCP 6 months ago and have a follow up appointment next week. Her endocrinology office visit in 2018 shows that patient has type 1 DM. Will call to clarify the information as well arranging follow up appointment.  - Last A1C in 2016 was 13.3. New A1C 10.9.  - Patient is poorly compliant with insulin administration and has had similar episodes of hypoglycemia in the past.  - Diabetic  coordinator started a [employer] sensor, patient is pleased. - Patient will need to follow up with PCP for better management of her diabetes.   Elevated CK - CK came down 615 - 315  - Continue IVF  Alcohol abuse - Unknown patient's last drink - CIWA without Ativan in place - Add Thiamine and Folic acid - Will talk to patient about alcohol cessation before d/c  Poly substance abuse  - UDS shows positive Cocaine and Marijuana - Will discuss with patient about substance abuse before d/c  Diet: low carb diet IVF: LR 100 cc/h CODE: full DVT : lovenox 40 mg subq  Prior to Admission Living Arrangement: Home, living by herself Anticipated Discharge Location: Home Barriers to Discharge: Diabetic education  Dispo: Anticipated discharge in  approximately 1 day(s).   Doran Stabler, DO 12/26/2019, 9:37 AM Pager: [phone] After 5pm on weekdays and 1pm on weekends: On Call pager [phone]

## 2019-12-27 LAB — BASIC METABOLIC PANEL
Anion gap: 8 (ref 5–15)
BUN: 7 mg/dL — ABNORMAL LOW (ref 8–23)
CO2: 26 mmol/L (ref 22–32)
Calcium: 8.6 mg/dL — ABNORMAL LOW (ref 8.9–10.3)
Chloride: 108 mmol/L (ref 98–111)
Creatinine, Ser: 0.63 mg/dL (ref 0.44–1.00)
GFR calc Af Amer: >60 mL/min (ref >60)
GFR calc non Af Amer: >60 mL/min (ref >60)
Glucose, Bld: 214 mg/dL — ABNORMAL HIGH (ref 70–99)
Potassium: 3.8 mmol/L (ref 3.5–5.1)
Sodium: 142 mmol/L (ref 135–145)

## 2019-12-27 LAB — GLUCOSE, CAPILLARY
Glucose-Capillary: 127 mg/dL — ABNORMAL HIGH (ref 70–99)
Glucose-Capillary: 162 mg/dL — ABNORMAL HIGH (ref 70–99)
Glucose-Capillary: 97 mg/dL (ref 70–99)
Glucose-Capillary: 251 mg/dL — ABNORMAL HIGH (ref 70–99)

## 2019-12-27 LAB — SARS CORONAVIRUS 2 (TAT 6-24 HRS): SARS Coronavirus 2: NEGATIVE

## 2019-12-27 MED ORDER — QUETIAPINE FUMARATE 100 MG PO TABS
100.0000 mg | ORAL_TABLET | Freq: Every day | ORAL | Status: DC
  Administered 2019-12-27 – 2019-12-31 (×5): 100 mg via ORAL
  Filled 2019-12-27 (×6): qty 1

## 2019-12-27 MED ORDER — DULOXETINE HCL 60 MG PO CPEP
60.0000 mg | ORAL_CAPSULE | Freq: Every day | ORAL | Status: DC
  Administered 2019-12-27 – 2020-01-01 (×6): 60 mg via ORAL
  Filled 2019-12-27 (×6): qty 1

## 2019-12-27 MED ORDER — GABAPENTIN 400 MG PO CAPS
400.0000 mg | ORAL_CAPSULE | Freq: Three times a day (TID) | ORAL | Status: DC
  Administered 2019-12-27 – 2020-01-01 (×16): 400 mg via ORAL
  Filled 2019-12-27 (×17): qty 1

## 2019-12-27 NOTE — Plan of Care (Signed)
  Problem: Education: Goal: Knowledge of General Education information will improve Description: Including pain rating scale, medication(s)/side effects and non-pharmacologic comfort measures Outcome: Progressing   Problem: Health Behavior/Discharge Planning: Goal: Ability to manage health-related needs will improve Outcome: Progressing   Problem: Clinical Measurements: Goal: Ability to maintain clinical measurements within normal limits will improve Outcome: Progressing Goal: Will remain free from infection Outcome: Progressing Goal: Diagnostic test results will improve Outcome: Progressing Goal: Respiratory complications will improve Outcome: Progressing Goal: Cardiovascular complication will be avoided Outcome: Progressing   Problem: Clinical Measurements: Goal: Will remain free from infection Outcome: Progressing   Problem: Clinical Measurements: Goal: Diagnostic test results will improve Outcome: Progressing   Problem: Clinical Measurements: Goal: Respiratory complications will improve Outcome: Progressing   Problem: Activity: Goal: Risk for activity intolerance will decrease Outcome: Progressing   Problem: Nutrition: Goal: Adequate nutrition will be maintained Outcome: Progressing   Problem: Elimination: Goal: Will not experience complications related to bowel motility Outcome: Progressing Goal: Will not experience complications related to urinary retention Outcome: Progressing   Problem: Pain Managment: Goal: General experience of comfort will improve Outcome: Progressing

## 2019-12-27 NOTE — [place] FL2 (Signed)
[place] MEDICAID FL2 LEVEL OF CARE SCREENING TOOL     IDENTIFICATION  Patient Name: Meagan Cummings Birthdate: [birth year] Sex: female Admission Date (Current Location): 12/24/2019  [facility] and [place] Number:  Producer, television/film/video and Address:  The . [facility], [address] [place], [place] [address]      Provider Number: [id number]  Attending Physician Name and Address:  Anne Shutter, MD  Relative Name and Phone Number:  Thomes Dinning (mother) [phone]    Current Level of Care: Hospital Recommended Level of Care: Skilled Nursing Facility Prior Approval Number:    Date Approved/Denied:   PASRR Number:    Discharge Plan: SNF    Current Diagnoses: Patient Active Problem List   Diagnosis Date Noted  . Ketosis (HCC) 12/25/2019  . Acute metabolic encephalopathy 12/25/2019  . Hypoglycemia 12/24/2019  . Acute metabolic encephalopathy 12/24/2019  . Uncontrolled diabetes mellitus (HCC) 12/24/2019  . Acute renal failure syndrome (HCC)   . Diabetic ketoacidosis (HCC)     Orientation RESPIRATION BLADDER Height & Weight     Self, Time, Situation, Place  Normal Continent Weight: 54.3 kg Height:  5\' 2"  (157.5 cm)  BEHAVIORAL SYMPTOMS/MOOD NEUROLOGICAL BOWEL NUTRITION STATUS     (n/a) Continent Diet (Heart Healthy Carb Modified)  AMBULATORY STATUS COMMUNICATION OF NEEDS Skin   Extensive Assist Verbally Normal                       Personal Care Assistance Level of Assistance  Bathing, Dressing, Total care, Feeding Bathing Assistance: Maximum assistance Feeding assistance: Independent (after set up) Dressing Assistance: Limited assistance Total Care Assistance: Limited assistance   Functional Limitations Info  Sight, Hearing, Speech Sight Info: Adequate Hearing Info: Adequate Speech Info: Adequate    SPECIAL CARE FACTORS FREQUENCY  PT (By licensed PT), OT (By licensed OT)     PT Frequency: 5X OT Frequency: 5X             Contractures Contractures Info: Not present    Additional Factors Info  Code Status, Allergies, Psychotropic, Insulin Sliding Scale, Isolation Precautions, Suctioning Needs Code Status Info: Full Allergies Info: cat/dog hair Psychotropic Info: On cymbalta and seroquel history of depression Insulin Sliding Scale Info: Sliding scale before meals and at bedtime Isolation Precautions Info: none Suctioning Needs: n/a   Current Medications (12/27/2019):  This is the current hospital active medication list Current Facility-Administered Medications  Medication Dose Route Frequency Provider Last Rate Last Admin  . dextrose 50 % solution 0-50 mL  0-50 mL Intravenous PRN 06-02-1974, MD   50 mL at 12/24/19 1905  . DULoxetine (CYMBALTA) DR capsule 60 mg  60 mg Oral Daily Seawell, Jaimie A, DO   60 mg at 12/27/19 0848  . enoxaparin (LOVENOX) injection 40 mg  40 mg Subcutaneous Q24H Agyei, Obed K, MD   40 mg at 12/26/19 1845  . folic acid (FOLVITE) tablet 1 mg  1 mg Oral Daily 02/26/20, MD   1 mg at 12/27/19 0846  . gabapentin (NEURONTIN) capsule 400 mg  400 mg Oral TID Seawell, Jaimie A, DO      . insulin aspart (novoLOG) injection 0-15 Units  0-15 Units Subcutaneous TID WC 08-20-1984, MD   2 Units at 12/27/19 0847  . insulin glargine (LANTUS) injection 20 Units  20 Units Subcutaneous Daily 02/27/20, MD   20 Units at 12/27/19 0848  . lactated ringers infusion   Intravenous Continuous  Yvette Rack, MD 100 mL/hr at 12/27/19 0716 New Bag at 12/27/19 0716  . QUEtiapine (SEROQUEL) tablet 100 mg  100 mg Oral QHS Seawell, Jaimie A, DO      . thiamine tablet 100 mg  100 mg Oral Daily Agyei, Obed K, MD   100 mg at 12/27/19 5188     Discharge Medications: Please see discharge summary for a list of discharge medications.  Relevant Imaging Results:  Relevant Lab Results:   Additional Information SS# [id number]  Beckie Busing, RN

## 2019-12-27 NOTE — TOC Progression Note (Signed)
Transition of Care [facility]) - Progression Note    Patient Details  Name: Meagan Cummings MRN: [id number] Date of Birth: June 26, 1955  Transition of Care [facility]) CM/SW Contact  Beckie Busing, RN Phone Number: [phone]  12/27/2019, 2:33 PM  Clinical Narrative:    Bed offer from [facility]. Patient has accepted. Additional info faxed for PASRR. MD notified for new covid test. Will plan for discharge tomorrow providing PASRR is approved.          Expected Discharge Plan and Services                                                 Social Determinants of Health (SDOH) Interventions    Readmission Risk Interventions No flowsheet data found.

## 2019-12-27 NOTE — Plan of Care (Signed)

## 2019-12-27 NOTE — Progress Notes (Signed)
Inpatient Diabetes Program Recommendations  AACE/ADA: New Consensus Statement on Inpatient Glycemic Control (2015)  Target Ranges:  Prepandial:   less than 140 mg/dL      Peak postprandial:   less than 180 mg/dL (1-2 hours)      Critically ill patients:  140 - 180 mg/dL   Lab Results  Component Value Date   GLUCAP 162 (H) 12/27/2019   HGBA1C 10.9 (H) 12/24/2019    Review of Glycemic Control Results for IDALI, LAFEVER (MRN [id number]) as of 12/27/2019 14:27  Ref. Range 12/26/2019 16:35 12/26/2019 20:57 12/27/2019 08:07 12/27/2019 12:33  Glucose-Capillary Latest Ref Range: 70 - 99 mg/dL 737 (H) 106 (H) 269 (H) 162 (H)   Diabetes history:  DM1(does not make insulin.  Needs correction, basal and meal coverage)  Outpatient Diabetes medications:  Lantus 25 units daily  Novolog SSI tid  Current orders for Inpatient glycemic control:  Lantus 20 units daily Novolog 0-15 units tid  Consider adding Novolog 0-5 units QHS.   Thanks, Lujean Rave, MSN, RNC-OB Diabetes Coordinator [phone] (8a-5p)

## 2019-12-27 NOTE — Progress Notes (Addendum)
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 She is ready to go to SNF and states her family is hoping she'll get to build up her strength there.   Objective:  Vital signs in last 24 hours: Vitals:   12/26/19 0740 12/26/19 1635 12/26/19 2228 12/27/19 0812  BP: (!) 140/93 125/84 (!) 150/89 130/89  Pulse: 75 76 75   Resp: 16 16 16 19   Temp: 98.4 F (36.9 C)  (!) 97.5 F (36.4 C) 98.7 F (37.1 C)  TempSrc:   Oral Oral  SpO2: 97% 98% 98% 96%  Weight:      Height:        Physical Exam  Physical Exam Constitutional:      General: She is not in acute distress.    Appearance: Normal appearance.  HENT:     Head: Normocephalic.  Eyes:     General: No scleral icterus. Cardiovascular:     Rate and Rhythm: Normal rate and regular rhythm.     Heart sounds: Normal heart sounds.  Pulmonary:     Effort: No respiratory distress.  Abdominal:     Palpations: Abdomen is soft.     Tenderness: There is no abdominal tenderness. There is no guarding.  Musculoskeletal:     Cervical back: Normal range of motion.     Right lower leg: No edema.     Left lower leg: No edema.  Neurological:     Mental Status: She is alert.  Psychiatric:        Mood and Affect: Mood normal.     Assessment/Plan: Meagan Cummings is a 64 yo AAF with PMH of Insulin dependent DM, COPD, hx of DKA, polysubstance use including alcohol and marijuana use, who was brought to the hospital via EMS for acute mental encephalopathy secondary to hypoglycemia. Patient is waiting for SNF placement.   Principal Problem:   Acute metabolic encephalopathy Active Problems:   Hypoglycemia   Uncontrolled diabetes mellitus (HCC)   Ketosis (HCC)   Acute metabolic encephalopathy  Acute Metabolic encephalopathy- resolved - Likely secondary to hypoglycemia as a result of patient's poor compliance to insulin administration. DKA  is ruled out: pH 7.518 on VBG, AG 8.  - B-hydroxybutyric acid improved 3.2 - 1.2 - 0.4 - Will stop LR 100 cc/h  - Monitor BMP daily and CBG - PT recommended SNF. There is a bed offer from [facility] and patient has accepted. Plan to discharge tomorrow.    Insulin dependent DM - Per chart review, patient last saw a PCP in 12/2018 and was on Metformin 500 mg, insulin Aspart 7 units BID and Glargine 38 units daily. Patient reports that she saw her PCP 6 months ago and have a follow up appointment next week. Her endocrinology office visit in 2018 shows that patient has type 1 DM.  - Last A1C in 2016 was 13.3. New A1C 10.9.  - Patient is poorly compliant with insulin administration and has had similar episodes of hypoglycemia in the past.  - Diabetic coordinator started a Freestyle Libre sensor, patient is pleased. - Continue 20 units Lantus daily and Novolog SSI TID  - Patient will need to follow up with PCP for better management of her diabetes.    Elevated CK - CK came down 615 - 315  - Continue IVF   Alcohol abuse - Unknown patient's last drink - CIWA without Ativan in place -  Add Thiamine and Folic acid - Will talk to patient about alcohol cessation before d/c   Poly substance abuse  - UDS shows positive Cocaine and Marijuana - Will discuss with patient about substance abuse before d/c   Diet: low carb diet IVF: LR 100 cc/h CODE: full DVT : lovenox 40 mg subq   Prior to Admission Living Arrangement: Home, living by herself Anticipated Discharge Location: SNF Barriers to Discharge: SNF placement Dispo: Anticipated discharge in approximately 1 day(s).  Doran Stabler, DO 12/27/2019, 8:22 AM Pager: [phone] After 5pm on weekdays and 1pm on weekends: On Call pager [phone]

## 2019-12-28 LAB — CBC
HCT: 34.3 % — ABNORMAL LOW (ref 36.0–46.0)
Hemoglobin: 10.8 g/dL — ABNORMAL LOW (ref 12.0–15.0)
MCH: 27.6 pg (ref 26.0–34.0)
MCHC: 31.5 g/dL (ref 30.0–36.0)
MCV: 87.5 fL (ref 80.0–100.0)
Platelets: 221 10*3/uL (ref 150–400)
RBC: 3.92 MIL/uL (ref 3.87–5.11)
RDW: 14 % (ref 11.5–15.5)
WBC: 4.4 10*3/uL (ref 4.0–10.5)
nRBC: 0 % (ref 0.0–0.2)

## 2019-12-28 LAB — GLUCOSE, CAPILLARY
Glucose-Capillary: 213 mg/dL — ABNORMAL HIGH (ref 70–99)
Glucose-Capillary: 308 mg/dL — ABNORMAL HIGH (ref 70–99)
Glucose-Capillary: 171 mg/dL — ABNORMAL HIGH (ref 70–99)
Glucose-Capillary: 224 mg/dL — ABNORMAL HIGH (ref 70–99)

## 2019-12-28 LAB — BASIC METABOLIC PANEL
Anion gap: 7 (ref 5–15)
BUN: 9 mg/dL (ref 8–23)
CO2: 27 mmol/L (ref 22–32)
Calcium: 8.4 mg/dL — ABNORMAL LOW (ref 8.9–10.3)
Chloride: 104 mmol/L (ref 98–111)
Creatinine, Ser: 0.53 mg/dL (ref 0.44–1.00)
GFR calc Af Amer: >60 mL/min (ref >60)
GFR calc non Af Amer: >60 mL/min (ref >60)
Glucose, Bld: 270 mg/dL — ABNORMAL HIGH (ref 70–99)
Potassium: 3.5 mmol/L (ref 3.5–5.1)
Sodium: 138 mmol/L (ref 135–145)

## 2019-12-28 MED ORDER — BASAGLAR KWIKPEN 100 UNIT/ML ~~LOC~~ SOPN: 20.0000 [IU] | PEN_INJECTOR | Freq: Every day | SUBCUTANEOUS | 2 refills | Status: DC

## 2019-12-28 NOTE — Discharge Summary (Addendum)
Name: Meagan Cummings MRN: [id number] DOB: Feb 05, 1956 64 y.o. PCP: Patient, No Pcp Per  Date of Admission: 12/24/2019 10:27 AM Date of Discharge: 12/28/19 Attending Physician: Anne Shutter, MD  Discharge Diagnosis: 1. Acute metabolic encephalopathy 2. Hypoglycemia 3. Uncontrolled diabetes mellitus 4. Ketosis   Discharge Medications: Allergies as of 12/28/2019       Reactions   Other Rash   Pt gets rash on face and chest from cat and dog hair        Medication List     TAKE these medications    Accu-Chek Aviva Plus test strip Generic drug: glucose blood 1 each 4 (four) times daily.   Basaglar KwikPen 100 UNIT/ML Inject 0.2 mLs (20 Units total) into the skin daily. What changed: how much to take   DULoxetine 60 MG capsule Commonly known as: CYMBALTA Take 60 mg by mouth daily.   fluticasone 50 MCG/ACT nasal spray Commonly known as: FLONASE Place 1 spray into both nostrils daily.   gabapentin 400 MG capsule Commonly known as: NEURONTIN Take 800 mg by mouth 3 (three) times daily.   insulin aspart 100 UNIT/ML FlexPen Commonly known as: NovoLOG FlexPen Before each meal 3 times a day, 140-199 - 2 units, 200-250 - 4 units, 251-299 - 6 units,  300-349 - 8 units,  350 or above 10 units. Insulin PEN if approved, provide syringes and needles if needed.   QUEtiapine 100 MG tablet Commonly known as: SEROQUEL Take 100 mg by mouth at bedtime.   tiotropium 18 MCG inhalation capsule Commonly known as: SPIRIVA Place 18 mcg into inhaler and inhale daily.        Disposition and follow-up:   Meagan Cummings was discharged from [facility] in Stable condition.  At the hospital follow up visit please address:  1.  1. Acute metabolic encephalopathy      - Secondary to hypoglycemia. Please follow up with PCP and endocrinology for education and tighter control of diabetes.       2. Uncontrolled diabetes      - Continue using the  [employer]. Follow up with PCP and endocrinology for diabetes management. Please follow instruction for Insulin use.       3. Polysubstance use      - Follow up on alcohol and drug use, advised cutting back on alcohol and cessation of cocaine and marijuana  2.  Labs / imaging needed at time of follow-up: NA  3.  Pending labs/ test needing follow-up: NA  Follow-up Appointments:  Contact information for after-discharge care     Destination     [facility] .   Service: Skilled Nursing [address] [place] [address] [phone]                     Hospital Course by problem list: 1. #Acute metabolic encephalopathy secondary to hypoglycemia and ketosis: Meagan Cummings is a 64 year old woman with medical history significant for insulin-dependent diabetes mellitus, COPD, polysubstance use disorder including alcohol and marijuana use who presented to the emergency department with hypoglycemia and altered mental status.  On subsequent days and further clarification, she reports that she blacked out after administering 25 units of her long-acting insulin. Hypoglycemia resolved prior to arrival and did not recur.   She has a history of hypoglycemic episodes, recommended consistency with meals and insulin and avoidance of drugs and alcohol that may be complicating her ability to care for herself. She was  evaluated by physical therapy who recommended discharge to a skilled nursing facility and was discharged on 12/28/19.    #Insulin-dependent diabetes mellitus: Per chart review, her diabetes regimen prior to arrival were Metformin 500 mg daily, NovoLog sliding scale and glargine 38 units daily.  Her hemoglobin A1c during this admission was found to be at 10.9%. She was started on 10 units of Lantus and was increased to 20 units daily with sliding scale Insulin. Freestyle Josephine Igo was started to help with glucose monitoring. She should  follow up with her PCP and her endocrinologist for further management of her poorly controlled diabetes with ongoing risk of both high and low glucose.     Discharge Vitals:   BP 132/87 (BP Location: Right Arm)   Pulse 64   Temp 98.1 F (36.7 C) (Oral)   Resp 16   Ht 5\' 2"  (1.575 m)   Wt 54.3 kg   SpO2 96%   BMI 21.90 kg/m   Pertinent Labs, Studies, and Procedures:  CBC Latest Ref Rng & Units 12/28/2019 12/26/2019 12/25/2019  WBC 4.0 - 10.5 K/uL 4.4 6.0 7.9  Hemoglobin 12.0 - 15.0 g/dL 10.8(L) 11.6(L) 11.8(L)  Hematocrit 36 - 46 % 34.3(L) 36.7 36.3  Platelets 150 - 400 K/uL 221 222 231   BMP Latest Ref Rng & Units 12/28/2019 12/27/2019 12/26/2019  Glucose 70 - 99 mg/dL 02/26/2020) 161(W) 960(A)  BUN 8 - 23 mg/dL 9 7(L) 11  Creatinine 540(J - 1.00 mg/dL 8.11 9.14 7.82  Sodium 135 - 145 mmol/L 138 142 137  Potassium 3.5 - 5.1 mmol/L 3.5 3.8 3.9  Chloride 98 - 111 mmol/L 104 108 104  CO2 22 - 32 mmol/L 27 26 24   Calcium 8.9 - 10.3 mg/dL 9.56) ) 2.1(H)   Blood Culture    Component Value Date/Time   SDES BLOOD RIGHT HAND 12/24/2019 2024   SPECREQUEST  12/24/2019 2024    BOTTLES DRAWN AEROBIC ONLY Blood Culture results may not be optimal due to an inadequate volume of blood received in culture bottles   CULT  12/24/2019 2024    NO GROWTH 3 DAYS Performed at [facility], [address], [place], [address] [place]    REPTSTATUS PENDING 12/24/2019 2024    Discharge Instructions: Discharge Instructions     Call MD for:  persistant dizziness or light-headedness   Complete by: As directed    Diet - low sodium heart healthy   Complete by: As directed    Discharge instructions   Complete by: As directed    Thank you allow 02/24/2020 to take care of you during this admission.  Please follow up with your PCP after discharge to management your diabetes. Please give 20 units of Insulin Glargine daily. Continue short acting Insulin as instructions.   Increase activity slowly   Complete by:  As directed        Signed: 2025, DO 12/28/2019, 11:28 AM   Pager: [phone]

## 2019-12-28 NOTE — Progress Notes (Signed)
Inpatient Diabetes Program Recommendations  AACE/ADA: New Consensus Statement on Inpatient Glycemic Control (2015)  Target Ranges:  Prepandial:   less than 140 mg/dL      Peak postprandial:   less than 180 mg/dL (1-2 hours)      Critically ill patients:  140 - 180 mg/dL   Lab Results  Component Value Date   GLUCAP 308 (H) 12/28/2019   HGBA1C 10.9 (H) 12/24/2019    Review of Glycemic Control Results for AUBREI, BOUCHIE (MRN [id number]) as of 12/28/2019 11:19  Ref. Range 12/27/2019 15:59 12/27/2019 21:31 12/28/2019 07:40 12/28/2019 10:50  Glucose-Capillary Latest Ref Range: 70 - 99 mg/dL 97 983 (H) 382 (H) 505 (H)   Diabetes history: DM1(does not make insulin. Needs correction, basal and meal coverage)  Outpatient Diabetes medications: Lantus 25 units daily  Novolog SSI tid  Current orders for Inpatient glycemic control: Lantus 20 units daily Novolog 0-15 units tid  If to remain inpatient, consider adding Novolog 0-5 units QHS.  Also, noted patient missed AM correction due to "being unavailable," unclear of patient location during this time however, contributed to elevation of 308 mg/dL.   Thanks, Lujean Rave, MSN, RNC-OB Diabetes Coordinator [phone] (8a-5p)

## 2019-12-28 NOTE — TOC Progression Note (Addendum)
Transition of Care [facility]) - Progression Note    Patient Details  Name: Meagan Cummings MRN: [id number] Date of Birth: 01-Nov-1955  Transition [facility]) CM/SW Contact  Beckie Busing, RN Phone Number: [phone]  12/28/2019, 1:53 PM  Clinical Narrative:    Cherlyn Roberts currently still pending therefore unable to discharge patient at this time. Will have to wait until PASRR is approved. Patient had a PASRR interview via the telephone and results are pending. Will notify MD as soon as PASRR clears.        Expected Discharge Plan and Services           Expected Discharge Date: 12/28/19                                     Social Determinants of Health (SDOH) Interventions    Readmission Risk Interventions No flowsheet data found.

## 2019-12-28 NOTE — Progress Notes (Signed)
Subjective: Patient is examined at reclined chair. She reports feeling well without any complain. Patient will be transferred to a SNF today.  Objective:  Vital signs in last 24 hours: Vitals:   12/26/19 2228 12/27/19 0812 12/27/19 1700 12/27/19 2145  BP: (!) 150/89 130/89 (!) 141/88 138/72  Pulse: 75   83  Resp: 16 19 19    Temp: (!) 97.5 F (36.4 C) 98.7 F (37.1 C) 98.5 F (36.9 C) 98.9 F (37.2 C)  TempSrc: Oral Oral Oral Oral  SpO2: 98% 96% 98% 96%  Weight:      Height:        Physical Exam  Physical Exam Constitutional:      General: She is not in acute distress.    Appearance: Normal appearance. She is not ill-appearing.  HENT:     Head: Normocephalic and atraumatic.  Eyes:     General: No scleral icterus.    Extraocular Movements: Extraocular movements intact.  Pulmonary:     Effort: No respiratory distress.  Musculoskeletal:        General: Normal range of motion.     Cervical back: Normal range of motion.  Skin:    General: Skin is warm.  Neurological:     Mental Status: She is alert. Mental status is at baseline.     Comments: Mental status back to baseline.  Psychiatric:        Mood and Affect: Mood normal.        Behavior: Behavior normal.     Assessment/Plan: a64 yo AAF with PMH of Insulin dependent DM, COPD, hx of DKA, polysubstance use including alcohol and marijuana use, who was brought to the hospital via EMS for acute mental encephalopathy secondary to hypoglycemia, which has resolved. Patient is going to a SNF today.   Principal Problem:   Acute metabolic encephalopathy Active Problems:   Hypoglycemia   Uncontrolled diabetes mellitus (HCC)   Ketosis (HCC)   Acute metabolic encephalopathy  Acute Metabolic encephalopathy- resolved - Likely secondary tohypoglycemiaas a result ofpatient's poor compliance to insulin administration.DKA is ruled out: pH 7.518 on VBG, AG 8.  -B-hydroxybutyric acidimproved3.2  - 1.2 - 0.4 - Monitor BMP daily and CBG - Mental status back to baseline. PTrecommended SNF. There is a bed offer [facility] and patient has accepted. Plan to discharge today.   Insulin dependent DM - Per chart review, patient last saw a PCP in 12/2018 and was on Metformin 500 mg, insulin Aspart 7 units BID and Glargine 38 units daily.Patient reports that she saw her PCP 6 months ago and have a follow up appointment next week.Her endocrinology office visit in 2018 shows that patient has type 1 DM.  - Last A1C in 2016 was 13.3. New A1C10.9. - Patient is poorlycompliant with insulin administrationand has had similar episodes of hypoglycemia in the past. - Diabetic coordinator started a Freestyle Libre sensor, patient is pleased. - Continue 20 units Lantus daily and Novolog SSI TID. She will start 20 units of Glargine daily when discharged.  - Patient will needtofollow up with PCP forbettermanagement of herdiabetes. She will also need more diabetic teaching about how to give Insulin correctly.  Alcohol abuse - Unknown patient's last drink - CIWA without Ativan in place - ContinueThiamine and Folic acid - Discuss with patient about alcohol cessation. Patient states that she only drinks 1 beer 2 times a week. Advise patient to keep the alcohol amount as low as possible.   Poly substance abuse  -  UDS shows positive Cocaine and Marijuana - Patient states that she smokes Marijuana to help with lower extremity neuropathy. Advised patient to quit.   Diet:low carb diet IVF:N/A CODE: full DVT : lovenox 40 mg subq  Prior to Admission Living Arrangement:Home, living by herself Anticipated Discharge Location:SNF Barriers to Discharge:Discharge today Dispo: Anticipated discharge in approximately0day(s).  Doran Stabler, DO 12/28/2019, 7:05 AM Pager: [phone] After 5pm on weekdays and 1pm on weekends: On Call pager [phone]

## 2019-12-28 NOTE — Progress Notes (Signed)
Physical Therapy Treatment Patient Details Name: Meagan Cummings MRN: [id number] DOB: [birth year] Today's Date: 12/28/2019    History of Present Illness 64 yo female admitted to ED on 7/5 after being found down at home by son. Pt with medical workup for hypoglycemia, AME, weakness. PMH includes DM, PSA, prior hospitalizations for DKA and hypoglycemia.    PT Comments    Pt progressing well with mobility, transitioning to HHA during gait to challenge dynamic balance. Pt overall with less LOB this day, but unsteadiness still present and pt remains very high fall risk. Pt tolerated LE exercises well, pt with limited activity tolerance at this time but progressing. PT continuing to recommend SNF, pt anticipates d/c today.     Follow Up Recommendations  SNF;Supervision/Assistance - 24 hour     Equipment Recommendations  None recommended by PT    Recommendations for Other Services       Precautions / Restrictions Precautions Precautions: Fall Restrictions Weight Bearing Restrictions: No    Mobility  Bed Mobility Overal bed mobility: Needs Assistance Bed Mobility: Supine to Sit     Supine to sit: Min guard;HOB elevated     General bed mobility comments: for safety, increased time with use of bedrails and HOB elevation  Transfers Overall transfer level: Needs assistance Equipment used: Rolling walker (2 wheeled);1 person hand held assist Transfers: Sit to/from Stand Sit to Stand: Min guard;Min assist         General transfer comment: min assist initially for power up, transitioning to min guard for safety during repeated sit to stands for strengthening.  Ambulation/Gait Ambulation/Gait assistance: Min assist Gait Distance (Feet): 80 Feet Assistive device: Rolling walker (2 wheeled);1 person hand held assist Gait Pattern/deviations: Step-through pattern;Decreased stride length;Trunk flexed;Drifts right/left;Staggering right Gait velocity: decr   General Gait  Details: min assist to steady and guide pt trajectory, correcting unsteadiness especially with weaving of gait and near scissoring. Initially using RW, transitioned to HHA to challenge pt's dynamic balance.   Stairs             Wheelchair Mobility    Modified Rankin (Stroke Patients Only)       Balance Overall balance assessment: Needs assistance Sitting-balance support: No upper extremity supported Sitting balance-Leahy Scale: Good     Standing balance support: Bilateral upper extremity supported;During functional activity Standing balance-Leahy Scale: Poor Standing balance comment: reliant on PT and RW assist             High level balance activites: Head turns;Turns High Level Balance Comments: Horizontal and vertical head turning x25 ft in hallway, weaving and slowing of gait with challenges to dynamic standing balance            Cognition Arousal/Alertness: Awake/alert Behavior During Therapy: WFL for tasks assessed/performed Overall Cognitive Status: Impaired/Different from baseline Area of Impairment: Following commands;Safety/judgement;Problem solving                       Following Commands: Follows one step commands with increased time Safety/Judgement: Decreased awareness of safety;Decreased awareness of deficits   Problem Solving: Difficulty sequencing;Requires verbal cues;Requires tactile cues;Slow processing General Comments: Pt more alert and oriented this session, but still presents with increased processing time and requires multimodal safety cues during mobility.      Exercises General Exercises - Lower Extremity Long Arc Quad: AROM;Both;10 reps;Seated (against min PT resistance) Mini-Sqauts: AROM;Both;5 reps (emphasis on slow eccentric lowering and self-steady in standing)    General Comments  Pertinent Vitals/Pain Pain Assessment: No/denies pain    Home Living                      Prior Function             PT Goals (current goals can now be found in the care plan section) Acute Rehab PT Goals Patient Stated Goal: get stronger, think clearer PT Goal Formulation: With patient Time For Goal Achievement: 01/08/20 Potential to Achieve Goals: Good Progress towards PT goals: Progressing toward goals    Frequency    Min 2X/week      PT Plan      Co-evaluation              AM-PAC PT "6 Clicks" Mobility   Outcome Measure  Help needed turning from your back to your side while in a flat bed without using bedrails?: A Little Help needed moving from lying on your back to sitting on the side of a flat bed without using bedrails?: A Little Help needed moving to and from a bed to a chair (including a wheelchair)?: A Little Help needed standing up from a chair using your arms (e.g., wheelchair or bedside chair)?: A Little Help needed to walk in hospital room?: A Little Help needed climbing 3-5 steps with a railing? : A Lot 6 Click Score: 17    End of Session Equipment Utilized During Treatment: Gait belt Activity Tolerance: Patient limited by fatigue;Patient tolerated treatment well Patient left: in chair;with chair alarm set;with call bell/phone within reach Nurse Communication: Other (comment);Mobility status (IV occluded) PT Visit Diagnosis: Muscle weakness (generalized) (M62.81);Difficulty in walking, not elsewhere classified (R26.2);Repeated falls (R29.6)     Time: 6333-5456 PT Time Calculation (min) (ACUTE ONLY): 17 min  Charges:  $Neuromuscular Re-education: 8-22 mins                     Floyd Wade E, PT Acute Rehabilitation Services Pager [phone]  Office [phone]    Arthella Headings D Jalynne Persico 12/28/2019, 10:52 AM

## 2019-12-29 LAB — GLUCOSE, CAPILLARY
Glucose-Capillary: 239 mg/dL — ABNORMAL HIGH (ref 70–99)
Glucose-Capillary: 342 mg/dL — ABNORMAL HIGH (ref 70–99)
Glucose-Capillary: 320 mg/dL — ABNORMAL HIGH (ref 70–99)
Glucose-Capillary: 69 mg/dL — ABNORMAL LOW (ref 70–99)
Glucose-Capillary: 304 mg/dL — ABNORMAL HIGH (ref 70–99)

## 2019-12-29 LAB — BASIC METABOLIC PANEL
Anion gap: 7 (ref 5–15)
BUN: 9 mg/dL (ref 8–23)
CO2: 28 mmol/L (ref 22–32)
Calcium: 8.7 mg/dL — ABNORMAL LOW (ref 8.9–10.3)
Chloride: 104 mmol/L (ref 98–111)
Creatinine, Ser: 0.64 mg/dL (ref 0.44–1.00)
GFR calc Af Amer: >60 mL/min (ref >60)
GFR calc non Af Amer: >60 mL/min (ref >60)
Glucose, Bld: 296 mg/dL — ABNORMAL HIGH (ref 70–99)
Potassium: 4.1 mmol/L (ref 3.5–5.1)
Sodium: 139 mmol/L (ref 135–145)

## 2019-12-29 LAB — CULTURE, BLOOD (ROUTINE X 2)
Culture: NO GROWTH
Culture: NO GROWTH

## 2019-12-29 NOTE — Social Work (Signed)
Pts passr is still pending. Pt will not be able to d/c to SNF with passr number.   Jimmy Picket, Theresia Majors, [place] Clinical Social Worker [phone]

## 2019-12-29 NOTE — Progress Notes (Addendum)
Subjective: Patient is examined at bedside today. She is doing well and has no complaint. Waiting for SNF placement.   Objective:  Vital signs in last 24 hours: Vitals:   12/28/19 0731 12/28/19 1641 12/28/19 2006 12/29/19 0816  BP: 132/87 135/81 122/74 129/89  Pulse: 64 80 85 74  Resp: 16 16 18 18   Temp: 98.1 F (36.7 C) 98.3 F (36.8 C) 97.6 F (36.4 C) 98.3 F (36.8 C)  TempSrc: Oral Oral    SpO2: 96% 97% 96% 100%  Weight:      Height:        Physical Exam  Physical Exam Constitutional:      General: She is not in acute distress. HENT:     Head: Normocephalic.  Eyes:     General: No scleral icterus. Cardiovascular:     Rate and Rhythm: Normal rate and regular rhythm.     Heart sounds: Normal heart sounds.  Pulmonary:     Effort: Pulmonary effort is normal. No respiratory distress.  Abdominal:     General: Abdomen is flat. Bowel sounds are normal.     Tenderness: There is no abdominal tenderness.  Musculoskeletal:        General: Normal range of motion.     Cervical back: Normal range of motion.     Right lower leg: No edema.     Left lower leg: No edema.  Neurological:     Mental Status: She is alert.  Psychiatric:        Mood and Affect: Mood normal.     Assessment/Plan: Meagan Cummings is a 64 yo AAF with PMH of Insulin dependent DM, COPD, hx of DKA, polysubstance use including alcohol and marijuana use, who was brought to the hospital via EMS for acute mental encephalopathy secondary to hypoglycemia, which has resolved. Patient is waiting for SNF placement.   Principal Problem:   Acute metabolic encephalopathy Active Problems:   Hypoglycemia   Uncontrolled diabetes mellitus (HCC)   Ketosis (HCC)   Acute metabolic encephalopathy  Acute Metabolic encephalopathy- resolved - Likely secondary to hypoglycemia as a result of patient's poor compliance to insulin administration.  - Monitor BMP daily and CBG - Mental status back to baseline. PT  recommended SNF. There is a bed offer from [facility] and patient has accepted. Waiting for PASSR approval.    Insulin dependent DM - Per chart review, patient last saw a PCP in 12/2018 and was on Metformin 500 mg, insulin Aspart 7 units BID and Glargine 38 units daily. Patient reports that she saw her PCP 6 months ago and have a follow up appointment next week. Her endocrinology office visit in 2018 shows that patient has type 1 DM.  - Last A1C in 2016 was 13.3. New A1C 10.9.  - Patient is poorly compliant with insulin administration and has had similar episodes of hypoglycemia in the past.  - Diabetic coordinator started a Freestyle Libre sensor, patient is pleased. - Continue 20 units Lantus daily and Novolog SSI TID. She will start 20 units of Glargine daily when discharged.  - Patient will need to follow up with PCP for better management of her diabetes. She will also need more diabetic teaching about how to give Insulin correctly.   Alcohol abuse - Unknown patient's last drink - CIWA without Ativan in place - ContinueThiamine and Folic acid - Discuss with patient about alcohol cessation. Patient states that she only drinks 1 beer 2 times a week. Advise patient to keep the  alcohol amount as low as possible.    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 Advised patient to quit.    Diet: low carb diet IVF: N/A CODE: full DVT : lovenox 40 mg subq   Prior to Admission Living Arrangement: Home, living by herself Anticipated Discharge Location: SNF Barriers to Discharge: SNF placement Dispo: Anticipated discharge in approximately 1 day(s).  Doran Stabler, DO 12/29/2019, 12:12 PM Pager: [phone] After 5pm on weekdays and 1pm on weekends: On Call pager [phone]

## 2019-12-30 LAB — GLUCOSE, CAPILLARY
Glucose-Capillary: 362 mg/dL — ABNORMAL HIGH (ref 70–99)
Glucose-Capillary: 358 mg/dL — ABNORMAL HIGH (ref 70–99)
Glucose-Capillary: 203 mg/dL — ABNORMAL HIGH (ref 70–99)
Glucose-Capillary: 68 mg/dL — ABNORMAL LOW (ref 70–99)
Glucose-Capillary: 143 mg/dL — ABNORMAL HIGH (ref 70–99)
Glucose-Capillary: 324 mg/dL — ABNORMAL HIGH (ref 70–99)

## 2019-12-30 MED ORDER — INSULIN GLARGINE 100 UNIT/ML ~~LOC~~ SOLN
22.0000 [IU] | Freq: Every day | SUBCUTANEOUS | Status: DC
  Administered 2019-12-30 – 2019-12-31 (×2): 22 [IU] via SUBCUTANEOUS
  Filled 2019-12-30 (×2): qty 0.22

## 2019-12-30 NOTE — Progress Notes (Signed)
   Subjective: HD#5   Overnight: No acute events reported  Today, Meagan Cummings states she is doing very well and is grateful for the care that she is received so far in the hospital.  She is tolerating her breakfast and denies any symptoms whatsoever.  Objective:  Vital signs in last 24 hours: Vitals:   12/28/19 2006 12/29/19 0816 12/29/19 1642 12/29/19 2141  BP: 122/74 129/89 (!) 139/126 (!) 136/95  Pulse: 85 74 83 78  Resp: 18 18 19 14   Temp: 97.6 F (36.4 C) 98.3 F (36.8 C) 98.3 F (36.8 C) 98.8 F (37.1 C)  TempSrc:      SpO2: 96% 100% 99% 97%  Weight:      Height:       Const: In no apparent distress, sitting in bedside recliner Resp: CTA BL, no wheezes, crackles, rhonchi CV: RRR, no murmurs, gallop, rub Skin: Continuous blood glucose monitoring sensor located at the left upper extremity.    Assessment/Plan:  Principal Problem:   Acute metabolic encephalopathy Active Problems:   Hypoglycemia   Uncontrolled diabetes mellitus (HCC)   Ketosis (HCC)   Acute metabolic encephalopathy  a64 yo AAF with PMH of Insulin dependent DM, COPD, hx of DKA, polysubstance use including alcohol and marijuana use, who was brought to the hospital via EMS foracute mental encephalopathy secondary tohypoglycemia, which has resolved. Patientis waiting for SNF placement.    Acute Metabolic encephalopathy- resolved - Likely secondary tohypoglycemiaas a result ofpatient's poor compliance to insulin administration. - Monitor BMP daily and CBG -Mental status back to baseline.PTrecommended SNF.There is a bed offer from [facility] and patient has accepted. Waiting for PASSR approval.    Insulin dependent DM - Per chart review, patient last saw a PCP in 12/2018 and was on Metformin 500 mg, insulin Aspart 7 units BID and Glargine 38 units daily.Patient reports that she saw her PCP 6 months ago and have a follow up appointment next week.Her  endocrinology office visit in 2018 shows that patient has type 1 DM.  - Last A1C in 2016 was 13.3. New A1C10.9. - Patient is poorlycompliant with insulin administrationand has had similar episodes of hypoglycemia in the past. - Diabetic coordinator started a Freestyle Libre sensor, patient is pleased. - Increase Lantus to 22 units and Novolog SSI TID.  Her CBGs have been labile with ranges between 60s-340s and will continue to monitor closely. - Patient will needtofollow up with PCP forbettermanagement of herdiabetes.She will also need more diabetic teaching about how to give Insulin correctly.   Alcohol abuse - Unknown patient's last drink - CIWA without Ativan in place -ContinueThiamine and Folic acid -Discuss with patient about alcohol cessation. Patient states that she only drinks 1 beer 2 times a week. Advise patient to keep the alcohol amount as low as possible.   Poly substance abuse  - UDS shows positive Cocaine and Marijuana -Patient states that she smokes Marijuana to help with lower extremity neuropathy. Advised patient to quit.   Diet:low carb diet IVF:N/A CODE: full DVT : lovenox 40 mg subq  Prior to Admission Living Arrangement:Home, living by herself Anticipated Discharge Location:SNF Barriers to Discharge:SNF placement Dispo: Anticipated discharge in approximately1day(s).   2017, MD 12/30/2019, 6:21 AM Pager: [phone] Internal Medicine Teaching Service After 5pm on weekdays and 1pm on weekends: On Call pager: [phone]

## 2019-12-31 ENCOUNTER — Other Ambulatory Visit: Payer: Self-pay

## 2019-12-31 ENCOUNTER — Encounter (HOSPITAL_COMMUNITY): Payer: Self-pay | Admitting: Internal Medicine

## 2019-12-31 LAB — GLUCOSE, CAPILLARY
Glucose-Capillary: 326 mg/dL — ABNORMAL HIGH (ref 70–99)
Glucose-Capillary: 228 mg/dL — ABNORMAL HIGH (ref 70–99)
Glucose-Capillary: 363 mg/dL — ABNORMAL HIGH (ref 70–99)
Glucose-Capillary: 184 mg/dL — ABNORMAL HIGH (ref 70–99)

## 2019-12-31 MED ORDER — INSULIN ASPART 100 UNIT/ML ~~LOC~~ SOLN: 0.0000 [IU] | Freq: Every day | SUBCUTANEOUS | Status: DC

## 2019-12-31 MED ORDER — INSULIN ASPART 100 UNIT/ML ~~LOC~~ SOLN
0.0000 [IU] | Freq: Three times a day (TID) | SUBCUTANEOUS | Status: DC
  Administered 2019-12-31: 9 [IU] via SUBCUTANEOUS
  Administered 2020-01-01: 7 [IU] via SUBCUTANEOUS
  Administered 2020-01-01: 9 [IU] via SUBCUTANEOUS
  Administered 2020-01-01: 2 [IU] via SUBCUTANEOUS

## 2019-12-31 MED ORDER — INSULIN GLARGINE 100 UNIT/ML ~~LOC~~ SOLN
25.0000 [IU] | Freq: Every day | SUBCUTANEOUS | Status: DC
  Administered 2020-01-01: 25 [IU] via SUBCUTANEOUS
  Filled 2019-12-31: qty 0.25

## 2019-12-31 MED ORDER — INSULIN ASPART 100 UNIT/ML ~~LOC~~ SOLN
0.0000 [IU] | Freq: Three times a day (TID) | SUBCUTANEOUS | Status: DC
  Administered 2019-12-31: 11 [IU] via SUBCUTANEOUS
  Administered 2019-12-31: 5 [IU] via SUBCUTANEOUS

## 2019-12-31 NOTE — TOC Progression Note (Signed)
Transition of Care [facility]) - Progression Note    Patient Details  Name: Kennedey Digilio MRN: [id number] Date of Birth: 1955-06-23  Transition of [facility]) CM/SW Contact  Beckie Busing, RN Phone Number: [phone]  12/31/2019, 1:21 PM  Clinical Narrative:    Aura Fey is currently still pending. Unable to proceed with discharge.         Expected Discharge Plan and Services           Expected Discharge Date: 12/28/19                                     Social Determinants of Health (SDOH) Interventions    Readmission Risk Interventions No flowsheet data found.

## 2019-12-31 NOTE — Progress Notes (Addendum)
Subjective:   Hospital day: 6  Overnight event: No  Patient is seen at reclined chair during examination. She reports feeling well. She states that her blood glucose fluctuates per [employer]. Patient is still waiting for SNF placement  Objective:  Vital signs in last 24 hours: Vitals:   12/29/19 2141 12/30/19 0820 12/30/19 1801 12/30/19 2101  BP: (!) 136/95 102/65 112/74 115/78  Pulse: 78 76 84 81  Resp: 14 19 20 18   Temp: 98.8 F (37.1 C) 98.4 F (36.9 C) 98.9 F (37.2 C) 98.2 F (36.8 C)  TempSrc:  Oral  Oral  SpO2: 97% 100% 100% 99%  Weight:      Height:        Physical Exam  Physical Exam Constitutional:      General: She is not in acute distress.    Appearance: Normal appearance. She is not toxic-appearing.  HENT:     Head: Normocephalic.  Eyes:     General: No scleral icterus.    Conjunctiva/sclera: Conjunctivae normal.  Cardiovascular:     Rate and Rhythm: Normal rate and regular rhythm.     Heart sounds: Normal heart sounds.  Pulmonary:     Effort: No respiratory distress.     Comments: Very mild crackles noted at bilateral lung base Abdominal:     Palpations: Abdomen is soft.  Musculoskeletal:        General: Normal range of motion.     Cervical back: Normal range of motion.     Right lower leg: No edema.     Left lower leg: No edema.  Skin:    General: Skin is warm.     Coloration: Skin is not jaundiced.  Neurological:     Mental Status: She is alert.  Psychiatric:        Mood and Affect: Mood normal.     Assessment/Plan: Azelea Seguin is a 64 yo AAF with PMH of Insulin dependent DM, COPD, hx of DKA, polysubstance use including alcohol and marijuana use, who was brought to the hospital via EMS for acute mental encephalopathy secondary to hypoglycemia, which has resolved. Patient is waiting for SNF placement.     Principal Problem:   Acute metabolic encephalopathy Active Problems:   Hypoglycemia   Uncontrolled diabetes  mellitus (HCC)   Ketosis (HCC)   Acute metabolic encephalopathy  Acute Metabolic encephalopathy- resolved  Likely secondary to hypoglycemia as a result of patient's poor compliance to insulin administration.   - Monitor BMP daily and CBG - Mental status back to baseline. PT recommended SNF. There is a bed offer from [facility] and patient has accepted. Waiting for PASSR approval.      Insulin dependent DM Per chart review, patient last saw a PCP in 12/2018 and was on Metformin 500 mg, insulin Aspart 7 units BID and Glargine 38 units daily. Patient reports that she saw her PCP 6 months ago and have a follow up appointment next week. Her endocrinology office visit in 2018 shows that patient has type 1 DM. Last A1C in 2016 was 13.3. New A1C 10.9. Patient is poorly compliant with insulin administration and has had similar episodes of hypoglycemia in the past.   - Diabetic coordinator started a Freestyle Libre sensor, patient is pleased. - Her CBGs have been labile with ranges between 60s-340s and mostly in the 60s after lunch. She reports eating normally at lunch. Will increase Lantus to 25 units and decrease to sensitive Novolog SSI as sugars seem to drop when  she starts receiving SSI during the day - Continue to monitor for CBG   - Patient will need to follow up with PCP for better management of her diabetes. She will also need more diabetic teaching about how to give Insulin correctly.     Alcohol use - ContinueThiamine and Folic acid - Discuss with patient about alcohol cessation. Patient states that she only drinks 1 beer 2 times a week. Advise patient to keep the alcohol amount as low as possible.      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 Advised patient to quit.      Diet: low carb diet IVF: N/A CODE: full DVT : lovenox 40 mg subq   Prior to Admission Living  Arrangement: Home, living by herself Anticipated Discharge Location: SNF Barriers to Discharge: SNF placement Dispo: Anticipated discharge in approximately 1 day(s).  Doran Stabler, DO 12/31/2019, 6:21 AM Pager: [phone] After 5pm on weekdays and 1pm on weekends: On Call pager [phone]

## 2019-12-31 NOTE — TOC Progression Note (Signed)
Transition of Care [facility]) - Progression Note    Patient Details  Name: Meagan Cummings MRN: [id number] Date of Birth: 08/20/1955  Transition of Care [facility]) CM/SW Contact  Beckie Busing, RN Phone Number: [phone] 12/31/2019, 4:03 PM  Clinical Narrative:  Patients mother Alphia Kava called to state that she spoke with "Dr. Estevan Ryder" who told her to call the case manager because he was not able to give her information about the patients discharge plan. CM attempted to explain the process of placement but mother rambles on in conversation stating that she doesn't understand the whole process. Mother rambles on in conversation in excess of 30 minutes. Mother keeps stating that she is confused about the whole situation. Mother states that herself and the patient son both have HCPOA. CM has asked mother to bring papers in to be placed in the patients charts. Mother states that she has looked up this information and the state may make her a ward of the state. Cm is unable to follow conversation with mother because mother rambles. Mother states that she will bring HCPOA  forms to the hospital.         Expected Discharge Plan and Services           Expected Discharge Date: 12/28/19                                     Social Determinants of Health (SDOH) Interventions    Readmission Risk Interventions No flowsheet data found.

## 2020-01-01 LAB — GLUCOSE, CAPILLARY
Glucose-Capillary: 352 mg/dL — ABNORMAL HIGH (ref 70–99)
Glucose-Capillary: 178 mg/dL — ABNORMAL HIGH (ref 70–99)
Glucose-Capillary: 331 mg/dL — ABNORMAL HIGH (ref 70–99)

## 2020-01-01 LAB — SARS CORONAVIRUS 2 BY RT PCR (HOSPITAL ORDER, PERFORMED IN ~~LOC~~ HOSPITAL LAB): SARS Coronavirus 2: NEGATIVE

## 2020-01-01 MED ORDER — NOVOLOG FLEXPEN 100 UNIT/ML ~~LOC~~ SOPN: PEN_INJECTOR | SUBCUTANEOUS | 11 refills | Status: DC

## 2020-01-01 MED ORDER — BASAGLAR KWIKPEN 100 UNIT/ML ~~LOC~~ SOPN: 25.0000 [IU] | PEN_INJECTOR | Freq: Every day | SUBCUTANEOUS | 2 refills | Status: DC

## 2020-01-01 NOTE — Progress Notes (Signed)
Subjective:   Hospital day:7  Overnight event:none  Patient is seen at bedside today. She states that she is doing well and has no complains. She states that her appetite is good but she does not have many BM.   Objective:  Vital signs in last 24 hours: Vitals:   12/31/19 0751 12/31/19 1656 12/31/19 2259 01/01/20 0744  BP: 123/77 121/83 107/62 107/75  Pulse: 80 100 89 80  Resp: 20 20    Temp: 98.7 F (37.1 C) 97.9 F (36.6 C) 98.5 F (36.9 C) 98 F (36.7 C)  TempSrc:      SpO2: 100% 97% 98% 95%  Weight:      Height:        Physical Exam  Physical Exam Constitutional:      General: She is not in acute distress.    Appearance: Normal appearance.  HENT:     Head: Normocephalic.  Eyes:     General: No scleral icterus.    Conjunctiva/sclera: Conjunctivae normal.  Cardiovascular:     Rate and Rhythm: Normal rate and regular rhythm.     Heart sounds: Normal heart sounds. No murmur heard.   Pulmonary:     Effort: Pulmonary effort is normal. No respiratory distress.     Comments: Mild crackle noted at bilateral lung bases Abdominal:     Palpations: Abdomen is soft.     Tenderness: There is no abdominal tenderness.  Musculoskeletal:        General: Normal range of motion.     Cervical back: Normal range of motion.     Right lower leg: No edema.     Left lower leg: No edema.  Skin:    General: Skin is warm.     Coloration: Skin is not jaundiced.  Neurological:     Mental Status: She is alert.  Psychiatric:        Mood and Affect: Mood normal.     Assessment/Plan: Meagan Cummings a64 yo AAF with PMH of Insulin dependent DM, COPD, hx of DKA, polysubstance use including alcohol and marijuana use, who was brought to the hospital via EMS foracute mental encephalopathy secondary tohypoglycemia, which has resolved. Patientiswaiting forSNFplacement.  Principal Problem:   Acute metabolic encephalopathy Active Problems:   Hypoglycemia    Uncontrolled diabetes mellitus (HCC)   Ketosis (HCC)   Acute metabolic encephalopathy  Acute Metabolic encephalopathy- resolved  Likely secondary tohypoglycemiaas a result ofpatient's poor compliance to insulin administration.  - Monitor BMP daily and CBG -Mental status back to baseline.PTrecommended SNF.There is a bed offer from [facility] and patient has accepted.Waiting for PASSR approval. - Encourage patient to use the spirometer and ambulation.    Insulin dependent DM Per chart review, patient last saw a PCP in 12/2018 and was on Metformin 500 mg, insulin Aspart 7 units BID and Glargine 38 units daily.Patient reports that she saw her PCP 6 months ago and have a follow up appointment next week.Her endocrinology office visit in 2018 shows that patient has type 1 DM. Last A1C in 2016 was 13.3. New A1C10.9.Patient is poorlycompliant with insulin administrationand has had similar episodes of hypoglycemia in the past.  - Diabetic coordinator started a Freestyle Libre sensor, patient is pleased. - Her CBGs become more stable 178-300s. Continue increase Lantus to 25unitsand sensitive SSI - Continue to monitor for CBG  - Patient will needtofollow up with PCP forbettermanagement of herdiabetes.She will also need more diabetic teaching about how to give Insulin correctly.   Alcohol use -ContinueThiamine  and Folic acid -Discuss with patient about alcohol cessation. Patient states that she only drinks 1 beer 2 times a week. Advise patient to keep the alcohol amount as low as possible.   Poly substance abuse  - UDS shows positive Cocaine and Marijuana -Patient states that she smokes Marijuana to help with lower extremity neuropathy. Advised patient to quit.   Diet:low carb diet IVF:N/A CODE: full DVT : lovenox 40 mg subq  Prior to Admission Living Arrangement:Home, living by herself Anticipated Discharge Location:SNF Barriers to  Discharge:SNF placement Dispo: Anticipated discharge in approximately0day(s).  Doran Stabler, DO 01/01/2020, 2:48 PM Pager: [phone] After 5pm on weekdays and 1pm on weekends: On Call pager [phone]

## 2020-01-01 NOTE — Discharge Summary (Signed)
Name: Meagan Cummings MRN: [id number] DOB: [birth year] 64 y.o. PCP: Patient, No Pcp Per  Date of Admission: 12/24/2019 10:27 AM Date of Discharge: 01/01/20 Attending Physician: Meagan Shutter, MD  Discharge Diagnosis: 1. Acute metabolic encephalopathy 2. Hypoglycemia 3. Uncontrolled diabetes mellitus 4. Ketosis   Discharge Medications: Allergies as of 01/01/2020      Reactions   Other Rash   Pt gets rash on face and chest from cat and dog hair      Medication List    TAKE these medications   Accu-Chek Aviva Plus test strip Generic drug: glucose blood 1 each 4 (four) times daily.   Basaglar KwikPen 100 UNIT/ML Inject 0.25 mLs (25 Units total) into the skin daily.   DULoxetine 60 MG capsule Commonly known as: CYMBALTA Take 60 mg by mouth daily.   fluticasone 50 MCG/ACT nasal spray Commonly known as: FLONASE Place 1 spray into both nostrils daily.   gabapentin 400 MG capsule Commonly known as: NEURONTIN Take 800 mg by mouth 3 (three) times daily.   NovoLOG FlexPen 100 UNIT/ML FlexPen Generic drug: insulin aspart Before each meal 3 times a day, 140-199 - 1 units, 200-250 - 3 units, 251-299 - 5 units,  300-349 - 6 units,  350 or above 8 units. Insulin PEN if approved, provide syringes and needles if needed. What changed: additional instructions   QUEtiapine 100 MG tablet Commonly known as: SEROQUEL Take 100 mg by mouth at bedtime.   tiotropium 18 MCG inhalation capsule Commonly known as: SPIRIVA Place 18 mcg into inhaler and inhale daily.       Disposition and follow-up:   Ms.Meagan Cummings was discharged from [facility] in Stable condition.  At the hospital follow up visit please address:  1.  1. Acute metabolic encephalopathy      - Secondary to hypoglycemia. Please follow up with PCP and endocrinology for education and tighter control of diabetes.       2. Uncontrolled diabetes      - Continue using the [employer]. Follow up with PCP and endocrinology for diabetes management. Please follow instruction for Insulin use. Please administer 25 units of Glargine daily in addition to your novolog sliding scale insulin.       3. Polysubstance use      - Follow up on alcohol and drug use, advised cutting back on alcohol and cessation of cocaine and marijuana  2.  Labs / imaging needed at time of follow-up: NA  3.  Pending labs/ test needing follow-up: NA  Follow-up Appointments:  Contact information for after-discharge care    Destination    [facility] .   Service: Skilled Nursing [address] information: [address] [place] [address] [phone]                  Hospital Course by problem list: 1. #Acute metabolic encephalopathy secondary to hypoglycemia and ketosis: Ms. Meagan Cummings is a 64 year old woman with medical history significant for insulin-dependent diabetes mellitus, COPD, polysubstance use disorder including alcohol and marijuana use who presented to the emergency department with hypoglycemia and altered mental status.  On subsequent days and further clarification, she reports that she blacked out after administering 25 units of her long-acting insulin. Hypoglycemia resolved prior to arrival and did not recur.   She has a history of hypoglycemic episodes, recommended consistency with meals and insulin and avoidance of drugs and alcohol that may be complicating her ability to care for herself. She  was evaluated by physical therapy who recommended discharge to a skilled nursing facility and was discharged on 01/01/20.    #Insulin-dependent diabetes mellitus: Per chart review, her diabetes regimen prior to arrival were Metformin 500 mg daily, NovoLog sliding scale and glargine 38 units daily.  Her hemoglobin A1c during this admission was found to be at 10.9%. She was started on 10 units of Lantus and was increased to 25 units daily with sliding scale  Insulin. Freestyle Meagan Cummings was started to help with glucose monitoring. She should follow up with her PCP and her endocrinologist for further management of her poorly controlled diabetes with ongoing risk of both high and low glucose.     Discharge Vitals:   BP 107/75   Pulse 80   Temp 98 F (36.7 C)   Resp 20   Ht 5\' 2"  (1.575 m)   Wt 54.3 kg   SpO2 95%   BMI 21.90 kg/m   Pertinent Labs, Studies, and Procedures:  CBC Latest Ref Rng & Units 12/28/2019 12/26/2019 12/25/2019  WBC 4.0 - 10.5 K/uL 4.4 6.0 7.9  Hemoglobin 12.0 - 15.0 g/dL 10.8(L) 11.6(L) 11.8(L)  Hematocrit 36 - 46 % 34.3(L) 36.7 36.3  Platelets 150 - 400 K/uL 221 222 231   BMP Latest Ref Rng & Units 12/29/2019 12/28/2019 12/27/2019  Glucose 70 - 99 mg/dL 02/27/2020) 329(J) 242(A)  BUN 8 - 23 mg/dL 9 9 7(L)  Creatinine 834(H - 1.00 mg/dL 9.62 2.29 7.98  Sodium 135 - 145 mmol/L 139 138 142  Potassium 3.5 - 5.1 mmol/L 4.1 3.5 3.8  Chloride 98 - 111 mmol/L 104 104 108  CO2 22 - 32 mmol/L 28 27 26   Calcium 8.9 - 10.3 mg/dL 9.21) ) 1.9(E)   Blood Culture    Component Value Date/Time   SDES BLOOD RIGHT HAND 12/24/2019 2024   SPECREQUEST  12/24/2019 2024    BOTTLES DRAWN AEROBIC ONLY Blood Culture results may not be optimal due to an inadequate volume of blood received in culture bottles   CULT  12/24/2019 2024    NO GROWTH 5 DAYS Performed at [facility], [address], [place], [address] [place]    REPTSTATUS 12/29/2019 FINAL 12/24/2019 2024    Discharge Instructions: Discharge Instructions    Call MD for:  persistant dizziness or light-headedness   Complete by: As directed    Diet - low sodium heart healthy   Complete by: As directed    Diet - low sodium heart healthy   Complete by: As directed    Diet Carb Modified   Complete by: As directed    Discharge instructions   Complete by: As directed    Thank you for allowing 02/24/2020 to take care of you during this admission.  Please follow up with your PCP after  discharge to management your diabetes. Please give 25 units of Insulin Glargine daily. Continue short acting Insulin as instructions.   Increase activity slowly   Complete by: As directed    Increase activity slowly   Complete by: As directed       Signed: 2025, MD 01/01/2020, 10:46 AM   Pager: [phone]

## 2020-01-01 NOTE — TOC Transition Note (Signed)
Transition of Care [facility]) - CM/SW Discharge Note   Patient Details  Name: Valory Wetherby MRN: [id number] Date of Birth: [birth year]  [facility]) CM/SW Contact:  Beckie Busing, RN Phone Number: [phone]  01/01/2020, 3:10 PM   Clinical Narrative:   Patient to discharge to [facility]. Transportation set up with [facility]. Patient has been updated. Attempted to call mother at [phone] but reached a recording stating that at the subscribers request the phone is not accepting incoming calls. Bedside nurse has been made aware of room assignment. Please call report to [phone] Rm # [id number].  No further needs noted at this time. CM will sign off.    Final next level of care: Skilled Nursing Facility Barriers to Discharge: No Barriers Identified   Patient Goals and CMS Choice Patient states their goals for this hospitalization and ongoing recovery are:: To get out of the hospital and get better CMS Medicare.gov Compare Post Acute Care list provided to:: Patient Choice offered to / list presented to : Patient  Discharge Placement                  Name of family member notified: No family avaliable attempted to call mom reached recording not accepting incoming calls    Discharge Plan and Services                DME Arranged: N/A DME Agency: NA       HH Arranged: NA HH Agency: NA        Social Determinants of Health (SDOH) Interventions     Readmission Risk Interventions No flowsheet data found.

## 2020-01-01 NOTE — Progress Notes (Signed)
Physical Therapy Treatment Patient Details Name: Meagan Cummings MRN: [id number] DOB: 1955/08/17 Today's Date: 01/01/2020    History of Present Illness 64 yo female admitted to ED on 7/5 after being found down at home by son. Pt with medical workup for hypoglycemia, AME, weakness. PMH includes DM, PSA, prior hospitalizations for DKA and hypoglycemia.    PT Comments    Patient received in recliner, pleasant and willing to participate in session today. See below for mobility levels. Continues to require MinA for gait due to ongoing issues with gross unsteadiness and poor safety awareness, also reduced protective reactions and very little awareness of gait impairments leading to loss of balance such as excessive supination or tripping when she does not clear her feet enough. Able to progress gait distance significantly today. Left sitting up in chair with all needs met this afternoon awaiting DC.     Follow Up Recommendations  SNF;Supervision/Assistance - 24 hour     Equipment Recommendations  None recommended by PT    Recommendations for Other Services       Precautions / Restrictions Precautions Precautions: Fall Restrictions Weight Bearing Restrictions: No    Mobility  Bed Mobility               General bed mobility comments: OOB in chair  Transfers Overall transfer level: Needs assistance Equipment used: 1 person hand held assist Transfers: Sit to/from Stand Sit to Stand: Min guard         General transfer comment: min guard to power up and steady prior to gait, VC to prevent her from trying to ambulate before gaining balance  Ambulation/Gait Ambulation/Gait assistance: Min assist Gait Distance (Feet): 140 Feet Assistive device: 1 person hand held assist Gait Pattern/deviations: Step-through pattern;Decreased stride length;Drifts right/left;Staggering right;Narrow base of support Gait velocity: decr   General Gait Details: MinA to steady due to narrow  BOS as well as tendency to excessivly supinate and walk on the outsides of her feet, also to maintain balance due to her often tripping over her toes intermittently   Stairs             Wheelchair Mobility    Modified Rankin (Stroke Patients Only)       Balance Overall balance assessment: Needs assistance Sitting-balance support: No upper extremity supported;Feet supported Sitting balance-Leahy Scale: Good     Standing balance support: No upper extremity supported;During functional activity Standing balance-Leahy Scale: Poor Standing balance comment: MinA to maintain balance                            Cognition Arousal/Alertness: Awake/alert Behavior During Therapy: WFL for tasks assessed/performed Overall Cognitive Status: Impaired/Different from baseline Area of Impairment: Following commands;Safety/judgement;Problem solving                 Orientation Level: Disoriented to;Situation   Memory: Decreased short-term memory Following Commands: Follows one step commands with increased time Safety/Judgement: Decreased awareness of safety;Decreased awareness of deficits   Problem Solving: Difficulty sequencing;Requires verbal cues;Requires tactile cues;Slow processing General Comments: A&O even more improved but patient still with increased processing time and in need of Mod cues for safety with mobility      Exercises      General Comments        Pertinent Vitals/Pain Pain Assessment: No/denies pain    Home Living  Prior Function            PT Goals (current goals can now be found in the care plan section) Acute Rehab PT Goals Patient Stated Goal: get stronger, think clearer PT Goal Formulation: With patient Time For Goal Achievement: 01/08/20 Potential to Achieve Goals: Good Progress towards PT goals: Progressing toward goals    Frequency    Min 2X/week      PT Plan Current plan remains  appropriate    Co-evaluation              AM-PAC PT "6 Clicks" Mobility   Outcome Measure  Help needed turning from your back to your side while in a flat bed without using bedrails?: A Little Help needed moving from lying on your back to sitting on the side of a flat bed without using bedrails?: A Little Help needed moving to and from a bed to a chair (including a wheelchair)?: A Little Help needed standing up from a chair using your arms (e.g., wheelchair or bedside chair)?: A Little Help needed to walk in hospital room?: A Little Help needed climbing 3-5 steps with a railing? : A Lot 6 Click Score: 17    End of Session Equipment Utilized During Treatment: Gait belt Activity Tolerance: Patient tolerated treatment well Patient left: in chair;with call bell/phone within reach Nurse Communication: Mobility status PT Visit Diagnosis: Muscle weakness (generalized) (M62.81);Difficulty in walking, not elsewhere classified (R26.2);Repeated falls (R29.6)     Time: 1610-9604 PT Time Calculation (min) (ACUTE ONLY): 12 min  Charges:  $Gait Training: 8-22 mins                     Windell Norfolk, DPT, PN1   Supplemental Physical Therapist [place]    Pager [phone] Acute Rehab Office [phone]

## 2020-01-01 NOTE — Progress Notes (Signed)
[facility] to [facility].

## 2020-01-01 NOTE — Plan of Care (Signed)

## 2020-04-28 ENCOUNTER — Other Ambulatory Visit: Payer: Self-pay

## 2020-04-28 ENCOUNTER — Encounter (HOSPITAL_COMMUNITY): Payer: Self-pay | Admitting: Emergency Medicine

## 2020-04-28 ENCOUNTER — Emergency Department (HOSPITAL_COMMUNITY): Payer: Medicare Other

## 2020-04-28 ENCOUNTER — Inpatient Hospital Stay (HOSPITAL_COMMUNITY)
Admission: EM | Admit: 2020-04-28 | Discharge: 2020-05-03 | DRG: 637 | Disposition: A | Discharge status: Home / Self Care | Payer: Medicare Other | Attending: Internal Medicine | Admitting: Internal Medicine

## 2020-04-28 DIAGNOSIS — Z20822 Contact with and (suspected) exposure to covid-19: Secondary | ICD-10-CM | POA: Diagnosis present

## 2020-04-28 DIAGNOSIS — F17210 Nicotine dependence, cigarettes, uncomplicated: Secondary | ICD-10-CM | POA: Diagnosis present

## 2020-04-28 DIAGNOSIS — F319 Bipolar disorder, unspecified: Secondary | ICD-10-CM | POA: Diagnosis present

## 2020-04-28 DIAGNOSIS — E785 Hyperlipidemia, unspecified: Secondary | ICD-10-CM | POA: Diagnosis present

## 2020-04-28 DIAGNOSIS — R4182 Altered mental status, unspecified: Secondary | ICD-10-CM

## 2020-04-28 DIAGNOSIS — N179 Acute kidney failure, unspecified: Secondary | ICD-10-CM | POA: Diagnosis present

## 2020-04-28 DIAGNOSIS — K5900 Constipation, unspecified: Secondary | ICD-10-CM | POA: Diagnosis present

## 2020-04-28 DIAGNOSIS — F4310 Post-traumatic stress disorder, unspecified: Secondary | ICD-10-CM | POA: Diagnosis present

## 2020-04-28 DIAGNOSIS — G9341 Metabolic encephalopathy: Secondary | ICD-10-CM | POA: Diagnosis present

## 2020-04-28 DIAGNOSIS — E869 Volume depletion, unspecified: Secondary | ICD-10-CM | POA: Diagnosis present

## 2020-04-28 DIAGNOSIS — E876 Hypokalemia: Secondary | ICD-10-CM | POA: Diagnosis present

## 2020-04-28 DIAGNOSIS — Z9114 Patient's other noncompliance with medication regimen: Secondary | ICD-10-CM

## 2020-04-28 DIAGNOSIS — E870 Hyperosmolality and hypernatremia: Secondary | ICD-10-CM

## 2020-04-28 DIAGNOSIS — E1110 Type 2 diabetes mellitus with ketoacidosis without coma: Secondary | ICD-10-CM | POA: Diagnosis not present

## 2020-04-28 DIAGNOSIS — R1310 Dysphagia, unspecified: Secondary | ICD-10-CM | POA: Diagnosis present

## 2020-04-28 DIAGNOSIS — E1010 Type 1 diabetes mellitus with ketoacidosis without coma: Secondary | ICD-10-CM | POA: Diagnosis not present

## 2020-04-28 DIAGNOSIS — F1910 Other psychoactive substance abuse, uncomplicated: Secondary | ICD-10-CM | POA: Diagnosis present

## 2020-04-28 DIAGNOSIS — Z794 Long term (current) use of insulin: Secondary | ICD-10-CM

## 2020-04-28 DIAGNOSIS — Z79899 Other long term (current) drug therapy: Secondary | ICD-10-CM

## 2020-04-28 DIAGNOSIS — D72829 Elevated white blood cell count, unspecified: Secondary | ICD-10-CM | POA: Diagnosis present

## 2020-04-28 LAB — COMPREHENSIVE METABOLIC PANEL
ALT: 22 U/L (ref 0–44)
AST: 25 U/L (ref 15–41)
Albumin: 3.9 g/dL (ref 3.5–5.0)
Alkaline Phosphatase: 119 U/L (ref 38–126)
BUN: 46 mg/dL — ABNORMAL HIGH (ref 8–23)
CO2: <7 mmol/L — ABNORMAL LOW (ref 22–32)
Calcium: 9.2 mg/dL (ref 8.9–10.3)
Chloride: 95 mmol/L — ABNORMAL LOW (ref 98–111)
Creatinine, Ser: 2.44 mg/dL — ABNORMAL HIGH (ref 0.44–1.00)
GFR, Estimated: 22 mL/min — ABNORMAL LOW (ref >60)
Glucose, Bld: 1358 mg/dL (ref 70–99)
Potassium: 5.9 mmol/L — ABNORMAL HIGH (ref 3.5–5.1)
Sodium: 132 mmol/L — ABNORMAL LOW (ref 135–145)
Total Bilirubin: 1.7 mg/dL — ABNORMAL HIGH (ref 0.3–1.2)
Total Protein: 6.9 g/dL (ref 6.5–8.1)

## 2020-04-28 LAB — I-STAT VENOUS BLOOD GAS, ED
Acid-base deficit: 27 mmol/L — ABNORMAL HIGH (ref 0.0–2.0)
Bicarbonate: 5.6 mmol/L — ABNORMAL LOW (ref 20.0–28.0)
Calcium, Ion: 1.17 mmol/L (ref 1.15–1.40)
HCT: 42 % (ref 36.0–46.0)
Hemoglobin: 14.3 g/dL (ref 12.0–15.0)
O2 Saturation: 66 %
Potassium: 7.5 mmol/L (ref 3.5–5.1)
Sodium: 129 mmol/L — ABNORMAL LOW (ref 135–145)
TCO2: 6 mmol/L — ABNORMAL LOW (ref 22–32)
pCO2, Ven: 29.3 mmHg — ABNORMAL LOW (ref 44.0–60.0)
pH, Ven: 6.89 — CL (ref 7.250–7.430)
pO2, Ven: 57 mmHg — ABNORMAL HIGH (ref 32.0–45.0)

## 2020-04-28 LAB — CBC
HCT: 42.8 % (ref 36.0–46.0)
Hemoglobin: 11.5 g/dL — ABNORMAL LOW (ref 12.0–15.0)
MCH: 28.1 pg (ref 26.0–34.0)
MCHC: 26.9 g/dL — ABNORMAL LOW (ref 30.0–36.0)
MCV: 104.6 fL — ABNORMAL HIGH (ref 80.0–100.0)
Platelets: 297 10*3/uL (ref 150–400)
RBC: 4.09 MIL/uL (ref 3.87–5.11)
RDW: 16.5 % — ABNORMAL HIGH (ref 11.5–15.5)
WBC: 16 10*3/uL — ABNORMAL HIGH (ref 4.0–10.5)
nRBC: 0 % (ref 0.0–0.2)

## 2020-04-28 LAB — CBG MONITORING, ED
Glucose-Capillary: >600 mg/dL (ref 70–99)
Glucose-Capillary: >600 mg/dL (ref 70–99)
Glucose-Capillary: >600 mg/dL (ref 70–99)

## 2020-04-28 MED ORDER — SODIUM CHLORIDE 0.9 % IV BOLUS
1000.0000 mL | Freq: Once | INTRAVENOUS | Status: AC
  Administered 2020-04-28: 1000 mL via INTRAVENOUS

## 2020-04-28 MED ORDER — SODIUM BICARBONATE 8.4 % IV SOLN
100.0000 meq | Freq: Once | INTRAVENOUS | Status: AC
  Administered 2020-04-28: 100 meq via INTRAVENOUS
  Filled 2020-04-28: qty 100

## 2020-04-28 MED ORDER — LACTATED RINGERS IV SOLN: INTRAVENOUS | Status: DC

## 2020-04-28 MED ORDER — STERILE WATER FOR INJECTION IV SOLN
INTRAVENOUS | Status: DC
  Filled 2020-04-28 (×2): qty 850

## 2020-04-28 MED ORDER — INSULIN REGULAR(HUMAN) IN NACL 100-0.9 UT/100ML-% IV SOLN
INTRAVENOUS | Status: DC
  Administered 2020-04-28: 7 [IU]/h via INTRAVENOUS
  Filled 2020-04-28: qty 100

## 2020-04-28 MED ORDER — ONDANSETRON HCL 4 MG/2ML IJ SOLN: 4.0000 mg | Freq: Once | INTRAMUSCULAR | Status: DC

## 2020-04-28 MED ORDER — DEXTROSE IN LACTATED RINGERS 5 % IV SOLN: INTRAVENOUS | Status: DC

## 2020-04-28 MED ORDER — DEXTROSE 50 % IV SOLN: 0.0000 mL | INTRAVENOUS | Status: DC | PRN

## 2020-04-28 NOTE — ED Provider Notes (Signed)
[facility] EMERGENCY DEPARTMENT Provider Note   CSN: [id number] Arrival date & time: 04/28/20  2013     History Chief Complaint  Patient presents with  . Altered Mental Status    Meagan Cummings is a 64 y.o. female.  The history is provided by the patient and medical records.  Altered Mental Status   LEVEL V CAVEAT:  AMS  64 year old female with history of diabetes, depression, and frequent admissions for metabolic encephalopathy and DKA, presenting to the ED with altered mental status.  Per son, she is overall noncompliant with her diabetic medications and has been confused and lethargic all day today.  Patient was noted to be uncooperative in triage and began urinating on herself.  There is not been any noted fever or other illness.  Past Medical History:  Diagnosis Date  . Depression   . Diabetes mellitus without complication [facility])     Patient Active Problem List   Diagnosis Date Noted  . Ketosis (HCC) 12/25/2019  . Acute metabolic encephalopathy 12/25/2019  . Hypoglycemia 12/24/2019  . Acute metabolic encephalopathy 12/24/2019  . Uncontrolled diabetes mellitus (HCC) 12/24/2019  . Acute renal failure syndrome (HCC)   . Diabetic ketoacidosis (HCC)     Past Surgical History:  Procedure Laterality Date  . ABDOMINAL HYSTERECTOMY       OB History   No obstetric history on file.     No family history on file.  Social History   Tobacco Use  . Smoking status: Current Every Day Smoker    Packs/day: 0.50    Types: Cigarettes  . Smokeless tobacco: Never Used  Substance Use Topics  . Alcohol use: Yes    Alcohol/week: 1.0 standard drink    Types: 1 Cans of beer per week  . Drug use: No    Home Medications Prior to Admission medications   Medication Sig Start Date End Date Taking? Authorizing Provider  DULoxetine (CYMBALTA) 60 MG capsule Take 60 mg by mouth daily. 12/04/19   [provider]  fluticasone (FLONASE) 50 MCG/ACT  nasal spray Place 1 spray into both nostrils daily. Patient not taking: Reported on 12/26/2019 12/26/14   Albertine Grates, MD  gabapentin (NEURONTIN) 400 MG capsule Take 800 mg by mouth 3 (three) times daily. 11/27/19   [provider]  glucose blood (ACCU-CHEK AVIVA PLUS) test strip 1 each 4 (four) times daily. 12/12/14   [provider]  insulin aspart (NOVOLOG FLEXPEN) 100 UNIT/ML FlexPen Before each meal 3 times a day, 140-199 - 1 units, 200-250 - 3 units, 251-299 - 5 units,  300-349 - 6 units,  350 or above 8 units. Insulin PEN if approved, provide syringes and needles if needed. 01/01/20   Yvette Rack, MD  Insulin Glargine (BASAGLAR KWIKPEN) 100 UNIT/ML Inject 0.25 mLs (25 Units total) into the skin daily. 01/01/20 03/31/20  Yvette Rack, MD  QUEtiapine (SEROQUEL) 100 MG tablet Take 100 mg by mouth at bedtime. 12/17/19   [provider]  tiotropium (SPIRIVA) 18 MCG inhalation capsule Place 18 mcg into inhaler and inhale daily. Patient not taking: Reported on 12/26/2019 01/05/14 01/05/15  [provider]    Allergies    Other  Review of Systems   Review of Systems  Unable to perform ROS: Mental status change    Physical Exam Updated Vital Signs BP (!) 149/134 (BP Location: Right Arm)   Pulse 80   Temp 98.6 F (37 C) (Oral)   Resp 18   Ht 5'  2" (1.575 m)   Wt 54.3 kg   SpO2 91%   BMI 21.90 kg/m   Physical Exam Vitals and nursing note reviewed.  Constitutional:      Appearance: She is well-developed.  HENT:     Head: Normocephalic and atraumatic.     Mouth/Throat:     Comments: Fruity breath Eyes:     Conjunctiva/sclera: Conjunctivae normal.     Pupils: Pupils are equal, round, and reactive to light.  Cardiovascular:     Rate and Rhythm: Normal rate and regular rhythm.     Heart sounds: Normal heart sounds.  Pulmonary:     Effort: Pulmonary effort is normal. Tachypnea present. No respiratory distress.     Breath sounds: Normal breath sounds. No  rhonchi.     Comments: Kussmaul respirations Abdominal:     General: Bowel sounds are normal.     Palpations: Abdomen is soft.     Tenderness: There is no abdominal tenderness. There is no rebound.  Musculoskeletal:        General: Normal range of motion.     Cervical back: Normal range of motion.  Skin:    General: Skin is warm and dry.  Neurological:     Comments: Awake, altered, moving arms and legs spontaneously but does not follow commands when prompted     ED Results / Procedures / Treatments   Labs (all labs ordered are listed, but only abnormal results are displayed) Labs Reviewed  COMPREHENSIVE METABOLIC PANEL - Abnormal; Notable for the following components:      Result Value   Sodium 132 (*)    Potassium 5.9 (*)    Chloride 95 (*)    CO2 <7 (*)    Glucose, Bld 1,358 (*)    BUN 46 (*)    Creatinine, Ser 2.44 (*)    Total Bilirubin 1.7 (*)    GFR, Estimated 22 (*)    All other components within normal limits  CBC - Abnormal; Notable for the following components:   WBC 16.0 (*)    Hemoglobin 11.5 (*)    MCV 104.6 (*)    MCHC 26.9 (*)    RDW 16.5 (*)    All other components within normal limits  CBG MONITORING, ED - Abnormal; Notable for the following components:   Glucose-Capillary >600 (*)    All other components within normal limits  CBG MONITORING, ED - Abnormal; Notable for the following components:   Glucose-Capillary >600 (*)    All other components within normal limits  I-STAT VENOUS BLOOD GAS, ED - Abnormal; Notable for the following components:   pH, Ven 6.890 (*)    pCO2, Ven 29.3 (*)    pO2, Ven 57.0 (*)    Bicarbonate 5.6 (*)    TCO2 6 (*)    Acid-base deficit 27.0 (*)    Sodium 129 (*)    Potassium 7.5 (*)    All other components within normal limits  CBG MONITORING, ED - Abnormal; Notable for the following components:   Glucose-Capillary >600 (*)    All other components within normal limits  RESPIRATORY PANEL BY RT PCR (FLU A&B, COVID)    I-STAT CHEM 8, ED    EKG None  Radiology DG Chest Port 1 View  Result Date: 04/28/2020 CLINICAL DATA:  65 year old female with altered mental status EXAM: PORTABLE CHEST 1 VIEW COMPARISON:  Chest radiograph dated 12/24/2019. FINDINGS: No focal consolidation, pleural effusion, pneumothorax. The cardiac silhouette is within limits. No acute osseous pathology.  IMPRESSION: No active disease. Electronically Signed   By: Elgie Collard M.D.   On: 04/28/2020 22:33    Procedures Procedures (including critical care time)  CRITICAL CARE Performed by: Garlon Hatchet   Total critical care time: 60 minutes  Critical care time was exclusive of separately billable procedures and treating other patients.  Critical care was necessary to treat or prevent imminent or life-threatening deterioration.  Critical care was time spent personally by me on the following activities: development of treatment plan with patient and/or surrogate as well as nursing, discussions with consultants, evaluation of patient's response to treatment, examination of patient, obtaining history from patient or surrogate, ordering and performing treatments and interventions, ordering and review of laboratory studies, ordering and review of radiographic studies, pulse oximetry and re-evaluation of patient's condition.   Medications Ordered in ED Medications  insulin regular, human (MYXREDLIN) 100 units/ 100 mL infusion (7 Units/hr Intravenous Rate/Dose Verify 04/28/20 2336)  lactated ringers infusion ( Intravenous New Bag/Given 04/28/20 2340)  dextrose 5 % in lactated ringers infusion (has no administration in time range)  dextrose 50 % solution 0-50 mL (has no administration in time range)  sodium bicarbonate 150 mEq in sterile water 1,000 mL infusion ( Intravenous New Bag/Given 04/28/20 2340)  sodium chloride 0.9 % bolus 1,000 mL (0 mLs Intravenous Stopped 04/28/20 2340)  sodium chloride 0.9 % bolus 1,000 mL (0 mLs  Intravenous Stopped 04/28/20 2340)  sodium bicarbonate injection 100 mEq (100 mEq Intravenous Given 04/28/20 2236)    ED Course  I have reviewed the triage vital signs and the nursing notes.  Pertinent labs & imaging results that were available during my care of the patient were reviewed by me and considered in my medical decision making (see chart for details).    MDM Rules/Calculators/A&P  64 y.o. F here with AMS.  Has hx of DM and frequent episodes of DKA and metabolic encephalopathy due to medication non-compliance.  CBG in triage >600.  Patient is awake but lethargic and obviously altered.  She is hemodynamically stable on room air and protecting her airway.  She is spontaneously moving her arms and legs but does not follow commands.  She does track with her eyes.  Labs consistent with severe DKA with anion gap of 22, bicarb <7, pH 6.890.  She was started on 2L IVF, insulin drip, given 2 amps of bicarb followed by bicarb drip.  She will need admission to ICU.  12:04 AM Critical care has evaluated in the ED-- will admit for ongoing care, continue DKA treatment.  Final Clinical Impression(s) / ED Diagnoses Final diagnoses:  Diabetic ketoacidosis without coma associated with type 2 diabetes mellitus (HCC)  Altered mental status, unspecified altered mental status type    Rx / DC Orders ED Discharge Orders    None       Garlon Hatchet, PA-C 04/29/20 0005    Maia Plan, MD 05/01/20 1000

## 2020-04-28 NOTE — ED Notes (Signed)
  CBG HI >600  

## 2020-04-28 NOTE — ED Triage Notes (Addendum)
Pt brought to ED by son for AMS and diabetic problems. CBG gotten on triage with a high Result. Pt very uncooperative on triage.

## 2020-04-28 NOTE — ED Notes (Signed)
Pt is altered not staying still in bed, grabbing staff, pulling off leads and attempted to pull out IV's. EDP has been notified.

## 2020-04-29 DIAGNOSIS — R4182 Altered mental status, unspecified: Secondary | ICD-10-CM | POA: Diagnosis not present

## 2020-04-29 DIAGNOSIS — D72829 Elevated white blood cell count, unspecified: Secondary | ICD-10-CM | POA: Diagnosis present

## 2020-04-29 DIAGNOSIS — E1110 Type 2 diabetes mellitus with ketoacidosis without coma: Secondary | ICD-10-CM | POA: Diagnosis present

## 2020-04-29 DIAGNOSIS — F4310 Post-traumatic stress disorder, unspecified: Secondary | ICD-10-CM | POA: Diagnosis present

## 2020-04-29 DIAGNOSIS — R404 Transient alteration of awareness: Secondary | ICD-10-CM | POA: Diagnosis not present

## 2020-04-29 DIAGNOSIS — F1910 Other psychoactive substance abuse, uncomplicated: Secondary | ICD-10-CM | POA: Diagnosis present

## 2020-04-29 DIAGNOSIS — E876 Hypokalemia: Secondary | ICD-10-CM | POA: Diagnosis present

## 2020-04-29 DIAGNOSIS — E869 Volume depletion, unspecified: Secondary | ICD-10-CM | POA: Diagnosis present

## 2020-04-29 DIAGNOSIS — K5900 Constipation, unspecified: Secondary | ICD-10-CM | POA: Diagnosis present

## 2020-04-29 DIAGNOSIS — Z20822 Contact with and (suspected) exposure to covid-19: Secondary | ICD-10-CM | POA: Diagnosis present

## 2020-04-29 DIAGNOSIS — R9431 Abnormal electrocardiogram [ECG] [EKG]: Secondary | ICD-10-CM | POA: Diagnosis not present

## 2020-04-29 DIAGNOSIS — Z794 Long term (current) use of insulin: Secondary | ICD-10-CM | POA: Diagnosis not present

## 2020-04-29 DIAGNOSIS — R1310 Dysphagia, unspecified: Secondary | ICD-10-CM | POA: Diagnosis present

## 2020-04-29 DIAGNOSIS — E1010 Type 1 diabetes mellitus with ketoacidosis without coma: Secondary | ICD-10-CM | POA: Diagnosis present

## 2020-04-29 DIAGNOSIS — F17210 Nicotine dependence, cigarettes, uncomplicated: Secondary | ICD-10-CM | POA: Diagnosis present

## 2020-04-29 DIAGNOSIS — E870 Hyperosmolality and hypernatremia: Secondary | ICD-10-CM | POA: Diagnosis present

## 2020-04-29 DIAGNOSIS — F319 Bipolar disorder, unspecified: Secondary | ICD-10-CM | POA: Diagnosis present

## 2020-04-29 DIAGNOSIS — Z9114 Patient's other noncompliance with medication regimen: Secondary | ICD-10-CM | POA: Diagnosis not present

## 2020-04-29 DIAGNOSIS — E785 Hyperlipidemia, unspecified: Secondary | ICD-10-CM | POA: Diagnosis present

## 2020-04-29 DIAGNOSIS — N179 Acute kidney failure, unspecified: Secondary | ICD-10-CM | POA: Diagnosis present

## 2020-04-29 DIAGNOSIS — G9341 Metabolic encephalopathy: Secondary | ICD-10-CM | POA: Diagnosis present

## 2020-04-29 DIAGNOSIS — Z79899 Other long term (current) drug therapy: Secondary | ICD-10-CM | POA: Diagnosis not present

## 2020-04-29 LAB — RESPIRATORY PANEL BY RT PCR (FLU A&B, COVID)
Influenza A by PCR: NEGATIVE
Influenza B by PCR: NEGATIVE
SARS Coronavirus 2 by RT PCR: NEGATIVE

## 2020-04-29 LAB — BASIC METABOLIC PANEL
Anion gap: 25 — ABNORMAL HIGH (ref 5–15)
Anion gap: 17 — ABNORMAL HIGH (ref 5–15)
Anion gap: 12 (ref 5–15)
Anion gap: 11 (ref 5–15)
Anion gap: 12 (ref 5–15)
BUN: 47 mg/dL — ABNORMAL HIGH (ref 8–23)
BUN: 39 mg/dL — ABNORMAL HIGH (ref 8–23)
BUN: 33 mg/dL — ABNORMAL HIGH (ref 8–23)
BUN: 26 mg/dL — ABNORMAL HIGH (ref 8–23)
BUN: 20 mg/dL (ref 8–23)
BUN: 16 mg/dL (ref 8–23)
CO2: <7 mmol/L — ABNORMAL LOW (ref 22–32)
CO2: 14 mmol/L — ABNORMAL LOW (ref 22–32)
CO2: 23 mmol/L (ref 22–32)
CO2: 26 mmol/L (ref 22–32)
CO2: 27 mmol/L (ref 22–32)
CO2: 24 mmol/L (ref 22–32)
Calcium: 8.1 mg/dL — ABNORMAL LOW (ref 8.9–10.3)
Calcium: 8.3 mg/dL — ABNORMAL LOW (ref 8.9–10.3)
Calcium: 8.6 mg/dL — ABNORMAL LOW (ref 8.9–10.3)
Calcium: 8.4 mg/dL — ABNORMAL LOW (ref 8.9–10.3)
Calcium: 8.1 mg/dL — ABNORMAL LOW (ref 8.9–10.3)
Calcium: 8.2 mg/dL — ABNORMAL LOW (ref 8.9–10.3)
Chloride: 105 mmol/L (ref 98–111)
Chloride: 111 mmol/L (ref 98–111)
Chloride: 115 mmol/L — ABNORMAL HIGH (ref 98–111)
Chloride: 116 mmol/L — ABNORMAL HIGH (ref 98–111)
Chloride: 118 mmol/L — ABNORMAL HIGH (ref 98–111)
Chloride: 117 mmol/L — ABNORMAL HIGH (ref 98–111)
Creatinine, Ser: 2.41 mg/dL — ABNORMAL HIGH (ref 0.44–1.00)
Creatinine, Ser: 2.12 mg/dL — ABNORMAL HIGH (ref 0.44–1.00)
Creatinine, Ser: 1.39 mg/dL — ABNORMAL HIGH (ref 0.44–1.00)
Creatinine, Ser: 1.29 mg/dL — ABNORMAL HIGH (ref 0.44–1.00)
Creatinine, Ser: 0.92 mg/dL (ref 0.44–1.00)
Creatinine, Ser: 0.8 mg/dL (ref 0.44–1.00)
GFR, Estimated: 22 mL/min — ABNORMAL LOW (ref >60)
GFR, Estimated: 26 mL/min — ABNORMAL LOW (ref >60)
GFR, Estimated: 43 mL/min — ABNORMAL LOW (ref >60)
GFR, Estimated: 47 mL/min — ABNORMAL LOW (ref >60)
GFR, Estimated: >60 mL/min (ref >60)
GFR, Estimated: >60 mL/min (ref >60)
Glucose, Bld: 1126 mg/dL (ref 70–99)
Glucose, Bld: 614 mg/dL (ref 70–99)
Glucose, Bld: 231 mg/dL — ABNORMAL HIGH (ref 70–99)
Glucose, Bld: 238 mg/dL — ABNORMAL HIGH (ref 70–99)
Glucose, Bld: 174 mg/dL — ABNORMAL HIGH (ref 70–99)
Glucose, Bld: 197 mg/dL — ABNORMAL HIGH (ref 70–99)
Potassium: 5.3 mmol/L — ABNORMAL HIGH (ref 3.5–5.1)
Potassium: 4.1 mmol/L (ref 3.5–5.1)
Potassium: 4.1 mmol/L (ref 3.5–5.1)
Potassium: 3.4 mmol/L — ABNORMAL LOW (ref 3.5–5.1)
Potassium: 3.4 mmol/L — ABNORMAL LOW (ref 3.5–5.1)
Potassium: 3.6 mmol/L (ref 3.5–5.1)
Sodium: 143 mmol/L (ref 135–145)
Sodium: 150 mmol/L — ABNORMAL HIGH (ref 135–145)
Sodium: 155 mmol/L — ABNORMAL HIGH (ref 135–145)
Sodium: 154 mmol/L — ABNORMAL HIGH (ref 135–145)
Sodium: 156 mmol/L — ABNORMAL HIGH (ref 135–145)
Sodium: 153 mmol/L — ABNORMAL HIGH (ref 135–145)

## 2020-04-29 LAB — GLUCOSE, CAPILLARY
Glucose-Capillary: 152 mg/dL — ABNORMAL HIGH (ref 70–99)
Glucose-Capillary: 166 mg/dL — ABNORMAL HIGH (ref 70–99)
Glucose-Capillary: 169 mg/dL — ABNORMAL HIGH (ref 70–99)
Glucose-Capillary: 173 mg/dL — ABNORMAL HIGH (ref 70–99)
Glucose-Capillary: 175 mg/dL — ABNORMAL HIGH (ref 70–99)
Glucose-Capillary: 182 mg/dL — ABNORMAL HIGH (ref 70–99)
Glucose-Capillary: 188 mg/dL — ABNORMAL HIGH (ref 70–99)
Glucose-Capillary: 194 mg/dL — ABNORMAL HIGH (ref 70–99)
Glucose-Capillary: 198 mg/dL — ABNORMAL HIGH (ref 70–99)
Glucose-Capillary: 222 mg/dL — ABNORMAL HIGH (ref 70–99)
Glucose-Capillary: 250 mg/dL — ABNORMAL HIGH (ref 70–99)
Glucose-Capillary: 263 mg/dL — ABNORMAL HIGH (ref 70–99)
Glucose-Capillary: 308 mg/dL — ABNORMAL HIGH (ref 70–99)
Glucose-Capillary: 397 mg/dL — ABNORMAL HIGH (ref 70–99)
Glucose-Capillary: 559 mg/dL (ref 70–99)
Glucose-Capillary: >600 mg/dL (ref 70–99)
Glucose-Capillary: >600 mg/dL (ref 70–99)
Glucose-Capillary: >600 mg/dL (ref 70–99)
Glucose-Capillary: >600 mg/dL (ref 70–99)

## 2020-04-29 LAB — URINALYSIS, ROUTINE W REFLEX MICROSCOPIC
Bilirubin Urine: NEGATIVE
Glucose, UA: >=500 mg/dL — AB
Ketones, ur: 80 mg/dL — AB
Leukocytes,Ua: NEGATIVE
Nitrite: NEGATIVE
Protein, ur: NEGATIVE mg/dL
Specific Gravity, Urine: 1.022 (ref 1.005–1.030)
pH: 5 (ref 5.0–8.0)

## 2020-04-29 LAB — CBC
HCT: 37.4 % (ref 36.0–46.0)
Hemoglobin: 10.6 g/dL — ABNORMAL LOW (ref 12.0–15.0)
MCH: 28.6 pg (ref 26.0–34.0)
MCHC: 28.3 g/dL — ABNORMAL LOW (ref 30.0–36.0)
MCV: 101.1 fL — ABNORMAL HIGH (ref 80.0–100.0)
Platelets: 261 10*3/uL (ref 150–400)
RBC: 3.7 MIL/uL — ABNORMAL LOW (ref 3.87–5.11)
RDW: 16.2 % — ABNORMAL HIGH (ref 11.5–15.5)
WBC: 16.8 10*3/uL — ABNORMAL HIGH (ref 4.0–10.5)
nRBC: 0 % (ref 0.0–0.2)

## 2020-04-29 LAB — CBG MONITORING, ED
Glucose-Capillary: >600 mg/dL (ref 70–99)
Glucose-Capillary: >600 mg/dL (ref 70–99)
Glucose-Capillary: >600 mg/dL (ref 70–99)

## 2020-04-29 LAB — BLOOD GAS, VENOUS
Acid-Base Excess: 4 mmol/L — ABNORMAL HIGH (ref 0.0–2.0)
Bicarbonate: 28.1 mmol/L — ABNORMAL HIGH (ref 20.0–28.0)
FIO2: 21
O2 Saturation: 62 %
Patient temperature: 37
pCO2, Ven: 43.2 mmHg — ABNORMAL LOW (ref 44.0–60.0)
pH, Ven: 7.429 (ref 7.250–7.430)
pO2, Ven: 32.8 mmHg (ref 32.0–45.0)

## 2020-04-29 LAB — RAPID URINE DRUG SCREEN, HOSP PERFORMED
Amphetamines: NOT DETECTED
Barbiturates: NOT DETECTED
Benzodiazepines: NOT DETECTED
Cocaine: NOT DETECTED
Opiates: NOT DETECTED
Tetrahydrocannabinol: NOT DETECTED

## 2020-04-29 LAB — BETA-HYDROXYBUTYRIC ACID
Beta-Hydroxybutyric Acid: >8 mmol/L — ABNORMAL HIGH (ref 0.05–0.27)
Beta-Hydroxybutyric Acid: 2.46 mmol/L — ABNORMAL HIGH (ref 0.05–0.27)
Beta-Hydroxybutyric Acid: 1.24 mmol/L — ABNORMAL HIGH (ref 0.05–0.27)

## 2020-04-29 LAB — MRSA PCR SCREENING: MRSA by PCR: NEGATIVE

## 2020-04-29 LAB — MAGNESIUM: Magnesium: 1.9 mg/dL (ref 1.7–2.4)

## 2020-04-29 LAB — LACTIC ACID, PLASMA: Lactic Acid, Venous: 2.2 mmol/L (ref 0.5–1.9)

## 2020-04-29 MED ORDER — DEXTROSE 5 % IV SOLN: INTRAVENOUS | Status: DC

## 2020-04-29 MED ORDER — CHLORHEXIDINE GLUCONATE CLOTH 2 % EX PADS
6.0000 | MEDICATED_PAD | Freq: Every day | CUTANEOUS | Status: DC
  Administered 2020-04-30 – 2020-05-03 (×5): 6 via TOPICAL

## 2020-04-29 MED ORDER — LACTATED RINGERS IV BOLUS
1000.0000 mL | Freq: Once | INTRAVENOUS | Status: AC
  Administered 2020-04-29: 1000 mL via INTRAVENOUS

## 2020-04-29 MED ORDER — HEPARIN SODIUM (PORCINE) 5000 UNIT/ML IJ SOLN
5000.0000 [IU] | Freq: Three times a day (TID) | INTRAMUSCULAR | Status: DC
  Administered 2020-04-29 – 2020-05-03 (×12): 5000 [IU] via SUBCUTANEOUS
  Filled 2020-04-29 (×12): qty 1

## 2020-04-29 MED ORDER — INSULIN GLARGINE 100 UNIT/ML ~~LOC~~ SOLN
20.0000 [IU] | SUBCUTANEOUS | Status: DC
  Administered 2020-04-29: 20 [IU] via SUBCUTANEOUS
  Filled 2020-04-29 (×2): qty 0.2

## 2020-04-29 MED ORDER — INSULIN ASPART 100 UNIT/ML ~~LOC~~ SOLN
0.0000 [IU] | Freq: Every day | SUBCUTANEOUS | Status: DC
  Administered 2020-04-29: 0 [IU] via SUBCUTANEOUS
  Administered 2020-04-30: 3 [IU] via SUBCUTANEOUS

## 2020-04-29 MED ORDER — DEXTROSE 50 % IV SOLN: 0.0000 mL | INTRAVENOUS | Status: DC | PRN

## 2020-04-29 MED ORDER — DEXTROSE-NACL 5-0.45 % IV SOLN: INTRAVENOUS | Status: DC

## 2020-04-29 MED ORDER — KCL IN DEXTROSE-NACL 40-5-0.45 MEQ/L-%-% IV SOLN
INTRAVENOUS | Status: DC
  Filled 2020-04-29 (×7): qty 1000

## 2020-04-29 MED ORDER — DEXTROSE IN LACTATED RINGERS 5 % IV SOLN: INTRAVENOUS | Status: DC

## 2020-04-29 MED ORDER — INSULIN REGULAR(HUMAN) IN NACL 100-0.9 UT/100ML-% IV SOLN
INTRAVENOUS | Status: DC
  Administered 2020-04-29: 1.8 [IU]/h via INTRAVENOUS
  Administered 2020-04-29: 7 [IU]/h via INTRAVENOUS
  Filled 2020-04-29 (×3): qty 100

## 2020-04-29 MED ORDER — INSULIN ASPART 100 UNIT/ML ~~LOC~~ SOLN
0.0000 [IU] | Freq: Three times a day (TID) | SUBCUTANEOUS | Status: DC
  Administered 2020-04-30: 5 [IU] via SUBCUTANEOUS
  Administered 2020-04-30: 2 [IU] via SUBCUTANEOUS
  Administered 2020-04-30: 3 [IU] via SUBCUTANEOUS
  Administered 2020-05-01: 2 [IU] via SUBCUTANEOUS
  Administered 2020-05-01: 8 [IU] via SUBCUTANEOUS
  Administered 2020-05-01: 5 [IU] via SUBCUTANEOUS
  Administered 2020-05-02: 15 [IU] via SUBCUTANEOUS
  Administered 2020-05-02: 3 [IU] via SUBCUTANEOUS
  Administered 2020-05-02: 8 [IU] via SUBCUTANEOUS

## 2020-04-29 MED ORDER — LACTATED RINGERS IV SOLN: INTRAVENOUS | Status: DC

## 2020-04-29 NOTE — Progress Notes (Addendum)
Inpatient Diabetes Program Recommendations  AACE/ADA: New Consensus Statement on Inpatient Glycemic Control (2015)  Target Ranges:  Prepandial:   less than 140 mg/dL      Peak postprandial:   less than 180 mg/dL (1-2 hours)      Critically ill patients:  140 - 180 mg/dL   Lab Results  Component Value Date   GLUCAP 263 (H) 04/29/2020   HGBA1C 10.9 (H) 12/24/2019    Review of Glycemic Control Results for Meagan Cummings, Meagan Cummings (MRN [id number]) as of 04/29/2020 11:55  Ref. Range 04/29/2020 05:40 04/29/2020 06:52 04/29/2020 07:56 04/29/2020 08:56 04/29/2020 10:08 04/29/2020 11:22  Glucose-Capillary Latest Ref Range: 70 - 99 mg/dL 283 (H) 662 (H) 947 (H) 198 (H) 188 (H) 263 (H)   Diabetes history: DM 2 Outpatient Diabetes medications:  Novolog 1-8 units tid with meals, Lantus 25 units daily Current orders for Inpatient glycemic control:  IV insulin/ DKA orders Inpatient Diabetes Program Recommendations:    When acidosis cleared, consider adding Lantus 15 units 2 hours prior to d/c of insulin drip.  She will also likely need Novolog very sensitive (0-6 units) tid with meals and HS as well as Novolog meal coverage 3 units tid with meals (hold if patient eats less than 50% or NPO).   Will follow.  It appears that patient had recent admit in September 2021 and also Behavioral Health admit for depression.  May benefit from psych consult if appropriate?   Thanks,  Beryl Meager, RN, BC-ADM Inpatient Diabetes Coordinator Pager [phone] addendum:  Spoke with patient's son by phone.  He states that patient has only been home about a week prior to readmission in the hospital.  Son works 12 hour shifts and states that he is often not home with his mother.  He states that since he was 61 years old, his mother has been in and out of the hospital.  He reports mental illness and states that in the past when she has had help, she often gets angry and sends the caregivers away.  Son states that he  cannot care for her 24 hours a day due to work and that he and his family need a plan that will help his mother and keep her safe.  Will report to [facility] RN.

## 2020-04-29 NOTE — H&P (Signed)
NAME:  Meagan Cummings, MRN:  [id number], DOB:  [birth year], LOS: 0 ADMISSION DATE:  04/28/2020, CONSULTATION DATE: 04/28/2020  CHIEF COMPLAINT:   Altered mental status  Brief History   This is a 64 year old black female who presented with altered mental status and found to be in DKA.  History of present illness   This is a 64 year old black female that presented to the emergency room from home.  Patient's son states that over the last several months her neurologic condition is steadily declining.  She is a known diabetic and does not consistently take her medications as prescribed.  Patient's son reported to the ER staff that the patient had not been acting herself for several days.  He also had stated that she had been noncompliant with her medications and adjusted them according to how she felt rather than how they were prescribed.  On arrival to the emergency room she was fairly obtunded not following commands or interacting with surroundings but is improved with volume resuscitation and sodium bicarbonate solution.  She currently is essentially nonverbal but interacts with her surroundings moving all 4 extremities and intermittently able to follow basic commands.  She does nod yes and no to some questions.  Past Medical History  Diabetes mellitus type 1 Polysubstance abuse  Objective   Blood pressure 101/73, pulse 90, temperature 98.6 F (37 C), temperature source Oral, resp. rate (!) 24, height 5\' 2"  (1.575 m), weight 54.3 kg, SpO2 100 %.       No intake or output data in the 24 hours ending 04/29/20 0034 Filed Weights   04/28/20 2026  Weight: 54.3 kg    Examination: General: Atraumatic/normocephalic mucous membranes are moist.  HENT:  Poor dentition.  Mucous membranes are moist trachea is midline Lungs: Clear to auscultation bilaterally.  No wheezing rales or rhonchi noted. Cardiovascular: Regular rate no rub murmur gallop appreciated Abdomen: Soft, nondistended, no  rebound/rigidity/guarding.  Positive bowel sounds. Extremities: Distal pulse intact x4.  No significant edema.  Positive clubbing. Neuro: Awake and alert does not consistently follow commands but moves all 4 extremities spontaneously.  Intermittently interacts with viewed nonverbal. GU: Pure wick catheter in place    Assessment & Plan:  DKA Metabolic encephalopathy Acute kidney injury Noncompliance Diabetes mellitus type 1  history of polysubstance abuse  Plan: Admit to the intensive care unit for further work-up. Continue volume resuscitation. Started on DKA protocol. Closely monitor neurologic status.  Discussed with emergency room staff.  Patient is slowly recovering neurologically. Serial neurochecks. Strictly n.p.o. No indication for antibiotics at this time. Monitor I's/O's avoid nephrotoxic medications Continue sodium bicarbonate solution. Frequent labs per DKA order set.   Best practice:  Diet: N.p.o. Pain/Anxiety/Delirium protocol (if indicated): N/A VAP protocol (if indicated): N/A DVT prophylaxis: Heparin subcu GI prophylaxis: Protonix Glucose control: DKA order set Mobility: Bedrest Code Status: Full Family Communication: Patient signed and left prior to my arrival. Disposition: Admit to ICU  Labs   CBC: Recent Labs  Lab 04/28/20 2040 04/28/20 2247  WBC 16.0*  --   HGB 11.5* 14.3  HCT 42.8 42.0  MCV 104.6*  --   PLT 297  --     Basic Metabolic Panel: Recent Labs  Lab 04/28/20 2040 04/28/20 2247  NA 132* 129*  K 5.9* 7.5*  CL 95*  --   CO2 <7*  --   GLUCOSE 1,358*  --   BUN 46*  --   CREATININE 2.44*  --   CALCIUM 9.2  --  GFR: Estimated Creatinine Clearance: 18.7 mL/min (A) (by C-G formula based on SCr of 2.44 mg/dL (H)). Recent Labs  Lab 04/28/20 2040  WBC 16.0*    Liver Function Tests: Recent Labs  Lab 04/28/20 2040  AST 25  ALT 22  ALKPHOS 119  BILITOT 1.7*  PROT 6.9  ALBUMIN 3.9   No results for input(s): LIPASE,  AMYLASE in the last 168 hours. No results for input(s): AMMONIA in the last 168 hours.  ABG    Component Value Date/Time   PHART 6.917 (LL) 12/22/2014 0657   PCO2ART 13.9 (LL) 12/22/2014 0657   PO2ART 144.0 (H) 12/22/2014 0657   HCO3 5.6 (L) 04/28/2020 2247   TCO2 6 (L) 04/28/2020 2247   ACIDBASEDEF 27.0 (H) 04/28/2020 2247   O2SAT 66.0 04/28/2020 2247     Coagulation Profile: No results for input(s): INR, PROTIME in the last 168 hours.  Cardiac Enzymes: No results for input(s): CKTOTAL, CKMB, CKMBINDEX, TROPONINI in the last 168 hours.  HbA1C: Hgb A1c MFr Bld  Date/Time Value Ref Range Status  12/24/2019 05:37 PM 10.9 (H) 4.8 - 5.6 % Final    Comment:    (NOTE) Pre diabetes:          5.7%-6.4%  Diabetes:              >6.4%  Glycemic control for   <7.0% adults with diabetes   12/24/2014 07:00 AM 13.3 (H) 4.8 - 5.6 % Final    Comment:    (NOTE)         Pre-diabetes: 5.7 - 6.4         Diabetes: >6.4         Glycemic control for adults with diabetes: <7.0     CBG: Recent Labs  Lab 04/28/20 2021 04/28/20 2248 04/28/20 2331 04/29/20 0019  GLUCAP >600* >600* >600* >600*    Review of Systems:   Unable to evaluate due to [facility]  Past Medical History  She,  has a past medical history of Depression and Diabetes mellitus without complication (HCC).   Surgical History    Past Surgical History:  Procedure Laterality Date  . ABDOMINAL HYSTERECTOMY       Social History   reports that she has been smoking cigarettes. She has been smoking about 0.50 packs per day. She has never used smokeless tobacco. She reports current alcohol use of about 1.0 standard drink of alcohol per week. She reports that she does not use drugs.   Family History   Her family history is not on file.   Allergies Allergies  Allergen Reactions  . Other Rash    Pt gets rash on face and chest from cat and dog hair     Home Medications  Prior to Admission medications   Medication Sig  Start Date End Date Taking? Authorizing Provider  DULoxetine (CYMBALTA) 60 MG capsule Take 60 mg by mouth daily. 12/04/19   [provider]  fluticasone (FLONASE) 50 MCG/ACT nasal spray Place 1 spray into both nostrils daily. Patient not taking: Reported on 12/26/2019 12/26/14   Albertine Grates, MD  gabapentin (NEURONTIN) 400 MG capsule Take 800 mg by mouth 3 (three) times daily. 11/27/19   [provider]  glucose blood (ACCU-CHEK AVIVA PLUS) test strip 1 each 4 (four) times daily. 12/12/14   [provider]  insulin aspart (NOVOLOG FLEXPEN) 100 UNIT/ML FlexPen Before each meal 3 times a day, 140-199 - 1 units, 200-250 - 3 units, 251-299 - 5 units,  300-349 -  6 units,  350 or above 8 units. Insulin PEN if approved, provide syringes and needles if needed. 01/01/20   Yvette Rack, MD  Insulin Glargine (BASAGLAR KWIKPEN) 100 UNIT/ML Inject 0.25 mLs (25 Units total) into the skin daily. 01/01/20 03/31/20  Yvette Rack, MD  QUEtiapine (SEROQUEL) 100 MG tablet Take 100 mg by mouth at bedtime. 12/17/19   [provider]  tiotropium (SPIRIVA) 18 MCG inhalation capsule Place 18 mcg into inhaler and inhale daily. Patient not taking: Reported on 12/26/2019 01/05/14 01/05/15  [provider]     Critical care time: 

## 2020-04-29 NOTE — ED Notes (Signed)
  CBG HI >600  

## 2020-04-29 NOTE — Plan of Care (Signed)

## 2020-04-29 NOTE — TOC Initial Note (Signed)
Transition of Care [facility]) - Initial/Assessment Note    Patient Details  Name: Meagan Cummings MRN: [id number] Date of Birth: Oct 11, 1955  Transition of [facility]) CM/SW Contact:    Lawerance Sabal, RN Phone Number: 04/29/2020, 3:45 PM  Clinical Narrative:          Sherron Monday w patient's son. He states that patient has been at [facility] for 3 weeks, and [facility] for 2 weeks within the past 2 months. We discussed that there was not likely medicare days for SNF, son not interested in going under medicaid, nor did he want to "waiste resources." We discussed patient's noncompliance with insulin and supporting her with [facility] services at home. He provided me with a number for he [facility] company that [place] set her up with. It was [employer]. They will be able to take her back. Would recommend full HH services. Please place orders for RN PT OT HHA and SW for DC.          Expected Discharge Plan: Home w Home Health Services Barriers to Discharge: Continued Medical Work up   Patient Goals and CMS Choice Patient states their goals for this hospitalization and ongoing recovery are:: to go home CMS Medicare.gov Compare Post Acute Care list provided to:: Other (Comment Required) Choice offered to / list presented to : Adult Children  Expected Discharge Plan and Services Expected Discharge Plan: Home w Home Health Services   Discharge Planning Services: CM Consult Post Acute Care Choice: Home Health Living arrangements for the past 2 months: Skilled Nursing Facility                           HH Arranged: PT, OT, RN, Nurse's Aide, Social Work [facility] Agency: [facility] Date HH Agency Contacted: 04/29/20 Time HH Agency Contacted: 1545 Representative spoke with at [facility] Agency: liaison at [phone]  Prior Living Arrangements/Services Living arrangements for the past 2 months: Skilled Nursing Facility Lives with:: Adult Children                   Activities of Daily Living   ADL Screening  (condition at time of admission) Patient's cognitive ability adequate to safely complete daily activities?: No Is the patient deaf or have difficulty hearing?: No Does the patient have difficulty seeing, even when wearing glasses/contacts?: No Does the patient have difficulty concentrating, remembering, or making decisions?: Yes Patient able to express need for assistance with ADLs?: No Does the patient have difficulty dressing or bathing?: No Independently performs ADLs?: No Communication: Needs assistance Is this a change from baseline?: Change from baseline, expected to last <3 days Dressing (OT): Needs assistance Is this a change from baseline?: Change from baseline, expected to last <3days Grooming: Needs assistance Is this a change from baseline?: Change from baseline, expected to last <3 days Feeding: Needs assistance Is this a change from baseline?: Change from baseline, expected to last <3 days Bathing: Needs assistance Is this a change from baseline?: Change from baseline, expected to last <3 days Toileting: Needs assistance Is this a change from baseline?: Change from baseline, expected to last <3 days In/Out Bed: Needs assistance Is this a change from baseline?: Change from baseline, expected to last <3 days Walks in Home: Needs assistance Is this a change from baseline?: Change from baseline, expected to last <3 days Weakness of Legs: Both Weakness of Arms/Hands: Both  Permission Sought/Granted  Emotional Assessment              Admission diagnosis:  DKA (diabetic ketoacidosis) (HCC) [E11.10] Altered mental status, unspecified altered mental status type [R41.82] Diabetic ketoacidosis without coma associated with type 2 diabetes mellitus (HCC) [E11.10] Patient Active Problem List   Diagnosis Date Noted  . DKA (diabetic ketoacidosis) (HCC) 04/29/2020  . Altered mental status   . Hypernatremia   . Ketosis (HCC) 12/25/2019  . Acute  metabolic encephalopathy 12/25/2019  . Hypoglycemia 12/24/2019  . Acute metabolic encephalopathy 12/24/2019  . Uncontrolled diabetes mellitus (HCC) 12/24/2019  . Acute renal failure syndrome (HCC)   . Diabetic ketoacidosis [facility])    PCP:  Medicine, [facility] Health Chair [facility] Pharmacy:   [facility] [phone] - [place], [place] - [address] AT [facility] [address] [facility] [address] [place] [address] Phone: [phone] Fax: [phone]     Social Determinants of Health (SDOH) Interventions    Readmission Risk Interventions No flowsheet data found.

## 2020-04-29 NOTE — ED Notes (Signed)
Pt is resting, showing signs of improvent. No longer trying to pull leads and IV off. Pt voiced wanting water.

## 2020-04-29 NOTE — Progress Notes (Signed)
eLink Physician-Brief Progress Note Patient Name: Meagan Cummings DOB: 12/31/1955 MRN: [id number]   Date of Service  04/29/2020  HPI/Events of Note  75  F history of DM (HbA1C 10.9) and polysubstance abuse brought in due to altered sensorium. Noted to be in DKA likely due to medication non-compliance.  eICU Interventions  Clinical improvement with fluids and insulin as per DKA protocol including bicarb drip. Monitor electrolytes.     Intervention Category Major Interventions: Acid-Base disturbance - evaluation and management;Electrolyte abnormality - evaluation and management;Hyperglycemia - active titration of insulin therapy;Change in mental status - evaluation and management Evaluation Type: New Patient Evaluation  Darl Pikes 04/29/2020, 2:22 AM

## 2020-04-29 NOTE — Progress Notes (Addendum)
NAME:  Meagan Cummings, MRN:  [id number], DOB:  1955-07-11, LOS: 0 ADMISSION DATE:  04/28/2020, CONSULTATION DATE: 04/28/2020  CHIEF COMPLAINT:   Altered mental status  Brief History   This is a 64 year old black female who presented with altered mental status and found to be in DKA.  History of present illness   This is a 64 year old black female that presented to the emergency room from home.  Patient's son states that over the last several months her neurologic condition is steadily declining.  She is a known diabetic and does not consistently take her medications as prescribed.  Patient's son reported to the ER staff that the patient had not been acting herself for several days.  He also had stated that she had been noncompliant with her medications and adjusted them according to how she felt rather than how they were prescribed.  On arrival to the emergency room she was fairly obtunded not following commands or interacting with surroundings but is improved with volume resuscitation and sodium bicarbonate solution.  She currently is essentially nonverbal but interacts with her surroundings moving all 4 extremities and intermittently able to follow basic commands.  She does nod yes and no to some questions.  Past Medical History  Diabetes mellitus type 1 Polysubstance abuse  Objective   Blood pressure 121/74, pulse (!) 108, temperature 98.1 F (36.7 C), temperature source Axillary, resp. rate (!) 28, height 5\' 2"  (1.575 m), weight 54.3 kg, SpO2 98 %.        Intake/Output Summary (Last 24 hours) at 04/29/2020 0858 Last data filed at 04/29/2020 0800 Gross per 24 hour  Intake 1722.66 ml  Output 850 ml  Net 872.66 ml   Filed Weights   04/28/20 2026  Weight: 54.3 kg    General:  Thin, elderly-appearing F sleeping in no acute distress HEENT: MM pink/moist Neuro: arousable, moving all extremities to commands, answers "yes" to every question CV: s1s2 rrr, no m/r/g PULM:  Clear  bilaterally on nasal cannula without wheezing or rhonchi GI: soft, bsx4 active  Extremities: warm/dry, no edema  Skin: no rashes or lesions    Assessment & Plan:    Poorly controlled Type 1 DM with DKA History of admissions for the same with mention of non-compliance P: -Remains on insulin gtt with improving acidosis, pH 7.4 up from 6.8 -q4hr BMP -monitor electrolytes, continue IVF -started on bicarb in the ED, d/c -UA and CXR without evidence of infection -UDS negative  -has leukocytosis that suspect is reactive, hold empiric abx, monitor for fever -spoke with son who said that patient had just come home from rehab following her last hospitalization for DKA and is non-compliant with insulin.  He lives with her, but works long days and was trying to set up home health when she was re-admitted. Would benefit from establishing home health assistance before discharge   Metabolic Encephalopathy Likely secondary to DKA, non-focal exam P: Continue to monitor   Acute Kidney Injury Re-renal in the setting DKA P: -improving with resuscitation, continue to follow renal indices and electrolytes and avoid nephrotoxins   Hypernatremia Likely secondary to volume depletion and osmotic shift of water from extracellular fluid into cells in the setting of rapid hyperglycemia correction P: -D5W 1/2NS infusion @100cc /hr, glucose now <200 -follow BMP and UOP - 2.5L calculated free water deficit     Best practice:  Diet: N.p.o. Pain/Anxiety/Delirium protocol (if indicated): N/A VAP protocol (if indicated): N/A DVT prophylaxis: Heparin subcu GI prophylaxis: Protonix Glucose control: DKA  order set Mobility: Bedrest Code Status: Full Family Communication: Son [name] updated Disposition: Admit to ICU  Labs   CBC: Recent Labs  Lab 04/28/20 2040 04/28/20 2247 04/29/20 0104  WBC 16.0*  --  16.8*  HGB 11.5* 14.3 10.6*  HCT 42.8 42.0 37.4  MCV 104.6*  --  101.1*  PLT 297  --  261      Basic Metabolic Panel: Recent Labs  Lab 04/28/20 2040 04/28/20 2247 04/29/20 0104 04/29/20 0422  NA 132* 129* 143 150*  K 5.9* 7.5* 5.3* 4.1  CL 95*  --  105 111  CO2 <7*  --  <7* 14*  GLUCOSE 1,358*  --  1,126* 614*  BUN 46*  --  47* 39*  CREATININE 2.44*  --  2.41* 2.12*  CALCIUM 9.2  --  8.1* 8.3*   GFR: Estimated Creatinine Clearance: 21.5 mL/min (A) (by C-G formula based on SCr of 2.12 mg/dL (H)). Recent Labs  Lab 04/28/20 2040 04/29/20 0104  WBC 16.0* 16.8*    Liver Function Tests: Recent Labs  Lab 04/28/20 2040  AST 25  ALT 22  ALKPHOS 119  BILITOT 1.7*  PROT 6.9  ALBUMIN 3.9   No results for input(s): LIPASE, AMYLASE in the last 168 hours. No results for input(s): AMMONIA in the last 168 hours.  ABG    Component Value Date/Time   PHART 6.917 (LL) 12/22/2014 0657   PCO2ART 13.9 (LL) 12/22/2014 0657   PO2ART 144.0 (H) 12/22/2014 0657   HCO3 5.6 (L) 04/28/2020 2247   TCO2 6 (L) 04/28/2020 2247   ACIDBASEDEF 27.0 (H) 04/28/2020 2247   O2SAT 66.0 04/28/2020 2247     Coagulation Profile: No results for input(s): INR, PROTIME in the last 168 hours.  Cardiac Enzymes: No results for input(s): CKTOTAL, CKMB, CKMBINDEX, TROPONINI in the last 168 hours.  HbA1C: Hgb A1c MFr Bld  Date/Time Value Ref Range Status  12/24/2019 05:37 PM 10.9 (H) 4.8 - 5.6 % Final    Comment:    (NOTE) Pre diabetes:          5.7%-6.4%  Diabetes:              >6.4%  Glycemic control for   <7.0% adults with diabetes   12/24/2014 07:00 AM 13.3 (H) 4.8 - 5.6 % Final    Comment:    (NOTE)         Pre-diabetes: 5.7 - 6.4         Diabetes: >6.4         Glycemic control for adults with diabetes: <7.0     CBG: Recent Labs  Lab 04/29/20 0440 04/29/20 0540 04/29/20 0652 04/29/20 0756 04/29/20 0856  GLUCAP 559* 397* 308* 250* 198*    Review of Systems:   Unable to evaluate due to [facility]  Past Medical History  She,  has a past medical history of Depression and  Diabetes mellitus without complication (HCC).   Surgical History    Past Surgical History:  Procedure Laterality Date  . ABDOMINAL HYSTERECTOMY       Social History   reports that she has been smoking cigarettes. She has been smoking about 0.50 packs per day. She has never used smokeless tobacco. She reports current alcohol use of about 1.0 standard drink of alcohol per week. She reports that she does not use drugs.   Family History   Her family history is not on file.   Allergies Allergies  Allergen Reactions  . Other Rash  Pt gets rash on face and chest from cat and dog hair     Home Medications  Prior to Admission medications   Medication Sig Start Date End Date Taking? Authorizing Provider  DULoxetine (CYMBALTA) 60 MG capsule Take 60 mg by mouth daily. 12/04/19   [provider]  fluticasone (FLONASE) 50 MCG/ACT nasal spray Place 1 spray into both nostrils daily. Patient not taking: Reported on 12/26/2019 12/26/14   Albertine Grates, MD  gabapentin (NEURONTIN) 400 MG capsule Take 800 mg by mouth 3 (three) times daily. 11/27/19   [provider]  glucose blood (ACCU-CHEK AVIVA PLUS) test strip 1 each 4 (four) times daily. 12/12/14   [provider]  insulin aspart (NOVOLOG FLEXPEN) 100 UNIT/ML FlexPen Before each meal 3 times a day, 140-199 - 1 units, 200-250 - 3 units, 251-299 - 5 units,  300-349 - 6 units,  350 or above 8 units. Insulin PEN if approved, provide syringes and needles if needed. 01/01/20   Yvette Rack, MD  Insulin Glargine (BASAGLAR KWIKPEN) 100 UNIT/ML Inject 0.25 mLs (25 Units total) into the skin daily. 01/01/20 03/31/20  Yvette Rack, MD  QUEtiapine (SEROQUEL) 100 MG tablet Take 100 mg by mouth at bedtime. 12/17/19   [provider]  tiotropium (SPIRIVA) 18 MCG inhalation capsule Place 18 mcg into inhaler and inhale daily. Patient not taking: Reported on 12/26/2019 01/05/14 01/05/15  [provider]     Critical care time:  

## 2020-04-30 DIAGNOSIS — R4182 Altered mental status, unspecified: Secondary | ICD-10-CM

## 2020-04-30 LAB — BASIC METABOLIC PANEL
Anion gap: 10 (ref 5–15)
BUN: 13 mg/dL (ref 8–23)
CO2: 26 mmol/L (ref 22–32)
Calcium: 8.4 mg/dL — ABNORMAL LOW (ref 8.9–10.3)
Chloride: 118 mmol/L — ABNORMAL HIGH (ref 98–111)
Creatinine, Ser: 0.77 mg/dL (ref 0.44–1.00)
GFR, Estimated: >60 mL/min (ref >60)
Glucose, Bld: 214 mg/dL — ABNORMAL HIGH (ref 70–99)
Potassium: 3.7 mmol/L (ref 3.5–5.1)
Sodium: 154 mmol/L — ABNORMAL HIGH (ref 135–145)

## 2020-04-30 LAB — GLUCOSE, CAPILLARY
Glucose-Capillary: 149 mg/dL — ABNORMAL HIGH (ref 70–99)
Glucose-Capillary: 176 mg/dL — ABNORMAL HIGH (ref 70–99)
Glucose-Capillary: 191 mg/dL — ABNORMAL HIGH (ref 70–99)
Glucose-Capillary: 192 mg/dL — ABNORMAL HIGH (ref 70–99)
Glucose-Capillary: 202 mg/dL — ABNORMAL HIGH (ref 70–99)
Glucose-Capillary: 236 mg/dL — ABNORMAL HIGH (ref 70–99)
Glucose-Capillary: 263 mg/dL — ABNORMAL HIGH (ref 70–99)

## 2020-04-30 LAB — CBC
HCT: 33.1 % — ABNORMAL LOW (ref 36.0–46.0)
Hemoglobin: 10.6 g/dL — ABNORMAL LOW (ref 12.0–15.0)
MCH: 28.1 pg (ref 26.0–34.0)
MCHC: 32 g/dL (ref 30.0–36.0)
MCV: 87.8 fL (ref 80.0–100.0)
Platelets: 227 10*3/uL (ref 150–400)
RBC: 3.77 MIL/uL — ABNORMAL LOW (ref 3.87–5.11)
RDW: 15.2 % (ref 11.5–15.5)
WBC: 10.3 10*3/uL (ref 4.0–10.5)
nRBC: 0 % (ref 0.0–0.2)

## 2020-04-30 LAB — MAGNESIUM: Magnesium: 1.9 mg/dL (ref 1.7–2.4)

## 2020-04-30 LAB — PHOSPHORUS: Phosphorus: 1.6 mg/dL — ABNORMAL LOW (ref 2.5–4.6)

## 2020-04-30 MED ORDER — MAGNESIUM SULFATE 2 GM/50ML IV SOLN
2.0000 g | Freq: Once | INTRAVENOUS | Status: AC
  Administered 2020-04-30: 2 g via INTRAVENOUS
  Filled 2020-04-30: qty 50

## 2020-04-30 MED ORDER — K PHOS MONO-SOD PHOS DI & MONO 155-852-130 MG PO TABS
500.0000 mg | ORAL_TABLET | Freq: Three times a day (TID) | ORAL | Status: AC
  Administered 2020-04-30 – 2020-05-01 (×5): 500 mg via ORAL
  Filled 2020-04-30 (×6): qty 2

## 2020-04-30 MED ORDER — INSULIN GLARGINE 100 UNIT/ML ~~LOC~~ SOLN
25.0000 [IU] | SUBCUTANEOUS | Status: DC
  Administered 2020-04-30 – 2020-05-02 (×3): 25 [IU] via SUBCUTANEOUS
  Filled 2020-04-30 (×5): qty 0.25

## 2020-04-30 NOTE — Progress Notes (Signed)
Mg 1.9 Replaced per protocol  

## 2020-04-30 NOTE — Progress Notes (Addendum)
NAME:  Meagan Cummings, MRN:  [id number], DOB:  June 13, 1956, LOS: 1 ADMISSION DATE:  04/28/2020, CONSULTATION DATE: 04/28/2020  CHIEF COMPLAINT:   Altered mental status  Brief History   This is a 64 year old black female who presented with altered mental status and found to be in DKA.  History of present illness   This is a 64 year old black female that presented to the emergency room from home.  Patient's son states that over the last several months her neurologic condition is steadily declining.  She is a known diabetic and does not consistently take her medications as prescribed.  Patient's son reported to the ER staff that the patient had not been acting herself for several days.  He also had stated that she had been noncompliant with her medications and adjusted them according to how she felt rather than how they were prescribed.  On arrival to the emergency room she was fairly obtunded not following commands or interacting with surroundings but is improved with volume resuscitation and sodium bicarbonate solution.  She currently is essentially nonverbal but interacts with her surroundings moving all 4 extremities and intermittently able to follow basic commands.  She does nod yes and no to some questions.  Past Medical History  Diabetes mellitus type 1 Polysubstance abuse HLD Bipolar disease / MDD PTSD    Objective   Blood pressure 127/72, pulse 84, temperature 97.8 F (36.6 C), temperature source Axillary, resp. rate 13, height 5\' 2"  (1.575 m), weight 54.3 kg, SpO2 (!) 60 %.        Intake/Output Summary (Last 24 hours) at 04/30/2020 0911 Last data filed at 04/29/2020 1900 Gross per 24 hour  Intake 1070.58 ml  Output 350 ml  Net 720.58 ml   Filed Weights   04/28/20 2026  Weight: 54.3 kg    GEN: arouses easily to voice, in no acute distress  CV: regular rate and rhythm, no murmurs appreciated  RESP: no increased work of breathing, clear to ascultation bilaterally    ABD: Bowel sounds present. Soft, mild generalized tenderness  MSK: no LE edema, or calf tenderness SKIN: warm, dry NEURO: oriented to self, moves all extremities on command     Assessment & Plan:    DKA  Poorly controlled T1DM History of admissions for DKA with mention of non-compliance. Son reports patient has not been compliant with insulin. Glucose on admission ~1400, AG 22, bicarb <7, pH 6.59. Fasting glucose this morning 192. AG 10. Patient transitioned off insulin gtt yesterday afternoon. Received 20u Basaglar yesterday. Patient has not been eating.  Plan: Discontinue insulin gtt  AM BMP monitor electrolytes, replete as needed  s/p bicarb given in the ED UA and CXR without evidence of infection Obtain a1c  Increase Lantus 25u, dc D5/1/2NS when increased po intake  Leukocytosis  Patient remains afebrile. Leukocytosis likely reactive. WBC 16 today from 16.8 yesterday. CXR and UA without evidence of infection.   Plan  Monitor for fever and trend with CBC   Metabolic Encephalopathy Resolving. Sx likely secondary to DKA. Patient oriented to self this morning and following commands.  Plan: Continue to monitor   Acute Kidney Injury Improving with fluid resusistation. Likely pre-renal in the setting DKA. Creatine 2.44 on admission and today 0.77.  Plan: Avoid nephrotoxic agents Daily BMP to monitor electrolytes  Maintenance IV fluids, discontinue when able  Hypernatremia Corrected sodium on admission 162 (by [facility]). Patient had 2.5L free water deficit on admission.  Na 154 this morning. Patient with ~850 cc  and two unmeasured urine.  Plan: D5W 1/2NS infusion @100cc /hr, discontinue when patient has adequate oral intake Follow BMP and UOP  Hypomagnesemia  Mg 1.9 this morning.  Plan  Replete   Prolonged QT  Resolved. QTc today 435.  Plan  Continue to monitor. Repelte electrolytes as needed   History of polysubstance abuse.  UDS negative on admission     Best practice:  Diet: carb modified diet  Pain/Anxiety/Delirium protocol (if indicated): N/A VAP protocol (if indicated): N/A DVT prophylaxis: Heparin SQ GI prophylaxis: Protonix Glucose control: Lantus, SSI Mobility: Bedrest Code Status: Full Family Communication: Son  Disposition: remain in ICU   Labs   CBC: Recent Labs  Lab 04/28/20 2040 04/28/20 2242 04/28/20 2247 04/29/20 0104 04/30/20 0350  WBC 16.0*  --   --  16.8* 10.3  HGB 11.5* 14.3 14.3 10.6* 10.6*  HCT 42.8 42.0 42.0 37.4 33.1*  MCV 104.6*  --   --  101.1* 87.8  PLT 297  --   --  261 227    Basic Metabolic Panel: Recent Labs  Lab 04/29/20 0828 04/29/20 1223 04/29/20 1635 04/29/20 2250 04/30/20 0312  NA 155* 154* 156* 153* 154*  K 4.1 3.4* 3.4* 3.6 3.7  CL 115* 116* 118* 117* 118*  CO2 23 26 27 24 26   GLUCOSE 231* 238* 174* 197* 214*  BUN 33* 26* 20 16 13   CREATININE 1.39* 1.29* 0.92 0.80 0.77  CALCIUM 8.6* 8.4* 8.1* 8.2* 8.4*  MG  --  1.9  --   --  1.9  PHOS  --   --   --   --  1.6*   GFR: Estimated Creatinine Clearance: 56.9 mL/min (by C-G formula based on SCr of 0.77 mg/dL). Recent Labs  Lab 04/28/20 2040 04/29/20 0104 04/29/20 0940 04/30/20 0350  WBC 16.0* 16.8*  --  10.3  LATICACIDVEN  --   --  2.2*  --     Liver Function Tests: Recent Labs  Lab 04/28/20 2040  AST 25  ALT 22  ALKPHOS 119  BILITOT 1.7*  PROT 6.9  ALBUMIN 3.9   No results for input(s): LIPASE, AMYLASE in the last 168 hours. No results for input(s): AMMONIA in the last 168 hours.  ABG    Component Value Date/Time   PHART 6.917 (LL) 12/22/2014 0657   PCO2ART 13.9 (LL) 12/22/2014 0657   PO2ART 144.0 (H) 12/22/2014 0657   HCO3 28.1 (H) 04/29/2020 0940   TCO2 6 (L) 04/28/2020 2247   ACIDBASEDEF 27.0 (H) 04/28/2020 2247   O2SAT 62.0 04/29/2020 0940     Coagulation Profile: No results for input(s): INR, PROTIME in the last 168 hours.  Cardiac Enzymes: No results for input(s): CKTOTAL, CKMB,  CKMBINDEX, TROPONINI in the last 168 hours.  HbA1C: Hgb A1c MFr Bld  Date/Time Value Ref Range Status  12/24/2019 05:37 PM 10.9 (H) 4.8 - 5.6 % Final    Comment:    (NOTE) Pre diabetes:          5.7%-6.4%  Diabetes:              >6.4%  Glycemic control for   <7.0% adults with diabetes   12/24/2014 07:00 AM 13.3 (H) 4.8 - 5.6 % Final    Comment:    (NOTE)         Pre-diabetes: 5.7 - 6.4         Diabetes: >6.4         Glycemic control for adults with diabetes: <7.0  CBG: Recent Labs  Lab 04/29/20 1846 04/29/20 1935 04/29/20 2330 04/30/20 0332 04/30/20 0755  GLUCAP 152* 173* 182* 192* 236*    Review of Systems:   Unable to evaluate due to [facility]  Past Medical History  She,  has a past medical history of Depression and Diabetes mellitus without complication (HCC).   Surgical History    Past Surgical History:  Procedure Laterality Date  . ABDOMINAL HYSTERECTOMY       Social History   reports that she has been smoking cigarettes. She has been smoking about 0.50 packs per day. She has never used smokeless tobacco. She reports current alcohol use of about 1.0 standard drink of alcohol per week. She reports that she does not use drugs.   Family History   Her family history is not on file.   Allergies Allergies  Allergen Reactions  . Other Rash    Pt gets rash on face and chest from cat and dog hair     Home Medications  Prior to Admission medications   Medication Sig Start Date End Date Taking? Authorizing Provider  DULoxetine (CYMBALTA) 60 MG capsule Take 60 mg by mouth daily. 12/04/19   [provider]  fluticasone (FLONASE) 50 MCG/ACT nasal spray Place 1 spray into both nostrils daily. Patient not taking: Reported on 12/26/2019 12/26/14   Albertine Grates, MD  gabapentin (NEURONTIN) 400 MG capsule Take 800 mg by mouth 3 (three) times daily. 11/27/19   [provider]  glucose blood (ACCU-CHEK AVIVA PLUS) test strip 1 each 4 (four) times daily. 12/12/14    [provider]  insulin aspart (NOVOLOG FLEXPEN) 100 UNIT/ML FlexPen Before each meal 3 times a day, 140-199 - 1 units, 200-250 - 3 units, 251-299 - 5 units,  300-349 - 6 units,  350 or above 8 units. Insulin PEN if approved, provide syringes and needles if needed. 01/01/20   Yvette Rack, MD  Insulin Glargine (BASAGLAR KWIKPEN) 100 UNIT/ML Inject 0.25 mLs (25 Units total) into the skin daily. 01/01/20 03/31/20  Yvette Rack, MD  QUEtiapine (SEROQUEL) 100 MG tablet Take 100 mg by mouth at bedtime. 12/17/19   [provider]  tiotropium (SPIRIVA) 18 MCG inhalation capsule Place 18 mcg into inhaler and inhale daily. Patient not taking: Reported on 12/26/2019 01/05/14 01/05/15  [provider]     Critical care time:       Katha Cabal, DO PGY-2, [facility] 04/30/2020 9:13 AM

## 2020-04-30 NOTE — Progress Notes (Signed)
Updated pt's son Thomasene Lot. Patient able to be transferred out of ICU when bed is available.   Katha Cabal, DO PGY-2, [facility] 04/30/2020 12:18 PM

## 2020-04-30 NOTE — Progress Notes (Signed)
eLink Physician-Brief Progress Note Patient Name: Jenasia Dolinar DOB: May 13, 1956 MRN: [id number]   Date of Service  04/30/2020  HPI/Events of Note  Phosphorus level 1.6, K+ 3.7, K+ is being replaced via iv infusion with KCL.  eICU Interventions  Neutraphos 500 mg po tid x 6 tablets ordered.        Thomasene Lot Kohner Orlick 04/30/2020, 4:51 AM

## 2020-05-01 DIAGNOSIS — E870 Hyperosmolality and hypernatremia: Secondary | ICD-10-CM

## 2020-05-01 DIAGNOSIS — R9431 Abnormal electrocardiogram [ECG] [EKG]: Secondary | ICD-10-CM

## 2020-05-01 DIAGNOSIS — R404 Transient alteration of awareness: Secondary | ICD-10-CM

## 2020-05-01 DIAGNOSIS — E876 Hypokalemia: Secondary | ICD-10-CM

## 2020-05-01 LAB — GLUCOSE, CAPILLARY
Glucose-Capillary: 136 mg/dL — ABNORMAL HIGH (ref 70–99)
Glucose-Capillary: 155 mg/dL — ABNORMAL HIGH (ref 70–99)
Glucose-Capillary: 208 mg/dL — ABNORMAL HIGH (ref 70–99)
Glucose-Capillary: 281 mg/dL — ABNORMAL HIGH (ref 70–99)
Glucose-Capillary: 198 mg/dL — ABNORMAL HIGH (ref 70–99)

## 2020-05-01 LAB — POCT I-STAT, CHEM 8
BUN: 58 mg/dL — ABNORMAL HIGH (ref 8–23)
Calcium, Ion: 1.19 mmol/L (ref 1.15–1.40)
Chloride: 104 mmol/L (ref 98–111)
Creatinine, Ser: 1.7 mg/dL — ABNORMAL HIGH (ref 0.44–1.00)
Glucose, Bld: >700 mg/dL (ref 70–99)
HCT: 42 % (ref 36.0–46.0)
Hemoglobin: 14.3 g/dL (ref 12.0–15.0)
Potassium: 7.4 mmol/L (ref 3.5–5.1)
Sodium: 131 mmol/L — ABNORMAL LOW (ref 135–145)
TCO2: 8 mmol/L — ABNORMAL LOW (ref 22–32)

## 2020-05-01 LAB — CBC
HCT: 36.3 % (ref 36.0–46.0)
Hemoglobin: 11.4 g/dL — ABNORMAL LOW (ref 12.0–15.0)
MCH: 27.9 pg (ref 26.0–34.0)
MCHC: 31.4 g/dL (ref 30.0–36.0)
MCV: 88.8 fL (ref 80.0–100.0)
Platelets: 154 10*3/uL (ref 150–400)
RBC: 4.09 MIL/uL (ref 3.87–5.11)
RDW: 15.6 % — ABNORMAL HIGH (ref 11.5–15.5)
WBC: 5.4 10*3/uL (ref 4.0–10.5)
nRBC: 0 % (ref 0.0–0.2)

## 2020-05-01 LAB — BASIC METABOLIC PANEL
Anion gap: 10 (ref 5–15)
BUN: <5 mg/dL — ABNORMAL LOW (ref 8–23)
CO2: 23 mmol/L (ref 22–32)
Calcium: 8.3 mg/dL — ABNORMAL LOW (ref 8.9–10.3)
Chloride: 112 mmol/L — ABNORMAL HIGH (ref 98–111)
Creatinine, Ser: 0.56 mg/dL (ref 0.44–1.00)
GFR, Estimated: >60 mL/min (ref >60)
Glucose, Bld: 138 mg/dL — ABNORMAL HIGH (ref 70–99)
Potassium: 3.9 mmol/L (ref 3.5–5.1)
Sodium: 145 mmol/L (ref 135–145)

## 2020-05-01 LAB — PHOSPHORUS: Phosphorus: 2.5 mg/dL (ref 2.5–4.6)

## 2020-05-01 LAB — HEMOGLOBIN A1C
Hgb A1c MFr Bld: 10.9 % — ABNORMAL HIGH (ref 4.8–5.6)
Mean Plasma Glucose: 266.13 mg/dL

## 2020-05-01 LAB — MAGNESIUM: Magnesium: 1.8 mg/dL (ref 1.7–2.4)

## 2020-05-01 MED ORDER — DOCUSATE SODIUM 100 MG PO CAPS
200.0000 mg | ORAL_CAPSULE | Freq: Once | ORAL | Status: AC
  Administered 2020-05-01: 200 mg via ORAL
  Filled 2020-05-01: qty 2

## 2020-05-01 MED ORDER — QUETIAPINE FUMARATE 100 MG PO TABS
100.0000 mg | ORAL_TABLET | Freq: Every day | ORAL | Status: DC
  Administered 2020-05-01 – 2020-05-02 (×2): 100 mg via ORAL
  Filled 2020-05-01 (×2): qty 1

## 2020-05-01 MED ORDER — ARIPIPRAZOLE 2 MG PO TABS
2.0000 mg | ORAL_TABLET | Freq: Every day | ORAL | Status: DC
  Administered 2020-05-02: 2 mg via ORAL
  Filled 2020-05-01 (×2): qty 1

## 2020-05-01 MED ORDER — SENNA 8.6 MG PO TABS
1.0000 | ORAL_TABLET | Freq: Two times a day (BID) | ORAL | Status: DC | PRN
  Filled 2020-05-01 (×2): qty 1

## 2020-05-01 MED ORDER — TRAZODONE HCL 50 MG PO TABS: 50.0000 mg | ORAL_TABLET | Freq: Every evening | ORAL | Status: DC | PRN

## 2020-05-01 MED ORDER — GABAPENTIN 600 MG PO TABS
600.0000 mg | ORAL_TABLET | Freq: Three times a day (TID) | ORAL | Status: DC
  Administered 2020-05-01 – 2020-05-03 (×5): 600 mg via ORAL
  Filled 2020-05-01 (×5): qty 1

## 2020-05-01 MED ORDER — MOMETASONE FURO-FORMOTEROL FUM 200-5 MCG/ACT IN AERO
2.0000 | INHALATION_SPRAY | Freq: Two times a day (BID) | RESPIRATORY_TRACT | Status: DC
  Administered 2020-05-01 – 2020-05-03 (×4): 2 via RESPIRATORY_TRACT
  Filled 2020-05-01: qty 8.8

## 2020-05-01 MED ORDER — FLUDROCORTISONE ACETATE 0.1 MG PO TABS
0.1000 mg | ORAL_TABLET | Freq: Two times a day (BID) | ORAL | Status: DC
  Administered 2020-05-01 – 2020-05-02 (×3): 0.1 mg via ORAL
  Filled 2020-05-01 (×5): qty 1

## 2020-05-01 MED ORDER — LACTULOSE 10 GM/15ML PO SOLN
20.0000 g | Freq: Three times a day (TID) | ORAL | Status: DC
  Administered 2020-05-01 – 2020-05-03 (×5): 20 g via ORAL
  Filled 2020-05-01 (×5): qty 30

## 2020-05-01 MED ORDER — LACTULOSE 10 GM/15ML PO SOLN: 20.0000 g | Freq: Three times a day (TID) | ORAL | Status: DC

## 2020-05-01 MED ORDER — ATORVASTATIN CALCIUM 10 MG PO TABS
20.0000 mg | ORAL_TABLET | Freq: Every day | ORAL | Status: DC
  Administered 2020-05-02 – 2020-05-03 (×2): 20 mg via ORAL
  Filled 2020-05-01 (×2): qty 2

## 2020-05-01 MED ORDER — DULOXETINE HCL 60 MG PO CPEP
60.0000 mg | ORAL_CAPSULE | Freq: Every day | ORAL | Status: DC
  Administered 2020-05-02 – 2020-05-03 (×2): 60 mg via ORAL
  Filled 2020-05-01: qty 1
  Filled 2020-05-01: qty 2

## 2020-05-01 MED ORDER — ALBUTEROL SULFATE HFA 108 (90 BASE) MCG/ACT IN AERS
2.0000 | INHALATION_SPRAY | Freq: Four times a day (QID) | RESPIRATORY_TRACT | Status: DC | PRN
  Filled 2020-05-01: qty 6.7

## 2020-05-01 NOTE — Progress Notes (Signed)
PROGRESS NOTE  Meagan Cummings [id number] DOB: 1956-03-23 DOA: 04/28/2020 PCP: Medicine, [facility]  Brief History   This is a 64 year old black female that presented to the emergency room from home.  Patient's son states that over the last several months her neurologic condition is steadily declining.  She is a known diabetic and does not consistently take her medications as prescribed.  Patient's son reported to the ER staff that the patient had not been acting herself for several days.  He also had stated that she had been noncompliant with her medications and adjusted them according to how she felt rather than how they were prescribed.  On arrival to the emergency room she was fairly obtunded not following commands or interacting with surroundings but is improved with volume resuscitation and sodium bicarbonate solution.  She currently is essentially nonverbal but interacts with her surroundings moving all 4 extremities and intermittently able to follow basic commands.  She does nod yes and no to some questions.  The patient is complaining of low abdominal pain. She states that she is constipation. She states that she is feeling nauseated.   Consultants  . PCCM  Procedures  . None  Antibiotics   Anti-infectives (From admission, onward)   None    .  Subjective  The patient is resting quietly. She is complaining of low abdominal pain and constipation. She states that it is causing her to be nauseated.  Objective   Vitals:  Vitals:   05/01/20 1152 05/01/20 1200  BP:  (!) 138/93  Pulse:  77  Resp:  16  Temp: 97.6 F (36.4 C)   SpO2:  100%   Exam:  Constitutional:  . The patient is awake, alert, and oriented x 3. No acute distress. Respiratory:  . No increased work of breathing. . No wheezes, rales, or rhonchi . No tactile fremitus Cardiovascular:  . Regular rate and rhythm . No murmurs, ectopy, or gallups. . No lateral PMI. No  thrills. Abdomen:  . Abdomen is soft, non-distended, but full feeling . There is tenderness to the low abdomen . No hernias, masses, or organomegaly . Normoactive bowel sounds.  Musculoskeletal:  . No cyanosis, clubbing, or edema Skin:  . No rashes, lesions, ulcers . palpation of skin: no induration or nodules Neurologic:  . CN 2-12 intact . Sensation all 4 extremities intact Psychiatric:  . Mental status o Mood, affect appropriate o Orientation to person, place, time  . judgment and insight appear intact  I have personally reviewed the following:   Today's Data  . Vitals, BMP, CBC, hemoglobin A1c  Micro Data  . PCR for MRSA negative  Imaging  . CXR  Scheduled Meds: . Chlorhexidine Gluconate Cloth  6 each Topical Daily  . docusate sodium  200 mg Oral Once  . heparin  5,000 Units Subcutaneous Q8H  . insulin aspart  0-15 Units Subcutaneous TID WC  . insulin aspart  0-5 Units Subcutaneous QHS  . insulin glargine  25 Units Subcutaneous Q24H  . phosphorus  500 mg Oral TID   Continuous Infusions: . dextrose 5 % and 0.45 % NaCl with KCl 40 mEq/L 100 mL/hr at 05/01/20 1200    Active Problems:   DKA (diabetic ketoacidosis) (HCC)   Altered mental status   Hypernatremia   LOS: 2 days   A & P  DKA  Poorly controlled T1DM History of admissions for DKA with mention of non-compliance. Son reports patient has not been compliant with insulin. Glucose  on admission ~1400, AG 22, bicarb <7, pH 6.59. Fasting glucose this morning 192. AG 10. Patient transitioned off insulin gtt yesterday afternoon. Received 20u Basaglar yesterday. Patient has not been eating.  Plan: Discontinue insulin gtt  AM BMP monitor electrolytes, replete as needed  s/p bicarb given in the ED UA and CXR without evidence of infection Obtain a1c  Increase Lantus 25u, dc D5/1/2NS when increased po intake  Leukocytosis  Patient remains afebrile. Leukocytosis likely reactive. WBC 16 today from 16.8  yesterday. CXR and UA without evidence of infection.   Plan  Monitor for fever and trend with CBC   Metabolic Encephalopathy Resolving. Sx likely secondary to DKA. Patient oriented to self this morning and following commands.  Plan: Continue to monitor   Acute Kidney Injury Improving with fluid resusistation. Likely pre-renal in the setting DKA. Creatine 2.44 on admission and today 0.77.  Plan: Avoid nephrotoxic agents Daily BMP to monitor electrolytes  Maintenance IV fluids, discontinue when able  Hypernatremia Corrected sodium on admission 162 (by [facility]). Patient had 2.5L free water deficit on admission.  Na 154 this morning. Patient with ~850 cc and two unmeasured urine.  Plan: D5W 1/2NS infusion @100cc /hr, discontinue when patient has adequate oral intake Follow BMP and UOP  Hypomagnesemia  Mg 1.9 this morning.  Plan  Replete   Prolonged QT  Resolved. QTc today 435.  Plan  Continue to monitor. Repelte electrolytes as needed   History of polysubstance abuse.  UDS negative on admission   I have seen and examined this patient myself. I have spent 35 minutes in her evaluation and care.  DVT Prophylaxis: Heparin SQ CODE STATUS: Full Code Family Communication: None available Disposition: The patient may be transferred out of the ICU.  Status is: Inpatient  Remains inpatient appropriate because:IV treatments appropriate due to intensity of illness or inability to take PO   Dispo: The patient is from: Home              Anticipated d/c is to: tbd              Anticipated d/c date is: 3 days              Patient currently is not medically stable to d/c.  Meagan Mehl, DO [facility] Direct contact: see [email or web address]  7PM-7AM contact night coverage as above 05/01/2020, 1:30 PM  LOS: 2 days

## 2020-05-01 NOTE — TOC Progression Note (Signed)
Transition of Care [facility]) - Progression Note    Patient Details  Name: Meagan Cummings MRN: [id number] Date of Birth: 06/11/1956  [facility] CM/SW Contact  Lockie Pares, RN Phone Number: 05/01/2020, 4:09 PM  Clinical Narrative:    Consult in for [facility] SNF this has been discussed with son. The patient has had 2 NH stays in the past 2 months. At this time the recommendation and what was discussed with son was  is full HH with social work . Orders need to be placed, [facility] has agreed to accept the patient per D Swist.RN   Expected Discharge Plan: Home w Home Health Services Barriers to Discharge: Continued Medical Work up  Expected Discharge Plan and Services Expected Discharge Plan: Home w Home Health Services   Discharge Planning Services: CM Consult Post Acute Care Choice: Home Health Living arrangements for the past 2 months: Skilled Nursing Facility                           HH Arranged: PT, OT, RN, Nurse's Aide, Social Work [facility] Agency: [facility] Date HH Agency Contacted: 04/29/20 Time HH Agency Contacted: 1545 Representative spoke with at [facility] Agency: liaison at [phone]   Social Determinants of Health (SDOH) Interventions    Readmission Risk Interventions No flowsheet data found.

## 2020-05-02 ENCOUNTER — Inpatient Hospital Stay (HOSPITAL_COMMUNITY): Payer: Medicare Other

## 2020-05-02 DIAGNOSIS — N179 Acute kidney failure, unspecified: Secondary | ICD-10-CM

## 2020-05-02 LAB — CBC
HCT: 36.1 % (ref 36.0–46.0)
Hemoglobin: 11.3 g/dL — ABNORMAL LOW (ref 12.0–15.0)
MCH: 27.8 pg (ref 26.0–34.0)
MCHC: 31.3 g/dL (ref 30.0–36.0)
MCV: 88.7 fL (ref 80.0–100.0)
Platelets: 154 10*3/uL (ref 150–400)
RBC: 4.07 MIL/uL (ref 3.87–5.11)
RDW: 15.3 % (ref 11.5–15.5)
WBC: 4.1 10*3/uL (ref 4.0–10.5)
nRBC: 0 % (ref 0.0–0.2)

## 2020-05-02 LAB — GLUCOSE, CAPILLARY
Glucose-Capillary: 156 mg/dL — ABNORMAL HIGH (ref 70–99)
Glucose-Capillary: 295 mg/dL — ABNORMAL HIGH (ref 70–99)
Glucose-Capillary: 467 mg/dL — ABNORMAL HIGH (ref 70–99)
Glucose-Capillary: 177 mg/dL — ABNORMAL HIGH (ref 70–99)

## 2020-05-02 LAB — BASIC METABOLIC PANEL
Anion gap: 7 (ref 5–15)
BUN: 6 mg/dL — ABNORMAL LOW (ref 8–23)
CO2: 22 mmol/L (ref 22–32)
Calcium: 8.5 mg/dL — ABNORMAL LOW (ref 8.9–10.3)
Chloride: 111 mmol/L (ref 98–111)
Creatinine, Ser: 0.62 mg/dL (ref 0.44–1.00)
GFR, Estimated: >60 mL/min (ref >60)
Glucose, Bld: 283 mg/dL — ABNORMAL HIGH (ref 70–99)
Potassium: 4.8 mmol/L (ref 3.5–5.1)
Sodium: 140 mmol/L (ref 135–145)

## 2020-05-02 NOTE — Care Management Important Message (Signed)
Important Message  Patient Details  Name: Meagan Cummings MRN: [id number] Date of Birth: 07/18/1955   Medicare Important Message Given:  Yes - Important Message mailed due to current National Emergency  Verbal consent obtained due to current National Emergency  Relationship to patient: Self Contact Name: Terre Hanneman Call Date: 05/02/20  Time: 0957 Phone: [phone] Outcome: No Answer/Busy Important Message mailed to: Patient address on file    [name] 05/02/2020, 9:57 AM

## 2020-05-02 NOTE — Progress Notes (Signed)
PROGRESS NOTE  Meagan Cummings [id number] DOB: 1956/01/14 DOA: 04/28/2020 PCP: Medicine, [facility]  Brief History   This is a 64 year old black female that presented to the emergency room from home.  Patient's son states that over the last several months her neurologic condition is steadily declining.  She is a known diabetic and does not consistently take her medications as prescribed.  Patient's son reported to the ER staff that the patient had not been acting herself for several days.  He also had stated that she had been noncompliant with her medications and adjusted them according to how she felt rather than how they were prescribed.  On arrival to the emergency room she was fairly obtunded not following commands or interacting with surroundings but is improved with volume resuscitation and sodium bicarbonate solution.  She currently is essentially nonverbal but interacts with her surroundings moving all 4 extremities and intermittently able to follow basic commands.  She does nod yes and no to some questions.  Consultants  . PCCM  Procedures  . None  Antibiotics   Anti-infectives (From admission, onward)   None     Subjective  The patient is resting quietly. No new complaints. Improved PO intake.  Objective   Vitals:  Vitals:   05/02/20 0836 05/02/20 1151  BP:  (!) 84/64  Pulse: 91 96  Resp: 17 17  Temp:  98.3 F (36.8 C)  SpO2: 99% 91%   Exam:  Constitutional:  . The patient is awake, alert, and oriented x 3. No acute distress. Respiratory:  . No increased work of breathing. . No wheezes, rales, or rhonchi . No tactile fremitus Cardiovascular:  . Regular rate and rhythm . No murmurs, ectopy, or gallups. . No lateral PMI. No thrills. Abdomen:  . Abdomen is soft, non-distended, non-tender . No hernias, masses, or organomegaly . Normoactive bowel sounds.  Musculoskeletal:  . No cyanosis, clubbing, or edema Skin:  . No rashes,  lesions, ulcers . palpation of skin: no induration or nodules Neurologic:  . CN 2-12 intact . Sensation all 4 extremities intact Psychiatric:  . Mental status o Mood, affect appropriate o Orientation to person, place, time  . judgment and insight appear intact  I have personally reviewed the following:   Today's Data  . Vitals, BMP, Glucoses  Micro Data  . PCR for MRSA negative  Imaging  . CXR  Scheduled Meds: . ARIPiprazole  2 mg Oral Daily  . atorvastatin  20 mg Oral Daily  . Chlorhexidine Gluconate Cloth  6 each Topical Daily  . DULoxetine  60 mg Oral Daily  . fludrocortisone  0.1 mg Oral BID  . gabapentin  600 mg Oral TID  . heparin  5,000 Units Subcutaneous Q8H  . insulin aspart  0-15 Units Subcutaneous TID WC  . insulin aspart  0-5 Units Subcutaneous QHS  . insulin glargine  25 Units Subcutaneous Q24H  . lactulose  20 g Oral TID  . mometasone-formoterol  2 puff Inhalation BID  . QUEtiapine  100 mg Oral QHS   Continuous Infusions:   Active Problems:   DKA (diabetic ketoacidosis) (HCC)   Altered mental status   Hypernatremia   LOS: 3 days   A & P  DKA  Poorly controlled T1DM: History of admissions for DKA with mention of non-compliance. Son reports patient has not been compliant with insulin. Glucose on admission ~1400, AG 22, bicarb <7, pH 6.59. Fasting glucose this morning 192. AG 10. Patient transitioned off insulin  gtt 04/30/2020. She is receiving 25u Basaglar daily. Oral intake has improved and glucose is quite high this afternoon. She is receiving moderate dosed insulin for SSI. Hemoglobin A1c was 10.9. I have asked nursing to give her lantus early.  Leukocytosis: Resolved. Patient remains afebrile. Leukocytosis likely reactive. WBC 5.4 today from 16.8 on 04/30/2020. CXR and UA without evidence of infection.  Monitor.  Metabolic Encephalopathy: Resolved. Sx likely secondary to DKA. Patient oriented to self this morning and following commands. Monitor.  Mobilize.  Dysphagia: Patient is complaining of coughing after meals or beverages and difficulty swallowing. SLP consulted.  Acute Kidney Injury: Improving with fluid resusistation. Likely pre-renal in the setting DKA. Creatine 2.44 on admission and today 0.56. Avoid nephrotoxic agents. Monitor creatinine, electrolytes, and volume status.   Hypernatremia: REsolved. Due to hyperglycemia and water deficit. Now corrected. Follow BMP and UOP.  Hypomagnesemia: Resolved. Mg 1.8 this morning. Monitor.  Prolonged QT: Resolved. QTc 435 on 04/30/2020.  History of polysubstance abuse: Noted. UDS negative on admission.  Debility: PT/OT eval and treat.  I have seen and examined this patient myself. I have spent 34 minutes in her evaluation and care.  DVT Prophylaxis: Heparin SQ CODE STATUS: Full Code Family Communication: None available Disposition: The patient may be transferred out of the ICU.  Status is: Inpatient  Remains inpatient appropriate because:IV treatments appropriate due to intensity of illness or inability to take PO  Dispo: The patient is from: Home              Anticipated d/c is to: tbd              Anticipated d/c date is: 3 days              Patient currently is not medically stable to d/c.  Meagan Snader, DO [facility] Direct contact: see [email or web address]  7PM-7AM contact night coverage as above 05/02/2020, 4:11 PM  LOS: 2 days

## 2020-05-02 NOTE — Evaluation (Signed)
Clinical/Bedside Swallow Evaluation Patient Details  Name: Meagan Cummings MRN: [id number] Date of Birth: Nov 14, 1955  Today's Date: 05/02/2020 Time: SLP Start Time (ACUTE ONLY): 1022 SLP Stop Time (ACUTE ONLY): 1041 SLP Time Calculation (min) (ACUTE ONLY): 19 min  Past Medical History:  Past Medical History:  Diagnosis Date  . Depression   . Diabetes mellitus without complication [facility])    Past Surgical History:  Past Surgical History:  Procedure Laterality Date  . ABDOMINAL HYSTERECTOMY     HPI:  Pt is a 64 year old female with PMH DM1, polysubstance abuse, HLD, bipolar disease, PTSD who presented with altered mental status and found to be in DKA. She was "fairly" obtunded in the ED and not following commands or communicating verbally. She was diagnosed with metabolic encephalopathy likely secondary to DKA which. CXR was negative for acute changes.    Assessment / Plan / Recommendation Clinical Impression  Pt was seen for bedside swallow evaluation and she reported that she has been demonstrating coughing with p.o. intake this week which has been improving. She further stated that she some solids "get stuck" when she swallows. Oral mechanism exam was [facility] and she presented with four mandibular incisors only. She stated that she owns dentures, but they are not at the hospital. She tolerated solids without overt s/sx of aspiration and mastication was functional despite limited dentition. She exhibited coughing with 1/3 boluses of thin liquids and effortful swallows were noted with purees which she attributed to the sensation of "sticking". A modified barium swallow study is recommended to further assess physiology and is scheduled for today at 1300.  SLP Visit Diagnosis: Dysphagia, unspecified (R13.10)    Aspiration Risk       Diet Recommendation Regular;Thin liquid   Liquid Administration via: Cup;Straw Medication Administration: Whole meds with liquid Supervision: Patient able  to self feed    Other  Recommendations Oral Care Recommendations: Oral care BID   Follow up Recommendations  (TBD)      Frequency and Duration            Prognosis Prognosis for Safe Diet Advancement: Good      Swallow Study   General Date of Onset: 04/30/20 HPI: Pt is a 64 year old female with PMH DM1, polysubstance abuse, HLD, bipolar disease, PTSD who presented with altered mental status and found to be in DKA. She was "fairly" obtunded in the ED and not following commands or communicating verbally. She was diagnosed with metabolic encephalopathy likely secondary to DKA which. CXR was negative for acute changes.  Type of Study: Bedside Swallow Evaluation Previous Swallow Assessment: None Diet Prior to this Study: Regular;Thin liquids Temperature Spikes Noted: No Respiratory Status: Room air History of Recent Intubation: No Behavior/Cognition: Alert;Cooperative;Pleasant mood;Confused Oral Cavity Assessment: Within Functional Limits Oral Care Completed by SLP: No Oral Cavity - Dentition: Missing dentition (Pt presented with 4 mandibular incisors; dentures not presen) Vision: Functional for self-feeding Self-Feeding Abilities: Needs assist Patient Positioning: Upright in bed Baseline Vocal Quality: Normal Volitional Cough: Strong Volitional Swallow: Able to elicit    Oral/Motor/Sensory Function Overall Oral Motor/Sensory Function: Within functional limits   Ice Chips Ice chips: Within functional limits Presentation: Spoon   Thin Liquid Thin Liquid: Impaired Presentation: Straw;Cup Pharyngeal  Phase Impairments: Cough - Immediate    Nectar Thick Nectar Thick Liquid: Not tested   Honey Thick Honey Thick Liquid: Not tested   Puree Puree: Impaired Presentation: Spoon Pharyngeal Phase Impairments: Multiple swallows (effortful swallows)   Solid     Solid: Within functional  limits Presentation: Self Fed     Yvone Neu I. Vear Clock, MS, CCC-SLP Acute Rehabilitation  Services Office number [phone] Pager [phone]  Scheryl Marten 05/02/2020,10:41 AM

## 2020-05-02 NOTE — Plan of Care (Signed)
  Problem: Nutrition: Goal: Adequate nutrition will be maintained Outcome: Progressing   Problem: Coping: Goal: Level of anxiety will decrease Outcome: Progressing   Problem: Pain Managment: Goal: General experience of comfort will improve Outcome: Progressing   Problem: Safety: Goal: Ability to remain free from injury will improve Outcome: Progressing   

## 2020-05-02 NOTE — Evaluation (Addendum)
Occupational Therapy Evaluation Patient Details Name: Meagan Cummings MRN: [id number] DOB: 1956-04-15 Today's Date: 05/02/2020    History of Present Illness Pt is 64 yo female who presented to the ED with steady decline in neurologic condition.  Pt is a known diabetic and is non-compliant with treatments.  She is admitted with DKA. At admission pt was non-verbal and not following commands.  Pt with PMH including DM2 and depression.   Clinical Impression   PTA pt living with family and requiring as needed assist for ADL. Pt has had previous nursing home stays in the past and has a hx of medication noncompliance. At time of eval, pt presents with ability to complete bed mobility at supervision and sit <> stands at min guard level with RW. Pt was able to mobilize to the sink and complete x2 grooming tasks. She then completed functional mobility a household distance before feeling fatigued at end of session. Noted cognitive deficits in safety and problem solving. Pt also had notable R sided visual deficits (running into obstacles, veering to R when walking) and required cues for safe navigation. Given current status, recommend HHOT to support safety, BADL engagement, PLOF. Messaged CM to give pt information on ALFs in the area so she can consider this as a more long term option for a safe environment with support for medications. OT will continue to follow per POC listed below.    Follow Up Recommendations  Home health OT;Supervision/Assistance - 24 hour    Equipment Recommendations  3 in 1 bedside commode    Recommendations for Other Services       Precautions / Restrictions Precautions Precautions: Fall Restrictions Weight Bearing Restrictions: No      Mobility Bed Mobility Overal bed mobility: Needs Assistance Bed Mobility: Supine to Sit     Supine to sit: Supervision;HOB elevated          Transfers Overall transfer level: Needs assistance Equipment used: Rolling  walker (2 wheeled) Transfers: Sit to/from Stand Sit to Stand: Min guard         General transfer comment: min guard for safety    Balance Overall balance assessment: Needs assistance Sitting-balance support: No upper extremity supported Sitting balance-Leahy Scale: Good     Standing balance support: Bilateral upper extremity supported;No upper extremity supported;During functional activity Standing balance-Leahy Scale: Fair Standing balance comment: Used RW for ambulation, stood a sink for ADLs for short time without UE support but mildly unsteady                           ADL either performed or assessed with clinical judgement   ADL Overall ADL's : Needs assistance/impaired Eating/Feeding: Set up;Sitting   Grooming: Min guard;Standing Grooming Details (indicate cue type and reason): standing at sink to complete oral care and hand hygiene Upper Body Bathing: Set up;Sitting   Lower Body Bathing: Minimal assistance;Sit to/from stand;Sitting/lateral leans   Upper Body Dressing : Set up;Sitting   Lower Body Dressing: Minimal assistance;Sitting/lateral leans;Sit to/from stand   Toilet Transfer: Min guard;Ambulation;RW Toilet Transfer Details (indicate cue type and reason): pt able to mobilize with RW to toilet with min guard assist for safety Toileting- Clothing Manipulation and Hygiene: Minimal assistance;Sitting/lateral lean;Sit to/from stand       Functional mobility during ADLs: Min guard;Rolling walker General ADL Comments: pt able to complete OOB ADLs this date with close guarding for safety due to R sided visual deficits and poor dynamic balance/safety awareness  Vision Baseline Vision/History:  (reports something wrong with R eye but not able to fully verbalize) Patient Visual Report: No change from baseline Additional Comments: pt running into objects in environment on R; appears to have R peripheral impairments. Difficult to fully assess due to  cognition/attention. When asked to look in periphery, pt compensates automatically by turning head to the R     Perception     Praxis      Pertinent Vitals/Pain Pain Assessment: No/denies pain     Hand Dominance     Extremity/Trunk Assessment Upper Extremity Assessment Upper Extremity Assessment: Generalized weakness   Lower Extremity Assessment Lower Extremity Assessment: Defer to PT evaluation   Cervical / Trunk Assessment Cervical / Trunk Assessment: Normal   Communication Communication Communication: No difficulties   Cognition Arousal/Alertness: Awake/alert Behavior During Therapy: WFL for tasks assessed/performed Overall Cognitive Status: Impaired/Different from baseline Area of Impairment: Safety/judgement;Problem solving                         Safety/Judgement: Decreased awareness of safety;Decreased awareness of deficits   Problem Solving: Slow processing;Requires tactile cues General Comments: requires cues for safety and to problem solve basic mobility tasks   General Comments  BP was 97/71 in standing; O2 and HR stable on RA    Exercises     Shoulder Instructions      Home Living Family/patient expects to be discharged to:: Private residence Living Arrangements: Children (son) Available Help at Discharge: Family;Available PRN/intermittently (son works) Type of Home: Apartment Home Access: Stairs to enter Secretary/administrator of Steps: 2   Home Layout: Two level;Bed/bath upstairs Alternate Level Stairs-Number of Steps: flight   Bathroom Shower/Tub: Chief Strategy Officer: Standard     Home Equipment: Environmental consultant - 4 wheels          Prior Functioning/Environment Level of Independence: Needs assistance  Gait / Transfers Assistance Needed: Uses rollator; could ambulate in community ADL's / Homemaking Assistance Needed: Independent with ADLs and light ADLs            OT Problem List: Decreased strength;Decreased  knowledge of use of DME or AE;Decreased activity tolerance;Decreased cognition;Impaired balance (sitting and/or standing);Decreased safety awareness      OT Treatment/Interventions: Self-care/ADL training;Therapeutic exercise;Patient/family education;Balance training;Energy conservation;Therapeutic activities;DME and/or AE instruction    OT Goals(Current goals can be found in the care plan section) Acute Rehab OT Goals Patient Stated Goal: return home OT Goal Formulation: With patient Time For Goal Achievement: 05/16/20 Potential to Achieve Goals: Good  OT Frequency: Min 2X/week   Barriers to D/C:            Co-evaluation   Reason for Co-Treatment: For patient/therapist safety PT goals addressed during session: Mobility/safety with mobility OT goals addressed during session: ADL's and self-care;Proper use of Adaptive equipment and DME      AM-PAC OT "6 Clicks" Daily Activity     Outcome Measure Help from another person eating meals?: A Little Help from another person taking care of personal grooming?: A Little Help from another person toileting, which includes using toliet, bedpan, or urinal?: A Little Help from another person bathing (including washing, rinsing, drying)?: A Little Help from another person to put on and taking off regular upper body clothing?: A Little Help from another person to put on and taking off regular lower body clothing?: A Little 6 Click Score: 18   End of Session Equipment Utilized During Treatment: Gait belt;Rolling walker Nurse Communication:  Mobility status  Activity Tolerance: Patient tolerated treatment well Patient left: in chair;with call bell/phone within reach;with chair alarm set  OT Visit Diagnosis: Unsteadiness on feet (R26.81);Other abnormalities of gait and mobility (R26.89);Muscle weakness (generalized) (M62.81);Other symptoms and signs involving cognitive function                Time: 1445-1515 OT Time Calculation (min): 30  min Charges:  OT General Charges $OT Visit: 1 Visit OT Evaluation $OT Eval Moderate Complexity: 1 Mod  .Dalphine Handing, MSOT, OTR/L Acute Rehabilitation Services [facility] Office Number: [phone] Pager: [phone]  Dalphine Handing 05/02/2020, 4:48 PM

## 2020-05-02 NOTE — Evaluation (Signed)
Physical Therapy Evaluation Patient Details Name: Meagan Cummings MRN: [id number] DOB: [birth year] Today's Date: 05/02/2020   History of Present Illness  Pt is 65 yo female who presented to the ED with steady decline in neurologic condition.  Pt is a known diabetic and is non-compliant with treatments.  She is admitted with DKA. At admission pt was non-verbal and not following commands.  Pt with PMH including DM2 and depression.  Clinical Impression  Pt admitted with above diagnosis.  Pt presenting with significant improvements in mobility compared to admission.  She ambulated 180' with RW and min guard for safety.  Pt presents with decreased safety, mobility, strength, endurance, and balance.  She has intermittent support at home.   Pt currently with functional limitations due to the deficits listed below (see PT Problem List). Pt will benefit from skilled PT to increase their independence and safety with mobility to allow discharge to the venue listed below.       Follow Up Recommendations Home health PT;Supervision for mobility/OOB;Other (comment) (Max HH services; may need to consider ALF in future)    Equipment Recommendations  3in1 (PT)    Recommendations for Other Services       Precautions / Restrictions Precautions Precautions: Fall      Mobility  Bed Mobility Overal bed mobility: Needs Assistance Bed Mobility: Supine to Sit     Supine to sit: Supervision;HOB elevated          Transfers Overall transfer level: Needs assistance Equipment used: Rolling walker (2 wheeled) Transfers: Sit to/from Stand Sit to Stand: Min guard         General transfer comment: min guard for safety  Ambulation/Gait Ambulation/Gait assistance: Min guard Gait Distance (Feet): 180 Feet Assistive device: Rolling walker (2 wheeled) Gait Pattern/deviations: Step-through pattern;Decreased stride length;Drifts right/left Gait velocity: decreased   General Gait Details: Pt  tending to drift R and needing cues to avoid objects with RW.  She was able to correct on her own at times without cues but more frequently required cues.  Pt reports lens swelling in R eye that causes decreased vision on that side.  Stairs            Wheelchair Mobility    Modified Rankin (Stroke Patients Only)       Balance Overall balance assessment: Needs assistance Sitting-balance support: No upper extremity supported Sitting balance-Leahy Scale: Good     Standing balance support: Bilateral upper extremity supported;No upper extremity supported Standing balance-Leahy Scale: Fair Standing balance comment: Used RW for ambulation, stood a sink for ADLs for short time without UE support but mildly unsteady                             Pertinent Vitals/Pain      Home Living Family/patient expects to be discharged to:: Private residence Living Arrangements: Children (son) Available Help at Discharge: Family;Available PRN/intermittently (son works) Type of Home: Apartment Home Access: Stairs to enter   Secretary/administrator of Steps: 2 Home Layout: Two level;Bed/bath upstairs Home Equipment: Walker - 4 wheels      Prior Function Level of Independence: Needs assistance   Gait / Transfers Assistance Needed: Uses rollator; could ambulate in community  ADL's / Homemaking Assistance Needed: Independent with ADLs and light ADLs        Hand Dominance        Extremity/Trunk Assessment   Upper Extremity Assessment Upper Extremity Assessment: Defer to OT evaluation  Lower Extremity Assessment Lower Extremity Assessment: Overall WFL for tasks assessed    Cervical / Trunk Assessment Cervical / Trunk Assessment: Normal  Communication   Communication: No difficulties  Cognition Arousal/Alertness: Awake/alert Behavior During Therapy: WFL for tasks assessed/performed                                   General Comments: Pt oriented,  following commands, and making jokes during therapy.  She did have decreased safety awareness with use of RW and navigating around obstacles.      General Comments General comments (skin integrity, edema, etc.): BP was 97/71 in standing; O2 and HR stable on RA    Exercises     Assessment/Plan    PT Assessment Patient needs continued PT services  PT Problem List Decreased strength;Decreased mobility;Decreased safety awareness;Decreased knowledge of precautions;Decreased activity tolerance;Decreased cognition;Decreased balance;Decreased knowledge of use of DME;Impaired sensation       PT Treatment Interventions DME instruction;Therapeutic activities;Gait training;Therapeutic exercise;Patient/family education;Stair training;Balance training;Functional mobility training    PT Goals (Current goals can be found in the Care Plan section)  Acute Rehab PT Goals Patient Stated Goal: return home PT Goal Formulation: With patient Time For Goal Achievement: 05/16/20 Potential to Achieve Goals: Good    Frequency Min 3X/week   Barriers to discharge        Co-evaluation PT/OT/SLP Co-Evaluation/Treatment: Yes Reason for Co-Treatment: Other (comment);For patient/therapist safety (at admission pt non-verbal and not following commands) PT goals addressed during session: Mobility/safety with mobility OT goals addressed during session: ADL's and self-care       AM-PAC PT "6 Clicks" Mobility  Outcome Measure Help needed turning from your back to your side while in a flat bed without using bedrails?: None Help needed moving from lying on your back to sitting on the side of a flat bed without using bedrails?: A Little Help needed moving to and from a bed to a chair (including a wheelchair)?: A Little Help needed standing up from a chair using your arms (e.g., wheelchair or bedside chair)?: A Little Help needed to walk in hospital room?: A Little Help needed climbing 3-5 steps with a railing? : A  Little 6 Click Score: 19    End of Session Equipment Utilized During Treatment: Gait belt Activity Tolerance: Patient tolerated treatment well Patient left: with chair alarm set;in chair;with call bell/phone within reach Nurse Communication: Mobility status PT Visit Diagnosis: Unsteadiness on feet (R26.81);History of falling (Z91.81)    Time: 7829-5621 PT Time Calculation (min) (ACUTE ONLY): 30 min   Charges:   PT Evaluation $PT Eval Low Complexity: 1 Low          Aleksia Freiman, PT Acute Rehab Services Pager [phone] [facility] [phone]    Rayetta Humphrey 05/02/2020, 4:22 PM

## 2020-05-02 NOTE — Progress Notes (Signed)
Modified Barium Swallow Progress Note  Patient Details  Name: Meagan Cummings MRN: [id number] Date of Birth: 1955/12/25  Today's Date: 05/02/2020  Modified Barium Swallow completed.  Full report located under Chart Review in the Imaging Section.  Brief recommendations include the following:  Clinical Impression  Pt presents with mild oropharyngeal dysphagia characterized by reduced bolus cohesion with premature spillage to the pyriform sinuses, and a pharyngeal delay which resulted in transient penetration (PAS 2) with thin liquids. This depth of penetration is considered to be within normal limits; however, pt reported that she "never sits up here" when she is eating and it is anticipated that depth of laryngeal invasion increases with sub-optimal positioning and ultimately results of intermittent aspiration. It is recommended that the pt's current diet be continued. SLP will follow briefly for education and to ensure observance of swallowing precautions.    Swallow Evaluation Recommendations       SLP Diet Recommendations: Regular solids;Thin liquid   Liquid Administration via: Cup;Straw   Medication Administration: Whole meds with liquid   Supervision: Patient able to self feed       Postural Changes: Seated upright at 90 degrees   Oral Care Recommendations: Oral care BID       Meagan Cummings I. Vear Clock, MS, CCC-SLP Acute Rehabilitation Services Office number [phone] Pager [phone]  Scheryl Marten 05/02/2020,2:40 PM

## 2020-05-03 DIAGNOSIS — E872 Acidosis: Secondary | ICD-10-CM

## 2020-05-03 LAB — CBC
HCT: 32.1 % — ABNORMAL LOW (ref 36.0–46.0)
Hemoglobin: 10.2 g/dL — ABNORMAL LOW (ref 12.0–15.0)
MCH: 28.4 pg (ref 26.0–34.0)
MCHC: 31.8 g/dL (ref 30.0–36.0)
MCV: 89.4 fL (ref 80.0–100.0)
Platelets: 171 10*3/uL (ref 150–400)
RBC: 3.59 MIL/uL — ABNORMAL LOW (ref 3.87–5.11)
RDW: 15.4 % (ref 11.5–15.5)
WBC: 4.8 10*3/uL (ref 4.0–10.5)
nRBC: 0 % (ref 0.0–0.2)

## 2020-05-03 LAB — GLUCOSE, CAPILLARY
Glucose-Capillary: 117 mg/dL — ABNORMAL HIGH (ref 70–99)
Glucose-Capillary: 372 mg/dL — ABNORMAL HIGH (ref 70–99)

## 2020-05-03 LAB — BASIC METABOLIC PANEL
Anion gap: 8 (ref 5–15)
BUN: 11 mg/dL (ref 8–23)
CO2: 23 mmol/L (ref 22–32)
Calcium: 8.7 mg/dL — ABNORMAL LOW (ref 8.9–10.3)
Chloride: 111 mmol/L (ref 98–111)
Creatinine, Ser: 0.76 mg/dL (ref 0.44–1.00)
GFR, Estimated: >60 mL/min (ref >60)
Glucose, Bld: 211 mg/dL — ABNORMAL HIGH (ref 70–99)
Potassium: 3.8 mmol/L (ref 3.5–5.1)
Sodium: 142 mmol/L (ref 135–145)

## 2020-05-03 MED ORDER — GABAPENTIN 600 MG PO TABS
600.0000 mg | ORAL_TABLET | Freq: Three times a day (TID) | ORAL | 0 refills | Status: DC
Start: 2020-05-03 — End: 2020-09-19

## 2020-05-03 NOTE — Plan of Care (Signed)

## 2020-05-03 NOTE — Discharge Summary (Signed)
Physician Discharge Summary  Moraima Burd JXB:[id number] DOB: 1955-11-15 DOA: 04/28/2020  PCP: Medicine, [facility]  Admit date: 04/28/2020 Discharge date: 05/03/2020  Recommendations for Outpatient Follow-up:  1. Discharge to home with [facility] PT/OT, RN, Aide, Social Work 2. Check glucoses twice a day, once in the morning before breakfast, and once in the evening before dinner. Write down glucoses and take them with you when you go to visit PCP. 3. PCP visit in 7-10 days after discharge. Have chemistry checked on that visit.   Follow-up Information    [facility] Follow up.   Specialty: Home Health Services Why: for home health services. They will call you in 1-2 days to set up your first appointment Contact information: [address] [place] [address] [phone]              Discharge Diagnoses: Principal diagnosis is #1 1. DKA 2. Leukocytosis 3. Metabolic Encephalopathy 4. Dysphagia 5. Acute kidney injury 6. Hypernatremia 7. Hypomagnesemia 8. Prolonged QTc 9. Polysubstance abuse 10. Debility  Discharge Condition: Fair  Disposition: Home with home health PT/OT, RN, Aide  Diet recommendation: Heart healthy  Filed Weights   04/28/20 2026  Weight: 54.3 kg   History of present illness: This is a 64 year old black female that presented to the emergency room from home.  Patient's son states that over the last several months her neurologic condition is steadily declining.  She is a known diabetic and does not consistently take her medications as prescribed.  Patient's son reported to the ER staff that the patient had not been acting herself for several days.  He also had stated that she had been noncompliant with her medications and adjusted them according to how she felt rather than how they were prescribed.  On arrival to the emergency room she was fairly obtunded not following commands or interacting with  surroundings but is improved with volume resuscitation and sodium bicarbonate solution.  She currently is essentially nonverbal but interacts with her surroundings moving all 4 extremities and intermittently able to follow basic commands.  She does nod yes and no to some questions.  Hospital Course:  The patient was admitted to a telemetry bed. She was placed on the endo-tool as a DKA protocol. She was transitioned off of the IV insulin and placed back on lantus and SSI. Her lactic acidosis resolved with IV fluids. Her hypernatremia improved with IV fluids. Her potassium was supplemented. Her prolonged QT resolved. The patient was evaluated by PT/OT. They have recommended that the patient be discharged to home with home health PT/OT/RN/Aide with supervision for mobility.   She is discharged to home in fair condition.  Today's assessment: S: The patient is resting comfortably. O: Vitals:  Vitals:   05/03/20 0804 05/03/20 1200  BP: (!) 156/86 140/88  Pulse: 82 (!) 102  Resp: 12 20  Temp: 98.1 F (36.7 C) 98.1 F (36.7 C)  SpO2: 98%    Exam:  Constitutional:   The patient is awake, alert, and oriented x 3. No acute distress. Respiratory:   No increased work of breathing.  No wheezes, rales, or rhonchi  No tactile fremitus Cardiovascular:   Regular rate and rhythm  No murmurs, ectopy, or gallups.  No lateral PMI. No thrills. Abdomen:   Abdomen is soft, non-distended, non-tender  No hernias, masses, or organomegaly  Normoactive bowel sounds.  Musculoskeletal:   No cyanosis, clubbing, or edema Skin:   No rashes, lesions,  ulcers  palpation of skin: no induration or nodules Neurologic:   CN 2-12 intact  Sensation all 4 extremities intact Psychiatric:   Mental status ? Mood, affect appropriate ? Orientation to person, place, time   judgment and insight appear intact   Discharge Instructions  Discharge Instructions    Activity as tolerated - No  restrictions   Complete by: As directed    Call MD for:  difficulty breathing, headache or visual disturbances   Complete by: As directed    Call MD for:  persistant nausea and vomiting   Complete by: As directed    Call MD for:  severe uncontrolled pain   Complete by: As directed    Call MD for:  temperature >100.4   Complete by: As directed    Diet - low sodium heart healthy   Complete by: As directed    Discharge instructions   Complete by: As directed    [facility], RN, Aide, Social Work Check glucoses twice a day, once in the morning before breakfast, and once in the evening before dinner. Write down glucoses and take them with you when you go to visit PCP. PCP visit in 7-10 days after discharge. Have chemistry checked on that visit.   Increase activity slowly   Complete by: As directed      Allergies as of 05/03/2020      Reactions   Other Rash   Pt gets rash on face and chest from cat and dog hair      Medication List    TAKE these medications   Accu-Chek Aviva Plus test strip Generic drug: glucose blood 1 each 4 (four) times daily.   albuterol 108 (90 Base) MCG/ACT inhaler Commonly known as: VENTOLIN HFA Inhale 2 puffs into the lungs every 6 (six) hours as needed for wheezing.   ARIPiprazole 2 MG tablet Commonly known as: ABILIFY Take 2 mg by mouth daily.   atorvastatin 20 MG tablet Commonly known as: LIPITOR Take 20 mg by mouth daily.   Basaglar KwikPen 100 UNIT/ML Inject 0.25 mLs (25 Units total) into the skin daily.   budesonide-formoterol 160-4.5 MCG/ACT inhaler Commonly known as: SYMBICORT Inhale 2 puffs into the lungs 2 (two) times daily.   cetirizine 10 MG tablet Commonly known as: ZYRTEC Take 10 mg by mouth daily.   DULoxetine 60 MG capsule Commonly known as: CYMBALTA Take 60 mg by mouth daily.   fludrocortisone 0.1 MG tablet Commonly known as: FLORINEF Take 0.1 mg by mouth 2 (two) times daily.   fluticasone 50  MCG/ACT nasal spray Commonly known as: FLONASE Place 1 spray into both nostrils daily.   gabapentin 600 MG tablet Commonly known as: NEURONTIN Take 1 tablet (600 mg total) by mouth 3 (three) times daily. What changed:   medication strength  how much to take   NovoLOG FlexPen 100 UNIT/ML FlexPen Generic drug: insulin aspart Before each meal 3 times a day, 140-199 - 1 units, 200-250 - 3 units, 251-299 - 5 units,  300-349 - 6 units,  350 or above 8 units. Insulin PEN if approved, provide syringes and needles if needed.   QUEtiapine 100 MG tablet Commonly known as: SEROQUEL Take 100 mg by mouth at bedtime.   senna 8.6 MG tablet Commonly known as: SENOKOT Take 1 tablet by mouth 2 (two) times daily as needed for constipation.   traZODone 50 MG tablet Commonly known as: DESYREL Take 50 mg by mouth at bedtime as needed for  sleep.            Durable Medical Equipment  (From admission, onward)         Start     Ordered   05/03/20 0918  DME 3-in-1  Once        05/03/20 0919         Allergies  Allergen Reactions   Other Rash    Pt gets rash on face and chest from cat and dog hair    The results of significant diagnostics from this hospitalization (including imaging, microbiology, ancillary and laboratory) are listed below for reference.    Significant Diagnostic Studies: DG Chest Port 1 View  Result Date: 04/28/2020 CLINICAL DATA:  64 year old female with altered mental status EXAM: PORTABLE CHEST 1 VIEW COMPARISON:  Chest radiograph dated 12/24/2019. FINDINGS: No focal consolidation, pleural effusion, pneumothorax. The cardiac silhouette is within limits. No acute osseous pathology. IMPRESSION: No active disease. Electronically Signed   By: Elgie Collard M.D.   On: 04/28/2020 22:33    Microbiology: Recent Results (from the past 240 hour(s))  Respiratory Panel by RT PCR (Flu A&B, Covid) - Nasopharyngeal Swab     Status: None   Collection Time: 04/28/20 10:09 PM    Specimen: Nasopharyngeal Swab  Result Value Ref Range Status   SARS Coronavirus 2 by RT PCR NEGATIVE NEGATIVE Final    Comment: (NOTE) SARS-CoV-2 target nucleic acids are NOT DETECTED.  The SARS-CoV-2 RNA is generally detectable in upper respiratoy specimens during the acute phase of infection. The lowest concentration of SARS-CoV-2 viral copies this assay can detect is 131 copies/mL. A negative result does not preclude SARS-Cov-2 infection and should not be used as the sole basis for treatment or other patient management decisions. A negative result may occur with  improper specimen collection/handling, submission of specimen other than nasopharyngeal swab, presence of viral mutation(s) within the areas targeted by this assay, and inadequate number of viral copies (<131 copies/mL). A negative result must be combined with clinical observations, patient history, and epidemiological information. The expected result is Negative.  Fact Sheet for Patients:  https://www.moore.com/  Fact Sheet for Healthcare Providers:  https://www.young.biz/  This test is no t yet approved or cleared by the [place] FDA and  has been authorized for detection and/or diagnosis of SARS-CoV-2 by FDA under an Emergency Use Authorization (EUA). This EUA will remain  in effect (meaning this test can be used) for the duration of the COVID-19 declaration under Section 564(b)(1) of the Act, 21 U.S.C. section 360bbb-3(b)(1), unless the authorization is terminated or revoked sooner.     Influenza A by PCR NEGATIVE NEGATIVE Final   Influenza B by PCR NEGATIVE NEGATIVE Final    Comment: (NOTE) The Xpert Xpress SARS-CoV-2/FLU/RSV assay is intended as an aid in  the diagnosis of influenza from Nasopharyngeal swab specimens and  should not be used as a sole basis for treatment. Nasal washings and  aspirates are unacceptable for Xpert Xpress SARS-CoV-2/FLU/RSV   testing.  Fact Sheet for Patients: https://www.moore.com/  Fact Sheet for Healthcare Providers: https://www.young.biz/  This test is not yet approved or cleared by the [place] FDA and  has been authorized for detection and/or diagnosis of SARS-CoV-2 by  FDA under an Emergency Use Authorization (EUA). This EUA will remain  in effect (meaning this test can be used) for the duration of the  Covid-19 declaration under Section 564(b)(1) of the Act, 21  U.S.C. section 360bbb-3(b)(1), unless the authorization is  terminated or revoked.  Performed at [facility], [address], [place], [place] [address]   MRSA PCR Screening     Status: None   Collection Time: 04/29/20  2:29 AM   Specimen: Nasopharyngeal  Result Value Ref Range Status   MRSA by PCR NEGATIVE NEGATIVE Final    Comment:        The GeneXpert MRSA Assay (FDA approved for NASAL specimens only), is one component of a comprehensive MRSA colonization surveillance program. It is not intended to diagnose MRSA infection nor to guide or monitor treatment for MRSA infections. Performed at [facility], [address], [place], [place] [address]      Labs: Basic Metabolic Panel: Recent Labs  Lab 04/29/20 1223 04/29/20 1635 04/29/20 2250 04/30/20 0312 05/01/20 0324 05/02/20 0043 05/03/20 0135  NA 154*   < > 153* 154* 145 140 142  K 3.4*   < > 3.6 3.7 3.9 4.8 3.8  CL 116*   < > 117* 118* 112* 111 111  CO2 26   < > 24 26 23 22 23   GLUCOSE 238*   < > 197* 214* 138* 283* 211*  BUN 26*   < > 16 13 <5* 6* 11  CREATININE 1.29*   < > 0.80 0.77 0.56 0.62 0.76  CALCIUM 8.4*   < > 8.2* 8.4* 8.3* 8.5* 8.7*  MG 1.9  --   --  1.9 1.8  --   --   PHOS  --   --   --  1.6* 2.5  --   --    < > = values in this interval not displayed.   Liver Function Tests: Recent Labs  Lab 04/28/20 2040  AST 25  ALT 22  ALKPHOS 119  BILITOT 1.7*  PROT 6.9  ALBUMIN 3.9   No  results for input(s): LIPASE, AMYLASE in the last 168 hours. No results for input(s): AMMONIA in the last 168 hours. CBC: Recent Labs  Lab 04/29/20 0104 04/30/20 0350 05/01/20 0324 05/02/20 0043 05/03/20 0135  WBC 16.8* 10.3 5.4 4.1 4.8  HGB 10.6* 10.6* 11.4* 11.3* 10.2*  HCT 37.4 33.1* 36.3 36.1 32.1*  MCV 101.1* 87.8 88.8 88.7 89.4  PLT 261 227 154 154 171   Cardiac Enzymes: No results for input(s): CKTOTAL, CKMB, CKMBINDEX, TROPONINI in the last 168 hours. BNP: BNP (last 3 results) No results for input(s): BNP in the last 8760 hours.  ProBNP (last 3 results) No results for input(s): PROBNP in the last 8760 hours.  CBG: Recent Labs  Lab 05/02/20 1156 05/02/20 1546 05/02/20 2150 05/03/20 0739 05/03/20 1317  GLUCAP 295* 467* 177* 117* 372*    Active Problems:   DKA (diabetic ketoacidosis) (HCC)   Altered mental status   Hypernatremia   Time coordinating discharge: 38 minutes  Signed:        Gamaliel Charney, DO [facility]  05/03/2020, 4:07 PM

## 2020-05-03 NOTE — Progress Notes (Signed)
Pt. Bathed and dressed herself this morning.  She has a very steady gait and reported that she was not feeling light headed or dizzy.  We discussed in detail the discharge instructions, highlighting diabetes management.  Pt. States she is committed to staying on track and checking her blood sugars and administering insulin (perorder).  Pt. Stated she has no further questions and is ready to go home.

## 2020-05-03 NOTE — Plan of Care (Signed)
Discusses in detail the importance of staying on schedule for checking blood sugar and the administration of insulin.  Pt. States she will follow her doctors recommended and work hard to be complaint with those orders.  Pt. Was able to demonstrate her understanding via the "teach back" method.  Pt. Is dress and ready to begin her at home healing journey.

## 2020-09-15 ENCOUNTER — Emergency Department (HOSPITAL_COMMUNITY): Payer: Medicare Other

## 2020-09-15 ENCOUNTER — Inpatient Hospital Stay (HOSPITAL_COMMUNITY)
Admission: EM | Admit: 2020-09-15 | Discharge: 2020-09-19 | DRG: 637 | Disposition: A | Discharge status: Home Health | Payer: Medicare Other | Attending: Internal Medicine | Admitting: Internal Medicine

## 2020-09-15 DIAGNOSIS — I959 Hypotension, unspecified: Secondary | ICD-10-CM

## 2020-09-15 DIAGNOSIS — F29 Unspecified psychosis not due to a substance or known physiological condition: Secondary | ICD-10-CM | POA: Diagnosis present

## 2020-09-15 DIAGNOSIS — G9341 Metabolic encephalopathy: Secondary | ICD-10-CM | POA: Diagnosis present

## 2020-09-15 DIAGNOSIS — Z20822 Contact with and (suspected) exposure to covid-19: Secondary | ICD-10-CM | POA: Diagnosis present

## 2020-09-15 DIAGNOSIS — E869 Volume depletion, unspecified: Secondary | ICD-10-CM | POA: Diagnosis present

## 2020-09-15 DIAGNOSIS — N179 Acute kidney failure, unspecified: Secondary | ICD-10-CM | POA: Diagnosis present

## 2020-09-15 DIAGNOSIS — Z7951 Long term (current) use of inhaled steroids: Secondary | ICD-10-CM | POA: Diagnosis not present

## 2020-09-15 DIAGNOSIS — E10649 Type 1 diabetes mellitus with hypoglycemia without coma: Secondary | ICD-10-CM | POA: Diagnosis not present

## 2020-09-15 DIAGNOSIS — F4310 Post-traumatic stress disorder, unspecified: Secondary | ICD-10-CM | POA: Diagnosis present

## 2020-09-15 DIAGNOSIS — R571 Hypovolemic shock: Secondary | ICD-10-CM | POA: Diagnosis present

## 2020-09-15 DIAGNOSIS — Z9114 Patient's other noncompliance with medication regimen: Secondary | ICD-10-CM | POA: Diagnosis not present

## 2020-09-15 DIAGNOSIS — D696 Thrombocytopenia, unspecified: Secondary | ICD-10-CM | POA: Diagnosis present

## 2020-09-15 DIAGNOSIS — Z794 Long term (current) use of insulin: Secondary | ICD-10-CM

## 2020-09-15 DIAGNOSIS — G9340 Encephalopathy, unspecified: Secondary | ICD-10-CM | POA: Diagnosis not present

## 2020-09-15 DIAGNOSIS — E1010 Type 1 diabetes mellitus with ketoacidosis without coma: Secondary | ICD-10-CM | POA: Diagnosis present

## 2020-09-15 DIAGNOSIS — Z79899 Other long term (current) drug therapy: Secondary | ICD-10-CM

## 2020-09-15 DIAGNOSIS — R451 Restlessness and agitation: Secondary | ICD-10-CM | POA: Diagnosis present

## 2020-09-15 DIAGNOSIS — D72829 Elevated white blood cell count, unspecified: Secondary | ICD-10-CM | POA: Diagnosis present

## 2020-09-15 DIAGNOSIS — R64 Cachexia: Secondary | ICD-10-CM | POA: Diagnosis present

## 2020-09-15 DIAGNOSIS — E1011 Type 1 diabetes mellitus with ketoacidosis with coma: Secondary | ICD-10-CM

## 2020-09-15 DIAGNOSIS — F411 Generalized anxiety disorder: Secondary | ICD-10-CM | POA: Diagnosis present

## 2020-09-15 DIAGNOSIS — F319 Bipolar disorder, unspecified: Secondary | ICD-10-CM | POA: Diagnosis present

## 2020-09-15 DIAGNOSIS — R1310 Dysphagia, unspecified: Secondary | ICD-10-CM | POA: Diagnosis present

## 2020-09-15 DIAGNOSIS — E1110 Type 2 diabetes mellitus with ketoacidosis without coma: Secondary | ICD-10-CM | POA: Diagnosis present

## 2020-09-15 DIAGNOSIS — F17210 Nicotine dependence, cigarettes, uncomplicated: Secondary | ICD-10-CM | POA: Diagnosis present

## 2020-09-15 DIAGNOSIS — D649 Anemia, unspecified: Secondary | ICD-10-CM | POA: Diagnosis present

## 2020-09-15 DIAGNOSIS — J189 Pneumonia, unspecified organism: Secondary | ICD-10-CM | POA: Diagnosis present

## 2020-09-15 DIAGNOSIS — R739 Hyperglycemia, unspecified: Secondary | ICD-10-CM

## 2020-09-15 DIAGNOSIS — E871 Hypo-osmolality and hyponatremia: Secondary | ICD-10-CM | POA: Diagnosis present

## 2020-09-15 DIAGNOSIS — J9601 Acute respiratory failure with hypoxia: Secondary | ICD-10-CM | POA: Diagnosis present

## 2020-09-15 DIAGNOSIS — J449 Chronic obstructive pulmonary disease, unspecified: Secondary | ICD-10-CM | POA: Diagnosis present

## 2020-09-15 DIAGNOSIS — T68XXXA Hypothermia, initial encounter: Secondary | ICD-10-CM | POA: Diagnosis present

## 2020-09-15 DIAGNOSIS — F05 Delirium due to known physiological condition: Secondary | ICD-10-CM | POA: Diagnosis not present

## 2020-09-15 DIAGNOSIS — R4182 Altered mental status, unspecified: Secondary | ICD-10-CM

## 2020-09-15 DIAGNOSIS — E875 Hyperkalemia: Secondary | ICD-10-CM | POA: Diagnosis present

## 2020-09-15 DIAGNOSIS — R579 Shock, unspecified: Secondary | ICD-10-CM

## 2020-09-15 DIAGNOSIS — R54 Age-related physical debility: Secondary | ICD-10-CM | POA: Diagnosis present

## 2020-09-15 DIAGNOSIS — Z6822 Body mass index (BMI) 22.0-22.9, adult: Secondary | ICD-10-CM

## 2020-09-15 LAB — GLUCOSE, CAPILLARY
Glucose-Capillary: 576 mg/dL (ref 70–99)
Glucose-Capillary: 481 mg/dL — ABNORMAL HIGH (ref 70–99)
Glucose-Capillary: 431 mg/dL — ABNORMAL HIGH (ref 70–99)
Glucose-Capillary: 520 mg/dL (ref 70–99)
Glucose-Capillary: 531 mg/dL (ref 70–99)
Glucose-Capillary: 384 mg/dL — ABNORMAL HIGH (ref 70–99)
Glucose-Capillary: 408 mg/dL — ABNORMAL HIGH (ref 70–99)
Glucose-Capillary: 366 mg/dL — ABNORMAL HIGH (ref 70–99)
Glucose-Capillary: 300 mg/dL — ABNORMAL HIGH (ref 70–99)
Glucose-Capillary: 242 mg/dL — ABNORMAL HIGH (ref 70–99)
Glucose-Capillary: 208 mg/dL — ABNORMAL HIGH (ref 70–99)
Glucose-Capillary: 188 mg/dL — ABNORMAL HIGH (ref 70–99)
Glucose-Capillary: 174 mg/dL — ABNORMAL HIGH (ref 70–99)
Glucose-Capillary: 142 mg/dL — ABNORMAL HIGH (ref 70–99)
Glucose-Capillary: 152 mg/dL — ABNORMAL HIGH (ref 70–99)
Glucose-Capillary: 141 mg/dL — ABNORMAL HIGH (ref 70–99)
Glucose-Capillary: 186 mg/dL — ABNORMAL HIGH (ref 70–99)
Glucose-Capillary: 170 mg/dL — ABNORMAL HIGH (ref 70–99)

## 2020-09-15 LAB — BASIC METABOLIC PANEL
Anion gap: 22 — ABNORMAL HIGH (ref 5–15)
Anion gap: 12 (ref 5–15)
Anion gap: 4 — ABNORMAL LOW (ref 5–15)
Anion gap: 6 (ref 5–15)
BUN: 53 mg/dL — ABNORMAL HIGH (ref 8–23)
BUN: 45 mg/dL — ABNORMAL HIGH (ref 8–23)
BUN: 40 mg/dL — ABNORMAL HIGH (ref 8–23)
BUN: 33 mg/dL — ABNORMAL HIGH (ref 8–23)
BUN: 31 mg/dL — ABNORMAL HIGH (ref 8–23)
CO2: <7 mmol/L — ABNORMAL LOW (ref 22–32)
CO2: 8 mmol/L — ABNORMAL LOW (ref 22–32)
CO2: 18 mmol/L — ABNORMAL LOW (ref 22–32)
CO2: 23 mmol/L (ref 22–32)
CO2: 23 mmol/L (ref 22–32)
Calcium: 8.9 mg/dL (ref 8.9–10.3)
Calcium: 8.3 mg/dL — ABNORMAL LOW (ref 8.9–10.3)
Calcium: 8.5 mg/dL — ABNORMAL LOW (ref 8.9–10.3)
Calcium: 8.5 mg/dL — ABNORMAL LOW (ref 8.9–10.3)
Calcium: 8.6 mg/dL — ABNORMAL LOW (ref 8.9–10.3)
Chloride: 99 mmol/L (ref 98–111)
Chloride: 111 mmol/L (ref 98–111)
Chloride: 116 mmol/L — ABNORMAL HIGH (ref 98–111)
Chloride: 118 mmol/L — ABNORMAL HIGH (ref 98–111)
Chloride: 118 mmol/L — ABNORMAL HIGH (ref 98–111)
Creatinine, Ser: 2.9 mg/dL — ABNORMAL HIGH (ref 0.44–1.00)
Creatinine, Ser: 2.2 mg/dL — ABNORMAL HIGH (ref 0.44–1.00)
Creatinine, Ser: 1.75 mg/dL — ABNORMAL HIGH (ref 0.44–1.00)
Creatinine, Ser: 1.24 mg/dL — ABNORMAL HIGH (ref 0.44–1.00)
Creatinine, Ser: 1.19 mg/dL — ABNORMAL HIGH (ref 0.44–1.00)
GFR, Estimated: 18 mL/min — ABNORMAL LOW (ref >60)
GFR, Estimated: 25 mL/min — ABNORMAL LOW (ref >60)
GFR, Estimated: 32 mL/min — ABNORMAL LOW (ref >60)
GFR, Estimated: 49 mL/min — ABNORMAL LOW (ref >60)
GFR, Estimated: 51 mL/min — ABNORMAL LOW (ref >60)
Glucose, Bld: 1065 mg/dL (ref 70–99)
Glucose, Bld: 562 mg/dL (ref 70–99)
Glucose, Bld: 370 mg/dL — ABNORMAL HIGH (ref 70–99)
Glucose, Bld: 204 mg/dL — ABNORMAL HIGH (ref 70–99)
Glucose, Bld: 176 mg/dL — ABNORMAL HIGH (ref 70–99)
Potassium: >7.5 mmol/L (ref 3.5–5.1)
Potassium: 4 mmol/L (ref 3.5–5.1)
Potassium: 3.8 mmol/L (ref 3.5–5.1)
Potassium: 3.9 mmol/L (ref 3.5–5.1)
Potassium: 4.1 mmol/L (ref 3.5–5.1)
Sodium: 137 mmol/L (ref 135–145)
Sodium: 141 mmol/L (ref 135–145)
Sodium: 146 mmol/L — ABNORMAL HIGH (ref 135–145)
Sodium: 145 mmol/L (ref 135–145)
Sodium: 147 mmol/L — ABNORMAL HIGH (ref 135–145)

## 2020-09-15 LAB — CREATININE, SERUM
Creatinine, Ser: 2.44 mg/dL — ABNORMAL HIGH (ref 0.44–1.00)
GFR, Estimated: 22 mL/min — ABNORMAL LOW (ref >60)

## 2020-09-15 LAB — POCT I-STAT 7, (LYTES, BLD GAS, ICA,H+H)
Acid-base deficit: 18 mmol/L — ABNORMAL HIGH (ref 0.0–2.0)
Bicarbonate: 7.7 mmol/L — ABNORMAL LOW (ref 20.0–28.0)
Calcium, Ion: 1.31 mmol/L (ref 1.15–1.40)
HCT: 31 % — ABNORMAL LOW (ref 36.0–46.0)
Hemoglobin: 10.5 g/dL — ABNORMAL LOW (ref 12.0–15.0)
O2 Saturation: 97 %
Patient temperature: 99
Potassium: 3.6 mmol/L (ref 3.5–5.1)
Sodium: 143 mmol/L (ref 135–145)
TCO2: 8 mmol/L — ABNORMAL LOW (ref 22–32)
pCO2 arterial: 19.5 mmHg — CL (ref 32.0–48.0)
pH, Arterial: 7.208 — ABNORMAL LOW (ref 7.350–7.450)
pO2, Arterial: 114 mmHg — ABNORMAL HIGH (ref 83.0–108.0)

## 2020-09-15 LAB — CBG MONITORING, ED
Glucose-Capillary: >600 mg/dL (ref 70–99)
Glucose-Capillary: >600 mg/dL (ref 70–99)
Glucose-Capillary: >600 mg/dL (ref 70–99)
Glucose-Capillary: >600 mg/dL (ref 70–99)
Glucose-Capillary: >600 mg/dL (ref 70–99)
Glucose-Capillary: >600 mg/dL (ref 70–99)

## 2020-09-15 LAB — CBC WITH DIFFERENTIAL/PLATELET
Abs Immature Granulocytes: 0.71 10*3/uL — ABNORMAL HIGH (ref 0.00–0.07)
Basophils Absolute: 0.1 10*3/uL (ref 0.0–0.1)
Basophils Relative: 1 %
Eosinophils Absolute: 0.1 10*3/uL (ref 0.0–0.5)
Eosinophils Relative: 0 %
HCT: 39.3 % (ref 36.0–46.0)
Hemoglobin: 11.2 g/dL — ABNORMAL LOW (ref 12.0–15.0)
Immature Granulocytes: 3 %
Lymphocytes Relative: 28 %
Lymphs Abs: 7.1 10*3/uL — ABNORMAL HIGH (ref 0.7–4.0)
MCH: 28 pg (ref 26.0–34.0)
MCHC: 28.5 g/dL — ABNORMAL LOW (ref 30.0–36.0)
MCV: 98.3 fL (ref 80.0–100.0)
Monocytes Absolute: 1.2 10*3/uL — ABNORMAL HIGH (ref 0.1–1.0)
Monocytes Relative: 4 %
Neutro Abs: 16.7 10*3/uL — ABNORMAL HIGH (ref 1.7–7.7)
Neutrophils Relative %: 64 %
Platelets: 184 10*3/uL (ref 150–400)
RBC: 4 MIL/uL (ref 3.87–5.11)
RDW: 15 % (ref 11.5–15.5)
WBC: 25.9 10*3/uL — ABNORMAL HIGH (ref 4.0–10.5)
nRBC: 0 % (ref 0.0–0.2)

## 2020-09-15 LAB — LACTIC ACID, PLASMA
Lactic Acid, Venous: 6.8 mmol/L (ref 0.5–1.9)
Lactic Acid, Venous: 3.8 mmol/L (ref 0.5–1.9)
Lactic Acid, Venous: 2.5 mmol/L (ref 0.5–1.9)

## 2020-09-15 LAB — BETA-HYDROXYBUTYRIC ACID
Beta-Hydroxybutyric Acid: >8 mmol/L — ABNORMAL HIGH (ref 0.05–0.27)
Beta-Hydroxybutyric Acid: 3.47 mmol/L — ABNORMAL HIGH (ref 0.05–0.27)
Beta-Hydroxybutyric Acid: 0.42 mmol/L — ABNORMAL HIGH (ref 0.05–0.27)

## 2020-09-15 LAB — PROCALCITONIN: Procalcitonin: 0.75 ng/mL

## 2020-09-15 LAB — CORTISOL: Cortisol, Plasma: 60.7 ug/dL

## 2020-09-15 LAB — ETHANOL: Alcohol, Ethyl (B): <10 mg/dL (ref <10)

## 2020-09-15 LAB — SARS CORONAVIRUS 2 (TAT 6-24 HRS): SARS Coronavirus 2: NEGATIVE

## 2020-09-15 LAB — OSMOLALITY: Osmolality: 355 mOsm/kg (ref 275–295)

## 2020-09-15 LAB — PATHOLOGIST SMEAR REVIEW

## 2020-09-15 LAB — HEMOGLOBIN A1C
Hgb A1c MFr Bld: 11.3 % — ABNORMAL HIGH (ref 4.8–5.6)
Mean Plasma Glucose: 277.61 mg/dL

## 2020-09-15 LAB — CK: Total CK: 141 U/L (ref 38–234)

## 2020-09-15 LAB — MRSA PCR SCREENING: MRSA by PCR: NEGATIVE

## 2020-09-15 MED ORDER — INSULIN ASPART 100 UNIT/ML ~~LOC~~ SOLN
2.0000 [IU] | SUBCUTANEOUS | Status: DC
  Administered 2020-09-16: 4 [IU] via SUBCUTANEOUS
  Administered 2020-09-16 (×2): 2 [IU] via SUBCUTANEOUS
  Administered 2020-09-17: 4 [IU] via SUBCUTANEOUS
  Administered 2020-09-17: 10 [IU] via SUBCUTANEOUS

## 2020-09-15 MED ORDER — INSULIN REGULAR(HUMAN) IN NACL 100-0.9 UT/100ML-% IV SOLN
INTRAVENOUS | Status: DC
  Administered 2020-09-15 (×2): 11 [IU]/h via INTRAVENOUS
  Filled 2020-09-15: qty 100

## 2020-09-15 MED ORDER — INSULIN REGULAR(HUMAN) IN NACL 100-0.9 UT/100ML-% IV SOLN
INTRAVENOUS | Status: DC
  Administered 2020-09-15: 11 [IU]/h via INTRAVENOUS
  Administered 2020-09-15: 9.5 [IU]/h via INTRAVENOUS
  Filled 2020-09-15: qty 100

## 2020-09-15 MED ORDER — SODIUM CHLORIDE 0.9 % IV SOLN
1.0000 g | INTRAVENOUS | Status: DC
  Administered 2020-09-15 – 2020-09-17 (×3): 1 g via INTRAVENOUS
  Filled 2020-09-15: qty 1
  Filled 2020-09-15 (×3): qty 10

## 2020-09-15 MED ORDER — SODIUM CHLORIDE 0.9 % IV SOLN
250.0000 mL | INTRAVENOUS | Status: DC
  Administered 2020-09-16: 250 mL via INTRAVENOUS

## 2020-09-15 MED ORDER — DOCUSATE SODIUM 100 MG PO CAPS: 100.0000 mg | ORAL_CAPSULE | Freq: Two times a day (BID) | ORAL | Status: DC | PRN

## 2020-09-15 MED ORDER — ONDANSETRON HCL 4 MG/2ML IJ SOLN
4.0000 mg | Freq: Four times a day (QID) | INTRAMUSCULAR | Status: DC | PRN
  Filled 2020-09-15: qty 2

## 2020-09-15 MED ORDER — LACTATED RINGERS IV BOLUS
1000.0000 mL | Freq: Once | INTRAVENOUS | Status: AC
  Administered 2020-09-15: 1000 mL via INTRAVENOUS

## 2020-09-15 MED ORDER — SODIUM CHLORIDE 0.9 % IV SOLN
500.0000 mg | INTRAVENOUS | Status: DC
  Administered 2020-09-15 – 2020-09-17 (×3): 500 mg via INTRAVENOUS
  Filled 2020-09-15 (×5): qty 500

## 2020-09-15 MED ORDER — LACTATED RINGERS IV SOLN: INTRAVENOUS | Status: DC

## 2020-09-15 MED ORDER — INSULIN DETEMIR 100 UNIT/ML ~~LOC~~ SOLN
8.0000 [IU] | Freq: Two times a day (BID) | SUBCUTANEOUS | Status: DC
  Administered 2020-09-15 – 2020-09-16 (×2): 8 [IU] via SUBCUTANEOUS
  Filled 2020-09-15 (×3): qty 0.08

## 2020-09-15 MED ORDER — DEXTROSE 50 % IV SOLN
0.0000 mL | INTRAVENOUS | Status: DC | PRN
Start: 2020-09-15 — End: 2020-09-19
  Administered 2020-09-17: 50 mL via INTRAVENOUS
  Filled 2020-09-15: qty 50

## 2020-09-15 MED ORDER — PANTOPRAZOLE SODIUM 40 MG IV SOLR
40.0000 mg | Freq: Every day | INTRAVENOUS | Status: DC
  Administered 2020-09-15 – 2020-09-16 (×2): 40 mg via INTRAVENOUS
  Filled 2020-09-15 (×2): qty 40

## 2020-09-15 MED ORDER — DEXTROSE IN LACTATED RINGERS 5 % IV SOLN: INTRAVENOUS | Status: DC

## 2020-09-15 MED ORDER — POLYETHYLENE GLYCOL 3350 17 G PO PACK: 17.0000 g | PACK | Freq: Every day | ORAL | Status: DC | PRN

## 2020-09-15 MED ORDER — FLUTICASONE FUROATE-VILANTEROL 200-25 MCG/INH IN AEPB
1.0000 | INHALATION_SPRAY | Freq: Every day | RESPIRATORY_TRACT | Status: DC
  Administered 2020-09-16 – 2020-09-18 (×3): 1 via RESPIRATORY_TRACT
  Filled 2020-09-15 (×2): qty 28

## 2020-09-15 MED ORDER — DEXTROSE 50 % IV SOLN: 0.0000 mL | INTRAVENOUS | Status: DC | PRN

## 2020-09-15 MED ORDER — HEPARIN SODIUM (PORCINE) 5000 UNIT/ML IJ SOLN
5000.0000 [IU] | Freq: Three times a day (TID) | INTRAMUSCULAR | Status: DC
  Administered 2020-09-15 – 2020-09-18 (×11): 5000 [IU] via SUBCUTANEOUS
  Filled 2020-09-15 (×11): qty 1

## 2020-09-15 MED ORDER — POTASSIUM CHLORIDE 10 MEQ/100ML IV SOLN
10.0000 meq | INTRAVENOUS | Status: AC
  Administered 2020-09-15 (×4): 10 meq via INTRAVENOUS
  Filled 2020-09-15 (×4): qty 100

## 2020-09-15 MED ORDER — SODIUM BICARBONATE 8.4 % IV SOLN
INTRAVENOUS | Status: AC
  Filled 2020-09-15: qty 50

## 2020-09-15 MED ORDER — ALBUTEROL SULFATE HFA 108 (90 BASE) MCG/ACT IN AERS: 2.0000 | INHALATION_SPRAY | Freq: Four times a day (QID) | RESPIRATORY_TRACT | Status: DC | PRN

## 2020-09-15 NOTE — Progress Notes (Signed)
eLink Physician-Brief Progress Note Patient Name: Meagan Cummings DOB: Jun 28, 1955 MRN: [id number]   Date of Service  09/15/2020  HPI/Events of Note  Hyperglycemia - Blood glucose = 141. Patient on an insulin IV infusion for DKA. Anion Gap = 6.  eICU Interventions  Plan: 1. Levemir insulin 8 unints Q 12 hours.  2. Q 4 hour standard Novolog SSI.      Intervention Category Major Interventions: Hyperglycemia - active titration of insulin therapy  Lenell Antu 09/15/2020, 8:46 PM

## 2020-09-15 NOTE — ED Notes (Signed)
Pt placed under bair hugger at 0330 d/t rectal temp 93.1.

## 2020-09-15 NOTE — Plan of Care (Signed)
Labs reviewed. K+ improved, bicarb dramatically improved with fluids & insulin. LA and B-oh butyrate still mildly increased. PCT increased. She remains sleeply but able to wake up during examination.   Keep IVF & insulin gtt until B-OH-butyrate normalizes and AG closed.  K+ repletion Con't to monitor in ICU today Keep antibiotics for CAP  Steffanie Dunn, DO 09/15/20 3:02 PM [facility]

## 2020-09-15 NOTE — Progress Notes (Addendum)
Inpatient Diabetes Program Recommendations  AACE/ADA: New Consensus Statement on Inpatient Glycemic Control (2015)  Target Ranges:  Prepandial:   less than 140 mg/dL      Peak postprandial:   less than 180 mg/dL (1-2 hours)      Critically ill patients:  140 - 180 mg/dL   Lab Results  Component Value Date   GLUCAP >600 (HH) 09/15/2020   HGBA1C 10.9 (H) 05/01/2020   Results for LEARA, RAWL (MRN [id number]) as of 09/15/2020 08:24  Ref. Range 09/15/2020 03:09  Beta-Hydroxybutyric Acid Latest Ref Range: 0.05 - 0.27 mmol/L >8.00 (H)  Results for KELLYE, MIZNER (MRN [id number]) as of 09/15/2020 08:24  Ref. Range 09/15/2020 03:09  CO2 Latest Ref Range: 22 - 32 mmol/L <7 (L)  Results for LI, BOBO (MRN [id number]) as of 09/15/2020 08:24  Ref. Range 09/15/2020 03:09  Anion gap Latest Ref Range: 5 - 15  NOT CALCULATED  Results for QUIERRA, SILVERIO (MRN [id number]) as of 09/15/2020 08:24  Ref. Range 09/15/2020 03:09  Glucose Latest Ref Range: 70 - 99 mg/dL 5,427 (HH)   Diabetes history:  DM2 Outpatient Diabetes medications:  Novolog 1-8 units TID  Basaglar 25 units daily Current orders for Inpatient glycemic control:  IV insulin  Referral for DM evaluation.  Per notes patient has not been compliant with medications and presents to ED with DKA.  Will speak with patient when alert and oriented.  Will also follow drip rates and assist with recommendations.    Addendum@1352 :  Once acidosis clears and MD is ready to transition to SQ insulin might consider:  Lantus 15 units 2 prior prior to discontinuing IV insulin Novllog 0-15 units TID and 0-5 units QHS   Will continue to follow while inpatient.  Thank you, Dulce Sellar, RN, BSN Diabetes Coordinator Inpatient Diabetes Program [phone] (team pager from 8a-5p)

## 2020-09-15 NOTE — ED Notes (Signed)
Patient back from CT.

## 2020-09-15 NOTE — ED Provider Notes (Signed)
[facility] EMERGENCY DEPARTMENT Provider Note   CSN: [id number] Arrival date & time: 09/15/20  0250     History Chief Complaint  Patient presents with  . Hyperglycemia    Meagan Cummings is a 65 y.o. female with a history of insulin-dependent diabetes and DKA presents to the emergency department with altered mental status.  Level 5 caveat for altered mental status.  Additional history provided by EMS.  They report son on scene states that he found her down in the house altered and called 911.  He was unable to provide any additional information about her other than she has a history of diabetes.  EMS reports she is cold to touch and hypotensive for them.  Blood sugar read high on their monitor.  Records reviewed.  Patient has been admitted numerous times for DKA.  Last admission was January 2022.  The history is provided by medical records and the EMS personnel. The history is limited by the condition of the patient. No language interpreter was used.       Past Medical History:  Diagnosis Date  . Depression   . Diabetes mellitus without complication [facility])     Patient Active Problem List   Diagnosis Date Noted  . DKA (diabetic ketoacidosis) (HCC) 04/29/2020  . Altered mental status   . Hypernatremia   . Ketosis (HCC) 12/25/2019  . Acute metabolic encephalopathy 12/25/2019  . Hypoglycemia 12/24/2019  . Acute metabolic encephalopathy 12/24/2019  . Uncontrolled diabetes mellitus (HCC) 12/24/2019  . Acute renal failure syndrome (HCC)   . Diabetic ketoacidosis (HCC)     Past Surgical History:  Procedure Laterality Date  . ABDOMINAL HYSTERECTOMY       OB History   No obstetric history on file.     No family history on file.  Social History   Tobacco Use  . Smoking status: Current Every Day Smoker    Packs/day: 0.50    Types: Cigarettes  . Smokeless tobacco: Never Used  Substance Use Topics  . Alcohol use: Yes    Alcohol/week: 1.0  standard drink    Types: 1 Cans of beer per week  . Drug use: No    Home Medications Prior to Admission medications   Medication Sig Start Date End Date Taking? Authorizing Provider  albuterol (VENTOLIN HFA) 108 (90 Base) MCG/ACT inhaler Inhale 2 puffs into the lungs every 6 (six) hours as needed for wheezing. 02/07/20   [provider]  ARIPiprazole (ABILIFY) 2 MG tablet Take 2 mg by mouth daily. 03/31/20   [provider]  atorvastatin (LIPITOR) 20 MG tablet Take 20 mg by mouth daily.    [provider]  budesonide-formoterol (SYMBICORT) 160-4.5 MCG/ACT inhaler Inhale 2 puffs into the lungs 2 (two) times daily.    [provider]  cetirizine (ZYRTEC) 10 MG tablet Take 10 mg by mouth daily.    [provider]  DULoxetine (CYMBALTA) 60 MG capsule Take 60 mg by mouth daily. 12/04/19   [provider]  fludrocortisone (FLORINEF) 0.1 MG tablet Take 0.1 mg by mouth 2 (two) times daily.    [provider]  fluticasone (FLONASE) 50 MCG/ACT nasal spray Place 1 spray into both nostrils daily. Patient not taking: Reported on 12/26/2019 12/26/14   Albertine Grates, MD  gabapentin (NEURONTIN) 600 MG tablet Take 1 tablet (600 mg total) by mouth 3 (three) times daily. 05/03/20   Swayze, Ava, DO  glucose blood (ACCU-CHEK AVIVA PLUS) test strip 1 each 4 (four) times  daily. 12/12/14   [provider]  insulin aspart (NOVOLOG FLEXPEN) 100 UNIT/ML FlexPen Before each meal 3 times a day, 140-199 - 1 units, 200-250 - 3 units, 251-299 - 5 units,  300-349 - 6 units,  350 or above 8 units. Insulin PEN if approved, provide syringes and needles if needed. 01/01/20   Yvette Rack, MD  Insulin Glargine (BASAGLAR KWIKPEN) 100 UNIT/ML Inject 0.25 mLs (25 Units total) into the skin daily. 01/01/20 03/31/20  Yvette Rack, MD    Allergies    Other  Review of Systems   Review of Systems  Unable to perform ROS: Mental status change    Physical Exam Updated  Vital Signs BP (!) 90/54   Pulse (!) 109   Temp (!) 96.8 F (36 C) (Oral)   Resp (!) 24   SpO2 100%   Physical Exam Vitals and nursing note reviewed.  Constitutional:      General: She is in acute distress.     Appearance: She is ill-appearing. She is not diaphoretic.     Comments: Cachectic Moaning  HENT:     Head: Normocephalic.     Mouth/Throat:     Mouth: Mucous membranes are dry.     Pharynx: Oropharynx is clear.     Comments: His membranes are very dry, ketones on breath Eyes:     General: No scleral icterus.    Conjunctiva/sclera: Conjunctivae normal.     Pupils: Pupils are equal, round, and reactive to light.  Cardiovascular:     Rate and Rhythm: Tachycardia present. Rhythm irregular.     Pulses:          Radial pulses are 1+ on the right side and 1+ on the left side.     Comments: Very thready radial pulses Pulmonary:     Effort: Tachypnea and accessory muscle usage present. No prolonged expiration, respiratory distress or retractions.     Breath sounds: No stridor. No wheezing or rhonchi.     Comments: Equal chest rise. No increased work of breathing. Chest:     Comments: No obvious contusion, flail segment or crepitus Abdominal:     General: There is no distension.     Palpations: Abdomen is soft.     Tenderness: There is no abdominal tenderness.     Comments: Nondistended, soft  Musculoskeletal:     Cervical back: Normal range of motion.     Comments: Moves all extremities equally and without difficulty.  Skin:    General: Skin is warm and dry.     Capillary Refill: Capillary refill takes less than 2 seconds.  Neurological:     Mental Status: She is lethargic and confused.     GCS: GCS eye subscore is 4. GCS verbal subscore is 3. GCS motor subscore is 4.     Comments: Lethargic and confused -patient yelling help me but can answer no questions and does not follow commands. Moves all extremities without difficulty and with 5/5 strength.  Psychiatric:         Mood and Affect: Mood normal.     ED Results / Procedures / Treatments   Labs (all labs ordered are listed, but only abnormal results are displayed) Labs Reviewed  BASIC METABOLIC PANEL - Abnormal; Notable for the following components:      Result Value   Potassium >7.5 (*)    CO2 <7 (*)    Glucose, Bld 1,065 (*)    BUN 53 (*)    Creatinine, Ser  2.90 (*)    GFR, Estimated 18 (*)    All other components within normal limits  BETA-HYDROXYBUTYRIC ACID - Abnormal; Notable for the following components:   Beta-Hydroxybutyric Acid >8.00 (*)    All other components within normal limits  CBC WITH DIFFERENTIAL/PLATELET - Abnormal; Notable for the following components:   WBC 25.9 (*)    Hemoglobin 11.2 (*)    MCHC 28.5 (*)    Neutro Abs 16.7 (*)    Lymphs Abs 7.1 (*)    Monocytes Absolute 1.2 (*)    Abs Immature Granulocytes 0.71 (*)    All other components within normal limits  LACTIC ACID, PLASMA - Abnormal; Notable for the following components:   Lactic Acid, Venous 6.8 (*)    All other components within normal limits  CBG MONITORING, ED - Abnormal; Notable for the following components:   Glucose-Capillary >600 (*)    All other components within normal limits  CBG MONITORING, ED - Abnormal; Notable for the following components:   Glucose-Capillary >600 (*)    All other components within normal limits  CBG MONITORING, ED - Abnormal; Notable for the following components:   Glucose-Capillary >600 (*)    All other components within normal limits  CBG MONITORING, ED - Abnormal; Notable for the following components:   Glucose-Capillary >600 (*)    All other components within normal limits  URINE CULTURE  SARS CORONAVIRUS 2 (TAT 6-24 HRS)  CULTURE, BLOOD (ROUTINE X 2)  CULTURE, BLOOD (ROUTINE X 2)  ETHANOL  BASIC METABOLIC PANEL  BASIC METABOLIC PANEL  BASIC METABOLIC PANEL  BASIC METABOLIC PANEL  BETA-HYDROXYBUTYRIC ACID  BETA-HYDROXYBUTYRIC ACID  RAPID URINE DRUG  SCREEN, HOSP PERFORMED  LACTIC ACID, PLASMA  URINALYSIS, COMPLETE (UACMP) WITH MICROSCOPIC  CK  PATHOLOGIST SMEAR REVIEW  OSMOLALITY  CORTISOL  PROCALCITONIN  CBC  CREATININE, SERUM  STREP PNEUMONIAE URINARY ANTIGEN  LEGIONELLA PNEUMOPHILA SEROGP 1 UR AG  HEMOGLOBIN A1C  BLOOD GAS, ARTERIAL  I-STAT VENOUS BLOOD GAS, ED  I-STAT ARTERIAL BLOOD GAS, ED    EKG EKG Interpretation  Date/Time:  Monday September 15 2020 06:29:18 EDT Ventricular Rate:  110 PR Interval:    QRS Duration: 74 QT Interval:  343 QTC Calculation: 464 R Axis:   79 Text Interpretation: Sinus tachycardia Borderline ST depression, anterolateral leads Confirmed by Zadie Rhine ([id number]) on 09/15/2020 6:31:05 AM     Radiology CT Head Wo Contrast  Result Date: 09/15/2020 CLINICAL DATA:  Altered mental status EXAM: CT HEAD WITHOUT CONTRAST TECHNIQUE: Contiguous axial images were obtained from the base of the skull through the vertex without intravenous contrast. COMPARISON:  None. FINDINGS: Brain: Normal anatomic configuration. Parenchymal volume loss is commensurate with the patient's age. Mild periventricular white matter changes are present likely reflecting the sequela of small vessel ischemia. No abnormal intra or extra-axial mass lesion or fluid collection. No abnormal mass effect or midline shift. No evidence of acute intracranial hemorrhage or infarct. Ventricular size is normal. Cerebellum unremarkable. Vascular: No asymmetric hyperdense vasculature at the skull base. Skull: Intact Sinuses/Orbits: Paranasal sinuses are clear. Orbits are unremarkable. Other: Mastoid air cells and middle ear cavities are clear. IMPRESSION: No acute intracranial hemorrhage or infarct.  Mild senescent change. Electronically Signed   By: Helyn Numbers MD   On: 09/15/2020 06:28   DG Chest Portable 1 View  Result Date: 09/15/2020 CLINICAL DATA:  Altered mental status and hyperglycemia EXAM: PORTABLE CHEST 1 VIEW COMPARISON:   04/28/2020 FINDINGS: Cardiac shadow is mildly prominent but accentuated  by the portable technique. Large skin fold is noted over the midline. Lungs are well aerated bilaterally. Increased density over the right lung is felt to be related to underlying soft tissue prominence. No bony abnormality is seen. IMPRESSION: No acute abnormality noted. Electronically Signed   By: Alcide Clever M.D.   On: 09/15/2020 03:34    Procedures .Critical Care Performed by: Dierdre Forth, PA-C Authorized by: Dierdre Forth, PA-C   Critical care provider statement:    Critical care time (minutes):  75   Critical care time was exclusive of:  Separately billable procedures and treating other patients and teaching time   Critical care was necessary to treat or prevent imminent or life-threatening deterioration of the following conditions:  Endocrine crisis and shock   Critical care was time spent personally by me on the following activities:  Discussions with consultants, evaluation of patient's response to treatment, examination of patient, ordering and performing treatments and interventions, ordering and review of laboratory studies, ordering and review of radiographic studies, pulse oximetry, re-evaluation of patient's condition, obtaining history from patient or surrogate and review of old charts   I assumed direction of critical care for this patient from another provider in my specialty: no     Care discussed with: admitting provider     Care discussed with comment:  Attending provider  Ultrasound ED Peripheral IV (Provider)  Date/Time: 09/15/2020 5:27 AM Performed by: Dierdre Forth, PA-C Authorized by: Dierdre Forth, PA-C   Procedure details:    Indications: hydration, hypotension, multiple failed IV attempts and poor IV access     Skin Prep: chlorhexidine gluconate     Location:  Right AC   Angiocath:  20 G   Bedside Ultrasound Guided: Yes     Images: not archived     Patient  tolerated procedure without complications: Yes     Dressing applied: Yes       Medications Ordered in ED Medications  lactated ringers infusion ( Intravenous New Bag/Given 09/15/20 0617)  dextrose 5 % in lactated ringers infusion (has no administration in time range)  dextrose 50 % solution 0-50 mL (has no administration in time range)  sodium bicarbonate 1 mEq/mL injection (  Not Given 09/15/20 0554)  albuterol (VENTOLIN HFA) 108 (90 Base) MCG/ACT inhaler 2 puff (has no administration in time range)  fluticasone furoate-vilanterol (BREO ELLIPTA) 200-25 MCG/INH 1 puff (has no administration in time range)  docusate sodium (COLACE) capsule 100 mg (has no administration in time range)  polyethylene glycol (MIRALAX / GLYCOLAX) packet 17 g (has no administration in time range)  heparin injection 5,000 Units (has no administration in time range)  ondansetron (ZOFRAN) injection 4 mg (has no administration in time range)  pantoprazole (PROTONIX) injection 40 mg (has no administration in time range)  0.9 %  sodium chloride infusion (has no administration in time range)  lactated ringers infusion (has no administration in time range)  dextrose 5 % in lactated ringers infusion (has no administration in time range)  dextrose 50 % solution 0-50 mL (has no administration in time range)  insulin regular, human (MYXREDLIN) 100 units/ 100 mL infusion (has no administration in time range)  cefTRIAXone (ROCEPHIN) 1 g in sodium chloride 0.9 % 100 mL IVPB (has no administration in time range)  azithromycin (ZITHROMAX) 500 mg in sodium chloride 0.9 % 250 mL IVPB (has no administration in time range)  lactated ringers bolus 1,000 mL (0 mLs Intravenous Stopped 09/15/20 0434)  lactated ringers bolus 1,000 mL (0  mLs Intravenous Stopped 09/15/20 0554)    ED Course  I have reviewed the triage vital signs and the nursing notes.  Pertinent labs & imaging results that were available during my care of the patient were  reviewed by me and considered in my medical decision making (see chart for details).  Clinical Course as of 09/15/20 0702  Mon Sep 15, 2020  0519 Creatinine(!): 2.90 AKI [HM]  0519 Hemoglobin(!): 11.2 baseline [HM]  0519 WBC(!): 25.9 Significant elevation [HM]  0520 Glucose(!!): 1,065 [HM]  0520 Potassium(!!): >7.5 Patient receiving fluids and insulin [HM]  0520 BP(!): 81/55 She has received 3 L of fluid.  Will give a fourth at this time. [HM]    Clinical Course User Index [HM] Muthersbaugh, Boyd KerbsHannah, PA-C   MDM Rules/Calculators/A&P                           Presents to the emergency department critically ill with altered mental status.  She is tachycardic, hypotensive, tachypneic and hypothermic.  She is a history of DKA but was found down.  Unknown downtime by family.  Presumed DKA given history.    Difficult access.  Patient flailing but too hypotensive to give sedation.  Right brachial ultrasound-guided IV placed however patient pulled this out.  Permission given to place 20-gauge Angiocath in left foot.  Second 20-gauge Angiocath placed in the right forearm.  Patient given 3 L of fluid with some improvement in mental status as patient becomes more alert and combative but is harder to redirect.  She remains hypotensive. bair hugger placed for hypothermia.  I-STAT arterial gas with pH of 6.98 and bicarb less than 15.  BMP with potassium greater than 7.5 however noted to be hemolyzed, carb less than 7 and glucose greater than 1000.  Patient additionally with AKI with creatinine greater than 2.90 and elevated BUN.  Given patient's downtime concern for possible rhabdomyolysis as well.  CK pending.  CT head pending for likely fall and altered mental status.  CBC with significant leukocytosis and lactic acid elevated at 6.8.  Suspect this is secondary to DKA as opposed to sepsis.  We will continue to monitor.  The patient was discussed with and evaluated by Dr. Bebe ShaggyWickline who agrees with  the treatment plan.  5:22 AM The patient is noted to have a lactate>4. With the current information available to me, I don't think the patient is in septic shock. The lactate>4, is related to OTHER SHOCK DKA.  The patient is noted to have a MAP's <65/ SBP's <90. With the current information available to me, I don't think the patient is in septic shock. The MAP's <65/ SBP's <90, is related to an acute condition that is not due to an infectionDKA.  5:49 AM Discussed with Dr. Jayme CloudGonzalez of critical care they will admit.    6:29 AM Patient with improving mental status.  Rectal temp 97.8. bair hugger discontinued and warm blankets were given.  Last blood pressure 90/54.    7:02 AM CT head without acute intracranial hemorrhage.  I personally evaluated these images.   Final Clinical Impression(s) / ED Diagnoses Final diagnoses:  Hyperglycemia  Diabetic ketoacidosis with coma associated with type 1 diabetes mellitus (HCC)  Altered mental status, unspecified altered mental status type  Hypothermia, initial encounter  [facility](HCC)    Rx / DC Orders ED Discharge Orders    None       Milta DeitersMuthersbaugh, Hannah, PA-C 09/15/20 [id number]  Zadie Rhine, MD 09/15/20 2316

## 2020-09-15 NOTE — ED Triage Notes (Signed)
Pt arrives to ED BIB [facility] for Hyperglycemia. Per EMS pt's son found her altered and confused. CBG with EMS 548. Per pt's son pt is diabetic but has not been compliant with meds and overall health. Pt is altered and unable to follow commands at this time.   BP 112/88 HR 110 O2 094% RA CBG 548

## 2020-09-15 NOTE — ED Notes (Addendum)
Pts oral temp 97.7. Bair hugger removed. Soft restraints removed. Pt more responsive. Bedding changed, external catheter remains in place.

## 2020-09-15 NOTE — H&P (Signed)
NAME:  Meagan Cummings, MRN:  [id number], DOB:  [birth year], LOS: 0 ADMISSION DATE:  09/15/2020, CONSULTATION DATE:  3/28 REFERRING MD:  Dr. Bebe Shaggy, CHIEF COMPLAINT:  DKA   History of Present Illness:  Patient is encephalopathic and/or intubated. Therefore history has been obtained from chart review.  65 year old female with history significant for DM and frequent admissions for DKA, most recently in January of this year to [place]. On 3/28 she was found down buy her son and EMS was called. Upon EMS assessment the patient was cold to the touch and hypotensive. Glucometer read "high". Upon arrival to the ED she was treated for presumed DKA including 3L crystalloid bolus and insulin infusion. She remained hypotensive despite IVF.  Emergency department course complicated by ongoing confusion and pulling IVs. Bair hugger placed for hypothermia.  Multiple metabolic derangements on labs.  Beta hydroxybutyrate greater than 8.  Potassium greater than 7.5 but hemolyzed.  The patient was initiated on DKA protocol and PCCM was asked to admit to the ICU.  Pertinent  Medical History   has a past medical history of Depression and Diabetes mellitus without complication (HCC).   Significant Hospital Events: Including procedures, antibiotic start and stop dates in addition to other pertinent events   . 3/28 admitted to [facility] for DKA.  ICU admission because of ongoing hypotension despite fluids.  Interim History / Subjective:    Objective   Blood pressure (!) 94/56, pulse (!) 107, temperature (!) 96.8 F (36 C), temperature source Oral, resp. rate (!) 22, SpO2 100 %.       No intake or output data in the 24 hours ending 09/15/20 0608 There were no vitals filed for this visit.  Examination: General: Elderly appearing thin female in no acute distress HENT: Normocephalic, atraumatic, PERRL Lungs: Clear bilateral breath sounds Cardiovascular: Borderline tachycardia, regular, no murmurs rubs  gallops Abdomen: Soft, nontender, nondistended Extremities: No acute deformity.  Moving all extremities. Neuro: Lethargic, only briefly arouses to verbal stimuli.  Nonverbal   Labs/imaging that I havepersonally reviewed  (right click and "Reselect all SmartList Selections" daily)  Hemoglobin greater than 7.5 but hemolyzed, glucose 1065, creatinine 2.9, BUN 53, WBC 25.9, lactic acid 6.8, beta hydroxybutyric acid > 8, lactic acid 6.8.   CT head: not yet read, but no obvious hemorrhage.   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 Cannot rule out sepsis as her WBC is 25.9, but this is more likely due to DKA.  - continued IVF resuscitation. MAP sitting right around 65 at time of admission.  - may need to start pressors is BP doesn't continue to improve - MAP goal 65 - I see fludrocortisone on her home med list. Will check a cortisol. Hold off on stress steroids for now due to DKA  Leukocytosis: likely reactive due to DKA, but cannot rule out infectious source.  - Empiric ABX for sepsis - PCT  AKI: Creatinine 2.9 from baseline 0.7.  - Hydrate - Follow BMP  Hyperkalemia: K > 7, but sample is hemolyzed. In the setting of DKA with not treat and repeat K.  - Repeat BMP pending  COPD without acute exacerbation - continue home symbicort  Best practice (right click and "Reselect all SmartList Selections" daily)  Diet:  NPO Pain/Anxiety/Delirium protocol (if indicated): No VAP protocol (if indicated): Not indicated DVT prophylaxis: Subcutaneous Heparin GI prophylaxis: N/A Glucose control:  Insulin gtt Central venous access:  N/A Arterial line:  N/A Foley:   N/A Mobility:  bed rest  PT consulted: N/A Last date of multidisciplinary goals of care discussion []  Code Status:  full code Disposition: ICU  Labs   CBC: Recent Labs  Lab 09/15/20 0309  WBC 25.9*  NEUTROABS 16.7*  HGB 11.2*  HCT 39.3  MCV 98.3  PLT 184    Basic Metabolic Panel: Recent Labs  Lab 09/15/20 0309  NA 137  K >7.5*  CL 99  CO2 <7*  GLUCOSE 1,065*  BUN 53*  CREATININE 2.90*  CALCIUM 8.9   GFR: CrCl cannot be calculated (Unknown ideal weight.). Recent Labs  Lab 09/15/20 0309 09/15/20 0420  WBC 25.9*  --   LATICACIDVEN  --  6.8*    Liver Function Tests: No results for input(s): AST, ALT, ALKPHOS, BILITOT, PROT, ALBUMIN in the last 168 hours. No results for input(s): LIPASE, AMYLASE in the last 168 hours. No results for input(s): AMMONIA in the last 168 hours.  ABG    Component Value Date/Time   PHART 6.917 (LL) 12/22/2014 0657   PCO2ART 13.9 (LL) 12/22/2014 0657   PO2ART 144.0 (H) 12/22/2014 0657   HCO3 28.1 (H) 04/29/2020 0940   TCO2 6 (L) 04/28/2020 2247   ACIDBASEDEF 27.0 (H) 04/28/2020 2247   O2SAT 62.0 04/29/2020 0940     Coagulation Profile: No results for input(s): INR, PROTIME in the last 168 hours.  Cardiac Enzymes: No results for input(s): CKTOTAL, CKMB, CKMBINDEX, TROPONINI in the last 168 hours.  HbA1C: Hgb A1c MFr Bld  Date/Time Value Ref Range Status  05/01/2020 03:24 AM 10.9 (H) 4.8 - 5.6 % Final    Comment:    (NOTE) Pre diabetes:          5.7%-6.4%  Diabetes:              >6.4%  Glycemic control for   <7.0% adults with diabetes   12/24/2019 05:37 PM 10.9 (H) 4.8 - 5.6 % Final    Comment:    (NOTE) Pre diabetes:          5.7%-6.4%  Diabetes:              >6.4%  Glycemic control for   <7.0% adults with diabetes     CBG: Recent Labs  Lab 09/15/20 0257 09/15/20 0425 09/15/20 0534  GLUCAP >600* >600* >600*    Review of Systems:   Patient is encephalopathic and/or intubated. Therefore history has  been obtained from chart review.   Past Medical History:  She,  has a past medical history of Depression and Diabetes mellitus without complication (HCC).   Surgical History:   Past Surgical History:  Procedure Laterality Date  . ABDOMINAL HYSTERECTOMY       Social History:   reports that she has been smoking cigarettes. She has been smoking about 0.50 packs per day. She has never used smokeless tobacco. She reports current alcohol use of about 1.0 standard drink of alcohol per week. She reports that she does not use drugs.   Family History:  Her family history is not on file.   Allergies Allergies  Allergen Reactions  . Other Rash    Pt gets rash on face and chest from cat and dog hair  Home Medications  Prior to Admission medications   Medication Sig Start Date End Date Taking? Authorizing Provider  albuterol (VENTOLIN HFA) 108 (90 Base) MCG/ACT inhaler Inhale 2 puffs into the lungs every 6 (six) hours as needed for wheezing. 02/07/20   [provider]  ARIPiprazole (ABILIFY) 2 MG tablet Take 2 mg by mouth daily. 03/31/20   [provider]  atorvastatin (LIPITOR) 20 MG tablet Take 20 mg by mouth daily.    [provider]  budesonide-formoterol (SYMBICORT) 160-4.5 MCG/ACT inhaler Inhale 2 puffs into the lungs 2 (two) times daily.    [provider]  cetirizine (ZYRTEC) 10 MG tablet Take 10 mg by mouth daily.    [provider]  DULoxetine (CYMBALTA) 60 MG capsule Take 60 mg by mouth daily. 12/04/19   [provider]  fludrocortisone (FLORINEF) 0.1 MG tablet Take 0.1 mg by mouth 2 (two) times daily.    [provider]  fluticasone (FLONASE) 50 MCG/ACT nasal spray Place 1 spray into both nostrils daily. Patient not taking: Reported on 12/26/2019 12/26/14   Albertine Grates, MD  gabapentin (NEURONTIN) 600 MG tablet Take 1 tablet (600 mg total) by mouth 3 (three) times daily. 05/03/20   Swayze, Ava, DO  glucose blood (ACCU-CHEK  AVIVA PLUS) test strip 1 each 4 (four) times daily. 12/12/14   [provider]  insulin aspart (NOVOLOG FLEXPEN) 100 UNIT/ML FlexPen Before each meal 3 times a day, 140-199 - 1 units, 200-250 - 3 units, 251-299 - 5 units,  300-349 - 6 units,  350 or above 8 units. Insulin PEN if approved, provide syringes and needles if needed. 01/01/20   Yvette Rack, MD  Insulin Glargine (BASAGLAR KWIKPEN) 100 UNIT/ML Inject 0.25 mLs (25 Units total) into the skin daily. 01/01/20 03/31/20  Yvette Rack, MD  QUEtiapine (SEROQUEL) 100 MG tablet Take 100 mg by mouth at bedtime. 12/17/19   [provider]  senna (SENOKOT) 8.6 MG tablet Take 1 tablet by mouth 2 (two) times daily as needed for constipation. 03/31/20   [provider]  traZODone (DESYREL) 50 MG tablet Take 50 mg by mouth at bedtime as needed for sleep. 03/31/20   [provider]     Critical care time: 38 minutes     Joneen Roach, AGACNP-BC [facility]  See Amion for personal pager PCCM on call pager [phone] until 7pm. Please call Elink 7p-7a. [phone]  09/15/2020 6:42 AM

## 2020-09-15 NOTE — ED Notes (Signed)
Pt to CT

## 2020-09-15 NOTE — ED Notes (Signed)
Pt has had a total of 3 liters of LR per PA order.

## 2020-09-16 ENCOUNTER — Inpatient Hospital Stay (HOSPITAL_COMMUNITY): Payer: Medicare Other

## 2020-09-16 DIAGNOSIS — G9340 Encephalopathy, unspecified: Secondary | ICD-10-CM | POA: Diagnosis not present

## 2020-09-16 DIAGNOSIS — E1011 Type 1 diabetes mellitus with ketoacidosis with coma: Secondary | ICD-10-CM | POA: Diagnosis not present

## 2020-09-16 LAB — BLOOD GAS, VENOUS
Acid-Base Excess: 0.4 mmol/L (ref 0.0–2.0)
Bicarbonate: 24.4 mmol/L (ref 20.0–28.0)
FIO2: 21
O2 Saturation: 89.1 %
Patient temperature: 36.9
pCO2, Ven: 38.4 mmHg — ABNORMAL LOW (ref 44.0–60.0)
pH, Ven: 7.418 (ref 7.250–7.430)
pO2, Ven: 53.4 mmHg — ABNORMAL HIGH (ref 32.0–45.0)

## 2020-09-16 LAB — BASIC METABOLIC PANEL
Anion gap: 10 (ref 5–15)
BUN: 20 mg/dL (ref 8–23)
CO2: 21 mmol/L — ABNORMAL LOW (ref 22–32)
Calcium: 8.8 mg/dL — ABNORMAL LOW (ref 8.9–10.3)
Chloride: 118 mmol/L — ABNORMAL HIGH (ref 98–111)
Creatinine, Ser: 0.99 mg/dL (ref 0.44–1.00)
GFR, Estimated: >60 mL/min (ref >60)
Glucose, Bld: 138 mg/dL — ABNORMAL HIGH (ref 70–99)
Potassium: 4.4 mmol/L (ref 3.5–5.1)
Sodium: 149 mmol/L — ABNORMAL HIGH (ref 135–145)

## 2020-09-16 LAB — TSH: TSH: 0.263 u[IU]/mL — ABNORMAL LOW (ref 0.350–4.500)

## 2020-09-16 LAB — CBC
HCT: 33.1 % — ABNORMAL LOW (ref 36.0–46.0)
Hemoglobin: 11.3 g/dL — ABNORMAL LOW (ref 12.0–15.0)
MCH: 28 pg (ref 26.0–34.0)
MCHC: 34.1 g/dL (ref 30.0–36.0)
MCV: 82.1 fL (ref 80.0–100.0)
Platelets: 96 10*3/uL — ABNORMAL LOW (ref 150–400)
RBC: 4.03 MIL/uL (ref 3.87–5.11)
RDW: 14.1 % (ref 11.5–15.5)
WBC: 13.3 10*3/uL — ABNORMAL HIGH (ref 4.0–10.5)
nRBC: 0 % (ref 0.0–0.2)

## 2020-09-16 LAB — BETA-HYDROXYBUTYRIC ACID: Beta-Hydroxybutyric Acid: 1.55 mmol/L — ABNORMAL HIGH (ref 0.05–0.27)

## 2020-09-16 LAB — PHOSPHORUS: Phosphorus: 1.9 mg/dL — ABNORMAL LOW (ref 2.5–4.6)

## 2020-09-16 LAB — PROCALCITONIN: Procalcitonin: 0.72 ng/mL

## 2020-09-16 LAB — AMMONIA: Ammonia: 35 umol/L (ref 9–35)

## 2020-09-16 LAB — LACTIC ACID, PLASMA: Lactic Acid, Venous: 1.4 mmol/L (ref 0.5–1.9)

## 2020-09-16 LAB — GLUCOSE, CAPILLARY
Glucose-Capillary: 102 mg/dL — ABNORMAL HIGH (ref 70–99)
Glucose-Capillary: 118 mg/dL — ABNORMAL HIGH (ref 70–99)
Glucose-Capillary: 121 mg/dL — ABNORMAL HIGH (ref 70–99)
Glucose-Capillary: 148 mg/dL — ABNORMAL HIGH (ref 70–99)
Glucose-Capillary: 154 mg/dL — ABNORMAL HIGH (ref 70–99)
Glucose-Capillary: 156 mg/dL — ABNORMAL HIGH (ref 70–99)
Glucose-Capillary: 93 mg/dL (ref 70–99)

## 2020-09-16 LAB — MAGNESIUM: Magnesium: 1.7 mg/dL (ref 1.7–2.4)

## 2020-09-16 MED ORDER — THIAMINE HCL 100 MG/ML IJ SOLN
100.0000 mg | Freq: Every day | INTRAMUSCULAR | Status: DC
  Administered 2020-09-16 – 2020-09-17 (×2): 100 mg via INTRAVENOUS
  Filled 2020-09-16 (×2): qty 2

## 2020-09-16 MED ORDER — MAGNESIUM SULFATE 2 GM/50ML IV SOLN
2.0000 g | Freq: Once | INTRAVENOUS | Status: AC
  Administered 2020-09-16: 2 g via INTRAVENOUS
  Filled 2020-09-16: qty 50

## 2020-09-16 MED ORDER — ORAL CARE MOUTH RINSE
15.0000 mL | Freq: Two times a day (BID) | OROMUCOSAL | Status: DC
  Administered 2020-09-16 – 2020-09-18 (×4): 15 mL via OROMUCOSAL

## 2020-09-16 MED ORDER — POTASSIUM PHOSPHATES 15 MMOLE/5ML IV SOLN
15.0000 mmol | Freq: Once | INTRAVENOUS | Status: AC
  Administered 2020-09-16: 15 mmol via INTRAVENOUS
  Filled 2020-09-16: qty 5

## 2020-09-16 MED ORDER — INSULIN DETEMIR 100 UNIT/ML ~~LOC~~ SOLN
10.0000 [IU] | Freq: Two times a day (BID) | SUBCUTANEOUS | Status: DC
  Administered 2020-09-16: 10 [IU] via SUBCUTANEOUS
  Filled 2020-09-16 (×2): qty 0.1

## 2020-09-16 MED ORDER — CHLORHEXIDINE GLUCONATE CLOTH 2 % EX PADS
6.0000 | MEDICATED_PAD | Freq: Every day | CUTANEOUS | Status: DC
  Administered 2020-09-16 – 2020-09-18 (×3): 6 via TOPICAL

## 2020-09-16 NOTE — Progress Notes (Signed)
eLink Physician-Brief Progress Note Patient Name: Meagan Cummings DOB: [birth year] MRN: [id number]   Date of Service  09/16/2020  HPI/Events of Note  Patient off EndoTool earlier in shift. Blood glucose = 154.  eICU Interventions  Plan: 1. Decrease D5 LR IV infusion to 75 mL/hour.      Intervention Category Major Interventions: Hyperglycemia - active titration of insulin therapy  Lenell Antu 09/16/2020, 12:47 AM

## 2020-09-16 NOTE — Progress Notes (Signed)
NAME:  Meagan Cummings, MRN:  [id number], DOB:  1955-07-16, LOS: 1 ADMISSION DATE:  09/15/2020, CONSULTATION DATE:  3/28 REFERRING MD:  Dr. Bebe Shaggy, CHIEF COMPLAINT:  DKA   History of Present Illness:  Patient is encephalopathic and/or intubated. Therefore history has been obtained from chart review.  65 year old female with history significant for DM and frequent admissions for DKA, most recently in January of this year to [place]. On 3/28 she was found down buy her son and EMS was called. Upon EMS assessment the patient was cold to the touch and hypotensive. Glucometer read "high". Upon arrival to the ED she was treated for presumed DKA including 3L crystalloid bolus and insulin infusion. She remained hypotensive despite IVF.  Emergency department course complicated by ongoing confusion and pulling IVs. Bair hugger placed for hypothermia.  Multiple metabolic derangements on labs.  Beta hydroxybutyrate greater than 8.  Potassium greater than 7.5 but hemolyzed.  The patient was initiated on DKA protocol and PCCM was asked to admit to the ICU.  Pertinent  Medical History   has a past medical history of Depression and Diabetes mellitus without complication (HCC).   Significant Hospital Events: Including procedures, antibiotic start and stop dates in addition to other pertinent events   . 3/28 admitted to [facility] for DKA.  ICU admission because of ongoing hypotension despite fluids.  Interim History / Subjective:  She denies complaints but remains very fatigued. Off endotool and transitioned to [place] insulin overnight.  Objective   Blood pressure (!) 145/86, pulse 96, temperature 98.3 F (36.8 C), temperature source Axillary, resp. rate 16, weight 55.8 kg, SpO2 98 %.        Intake/Output Summary (Last 24 hours) at 09/16/2020 1449 Last data filed at 09/16/2020 1200 Gross per 24 hour  Intake 2618.86 ml  Output 1000 ml  Net 1618.86 ml   Filed Weights   09/15/20 1932 09/16/20 0342  Weight:  54 kg 55.8 kg    Examination: General: elderly woman laying in bed, sleeping and slightly arousable HENT: [place]/AT, eyes anicteric, oral mucosa moist Lungs: Breathing comfortably on [place], CTAB. Cardiovascular: Regular rate and rhythm, no murmurs Abdomen: Soft, nontender, nondistended Extremities: No edema, no clubbing or cyanosis. Neuro: Lethargic, takes significant stimulation to get her to talk.  Not oriented to place.  Moving all extremities on command.   Labs/imaging that I have personally reviewed  (right click and "Reselect all SmartList Selections" daily)  VBG 7.42/38/  /24 Sodium 149 PCT 0.72 WBC 13.3 Platelets 96   Resolved Hospital Problem list     Assessment & Plan:   Diabetic ketoacidosis-- not completely resolved with persistently elevated beta hydroxybutyrate. History of IDDM and frequent DKA admissions. - increasing insulin detemir to 10 units BID (home dose 20 units)  - SSI PRN -increase D5-LR to facilitate higher insulin doses to get her out of DKA -con't to trend beta hydroxybutyrate; may need to go back on insulin - Will need DM coordinator and TOC involvement to hopefully prevent recurrence   Acute encephalopathy-- not explained by metabolic derangements -brain MRI -ammonia level -VBG -TSH  Shock- resolved: presumably was hypovolemic shock.    - con't maintenance fluids to facilitate treatment of DKA  -con't broad spectrum antibiotics  Leukocytosis: likely reactive due to DKA, but cannot rule out infectious source.  - Empiric ABX for pneumonia - PCT -con't to follow cultures -urine antigens for legionella and pneumococcus sent  AKI: resolved -con't to monitor -strict I/Os  Hyperkalemia; resolved.  COPD -- at baseline - continue home symbicort    Best practice (right click and "Reselect all SmartList Selections" daily)  Diet:  NPO Pain/Anxiety/Delirium protocol (if indicated): No VAP protocol (if indicated): Not indicated DVT prophylaxis:  Subcutaneous Heparin GI prophylaxis: N/A Glucose control:  SSI Yes and Basal insulin Yes Central venous access:  N/A Arterial line:  N/A Foley:  N/A Mobility:  bed rest  PT consulted: N/A Last date of multidisciplinary goals of care discussion []  Code Status:  full code Disposition: ICU  Labs   CBC: Recent Labs  Lab 09/15/20 0309 09/15/20 0908 09/16/20 0845  WBC 25.9*  --  13.3*  NEUTROABS 16.7*  --   --   HGB 11.2* 10.5* 11.3*  HCT 39.3 31.0* 33.1*  MCV 98.3  --  82.1  PLT 184  --  96*    Basic Metabolic Panel: Recent Labs  Lab 09/15/20 0917 09/15/20 1316 09/15/20 1707 09/15/20 1819 09/16/20 0845 09/16/20 1212  NA 141 146* 145 147* 149*  --   K 4.0 3.8 3.9 4.1 4.4  --   CL 111 116* 118* 118* 118*  --   CO2 8* 18* 23 23 21*  --   GLUCOSE 562* 370* 204* 176* 138*  --   BUN 45* 40* 33* 31* 20  --   CREATININE 2.20*  2.44* 1.75* 1.24* 1.19* 0.99  --   CALCIUM 8.3* 8.5* 8.5* 8.6* 8.8*  --   MG  --   --   --   --   --  1.7  PHOS  --   --   --   --   --  1.9*    09/18/20, DO 09/16/20 3:05 PM [facility]  For contact information, see Amion. If no response to pager, please call PCCM consult pager. After hours, 7PM- 7AM, please call Elink.

## 2020-09-16 NOTE — Progress Notes (Signed)
NAME:  Meagan Cummings, MRN:  [id number], DOB:  1956-04-10, LOS: 1 ADMISSION DATE:  09/15/2020, CONSULTATION DATE:  3/28 REFERRING MD:  Dr. Bebe Shaggy, CHIEF COMPLAINT:  DKA   History of Present Illness:  Patient is encephalopathic and/or intubated. Therefore history has been obtained from chart review.  65 year old female with history significant for DM and frequent admissions for DKA, most recently in January of this year to [place]. On 3/28 she was found down buy her son and EMS was called. Upon EMS assessment the patient was cold to the touch and hypotensive. Glucometer read "high". Upon arrival to the ED she was treated for presumed DKA including 3L crystalloid bolus and insulin infusion. She remained hypotensive despite IVF.  Emergency department course complicated by ongoing confusion and pulling IVs. Bair hugger placed for hypothermia.  Multiple metabolic derangements on labs.  Beta hydroxybutyrate greater than 8.  Potassium greater than 7.5 but hemolyzed.  The patient was initiated on DKA protocol and PCCM was asked to admit to the ICU.  Pertinent  Medical History   Past Medical History:  Diagnosis Date  . Depression   . Diabetes mellitus without complication (HCC)   COPD  Significant Hospital Events: Including procedures, antibiotic start and stop dates in addition to other pertinent events   . 3/28 admitted to [facility] for DKA.  ICU admission because of ongoing hypotension despite fluids.  Interim History / Subjective:  Patient unable to tolerate MRI last night Hypoglycemic episode overnight with blood sugar of 33, recieved 1 amp D50   More awake this morning  Objective   Blood pressure 108/65, pulse 94, temperature 98.4 F (36.9 C), temperature source Oral, resp. rate 18, weight 55.8 kg, SpO2 97 %.        Intake/Output Summary (Last 24 hours) at 09/16/2020 0745 Last data filed at 09/16/2020 0108 Gross per 24 hour  Intake 3072.04 ml  Output 800 ml  Net 2272.04 ml   Filed  Weights   09/15/20 1932 09/16/20 0342  Weight: 54 kg 55.8 kg    Examination: General: Elderly woman laying comfortably in bed. NAD. HENT: [place]/AT. PERRLA.  Lungs: CTAB. No wheezing or rales. Cardiovascular: RRR. No m/r/g Abdomen: Soft. NT/ND. Normal BS.  Extremities: Well-perfused. Normal ROM Neuro: Alert and oriented x3. Able to tell me her childrens' names, her DOB, full name and where she was born. Strength 4/5 in all extremities. Normal sensation. No focal deficits.    Labs/imaging that I havepersonally reviewed  (right click and "Reselect all SmartList Selections" daily)  Hemoglobin 10.1, WBC 6.7, plt 110, glucose 108-318, creatinine 0.75, BUN 6, beta hydroxybutyric acid 0.08, Mag 1.8, Phos 2.0  Resolved Hospital Problem list     Assessment & Plan:   DKA IDDM, uncontrolled Patient with uncontrolled IDDM and multiple hospitalizations for DKA found to have DKA on admission. Likely 2/2 medication adherence. Son states patient went away with friends over the weekend and did not take her insulin. She now has a legal guardian due to incompetence. DKA resolved, gap closed and blood sugar below 200 in the morning. CBG up to 318 in the afternoon after insulin was discontinued overnight.  P: --Restart Lantus 10 u BID --Novolog 10 u x1 dose --Continue SSI --Continue D5LR @ 150 mL/hr --Keep K+ above 4 --CBG monitoring --Trend BMP --Consider TOC/Diabetes educator consult  Acute encephalopathy, improved According to son, patient has been slightly altered since she was admitted to a hospital in [place] last month for sepsis and DKA. CT head  did not show any acute intracranial abnormalities. Patient unable to tolerate MRI brain last night. No focal deficits on neuro exam. Patient more alert and awake this morning. SLT recommending MBS. --D/c MRI brain --PT/OT eval and treat --Pending MBS --Keep MAP above 65  Shock, resolved Sepsis rule out Likely hypovolumic in the setting of  DKA. Patient found to be hypotensive on admission with leukocytosis, WBC of 25.9 but no source of infection. Leukocytosis likely 2/2 to her DKA but will also consider an infectious etiology. CXR unremarkable. BP improved overnight with sBP in the 110s-140s.  --IV fluid resuscitation --Blood culture no growth in 2 days --Urine culture pending --MAP goal 65 --Continue IV abx for now, can transition to oral if pt pass MBS  Leukocytosis, resolved Resolved this morning with WBC of 6.7. Patient remains afebrile. Likely reactive in the setting of DKA.  --Daily CBC --Monitor fever curve --Consider discontinuing abx if patient remains afebrile  AKI Improved after IV fluid and management of DKA. Creatinine back down to 0.75. Baseline around 0.7. --Continue IVF --Daily BMP  Hypomagnesia Hyponatremia Mag improved but still low at 1.8 and phosphorus slightly up to 2.0 in the setting DKA and AKI.  --Continue repleting Mag and phos --Trend Mag and phos  COPD  Chronic and stable. Lung exam normal. Currently satting well on room air.  --Continue home symbicort   Bipolar Disorder PTSD GAD See Dr. Baldo Daub at [facility]. Patient also has a hx of psychosis Home meds includes Effexor, Seroquel and Abilify --Consider restarting home meds when patient passes MBS and metal status improves.   Best practice (right click and "Reselect all SmartList Selections" daily)  Diet:  Oral Pain/Anxiety/Delirium protocol (if indicated): No VAP protocol (if indicated): Not indicated DVT prophylaxis: Subcutaneous Heparin GI prophylaxis: N/A Glucose control:  Insulin gtt Central venous access:  N/A Arterial line:  N/A Foley:  N/A Mobility:  bed rest  PT consulted: N/A Last date of multidisciplinary goals of care discussion []  Code Status:  full code Disposition: ICU  Labs   CBC: Recent Labs  Lab 09/15/20 0309 09/15/20 0908  WBC 25.9*  --   NEUTROABS 16.7*  --   HGB 11.2* 10.5*  HCT 39.3 31.0*   MCV 98.3  --   PLT 184  --     Basic Metabolic Panel: Recent Labs  Lab 09/15/20 0309 09/15/20 0908 09/15/20 0917 09/15/20 1316 09/15/20 1707 09/15/20 1819  NA 137 143 141 146* 145 147*  K >7.5* 3.6 4.0 3.8 3.9 4.1  CL 99  --  111 116* 118* 118*  CO2 <7*  --  8* 18* 23 23  GLUCOSE 1,065*  --  562* 370* 204* 176*  BUN 53*  --  45* 40* 33* 31*  CREATININE 2.90*  --  2.20*  2.44* 1.75* 1.24* 1.19*  CALCIUM 8.9  --  8.3* 8.5* 8.5* 8.6*   GFR: Estimated Creatinine Clearance: 38.3 mL/min (A) (by C-G formula based on SCr of 1.19 mg/dL (H)). Recent Labs  Lab 09/15/20 0309 09/15/20 0420 09/15/20 0917 09/15/20 1316 09/16/20 0253  PROCALCITON  --   --  0.75  --  0.72  WBC 25.9*  --   --   --   --   LATICACIDVEN  --  6.8* 3.8* 2.5*  --     Liver Function Tests: No results for input(s): AST, ALT, ALKPHOS, BILITOT, PROT, ALBUMIN in the last 168 hours. No results for input(s): LIPASE, AMYLASE in the last 168 hours. No results for  input(s): AMMONIA in the last 168 hours.  ABG    Component Value Date/Time   PHART 7.208 (L) 09/15/2020 0908   PCO2ART 19.5 (LL) 09/15/2020 0908   PO2ART 114 (H) 09/15/2020 0908   HCO3 7.7 (L) 09/15/2020 0908   TCO2 8 (L) 09/15/2020 0908   ACIDBASEDEF 18.0 (H) 09/15/2020 0908   O2SAT 97.0 09/15/2020 0908     Coagulation Profile: No results for input(s): INR, PROTIME in the last 168 hours.  Cardiac Enzymes: Recent Labs  Lab 09/15/20 0917  CKTOTAL 141    HbA1C: Hgb A1c MFr Bld  Date/Time Value Ref Range Status  09/15/2020 09:17 AM 11.3 (H) 4.8 - 5.6 % Final    Comment:    (NOTE) Pre diabetes:          5.7%-6.4%  Diabetes:              >6.4%  Glycemic control for   <7.0% adults with diabetes   05/01/2020 03:24 AM 10.9 (H) 4.8 - 5.6 % Final    Comment:    (NOTE) Pre diabetes:          5.7%-6.4%  Diabetes:              >6.4%  Glycemic control for   <7.0% adults with diabetes     CBG: Recent Labs  Lab 09/15/20 2002  09/15/20 2202 09/15/20 2306 09/15/20 2359 09/16/20 0325  GLUCAP 141* 186* 170* 154* 102*    Review of Systems:   Patient is encephalopathic and/or intubated. Therefore history has been obtained from chart review.   Past Medical History:  She,  has a past medical history of Depression and Diabetes mellitus without complication (HCC).   Surgical History:   Past Surgical History:  Procedure Laterality Date  . ABDOMINAL HYSTERECTOMY       Social History:   reports that she has been smoking cigarettes. She has been smoking about 0.50 packs per day. She has never used smokeless tobacco. She reports current alcohol use of about 1.0 standard drink of alcohol per week. She reports that she does not use drugs.   Family History:  Her family history is not on file.   Allergies Allergies  Allergen Reactions  . Other Rash    Pt gets rash on face and chest from cat and dog hair     Home Medications  Prior to Admission medications   Medication Sig Start Date End Date Taking? Authorizing Provider  albuterol (VENTOLIN HFA) 108 (90 Base) MCG/ACT inhaler Inhale 2 puffs into the lungs every 6 (six) hours as needed for wheezing. 02/07/20   [provider]  ARIPiprazole (ABILIFY) 2 MG tablet Take 2 mg by mouth daily. 03/31/20   [provider]  atorvastatin (LIPITOR) 20 MG tablet Take 20 mg by mouth daily.    [provider]  budesonide-formoterol (SYMBICORT) 160-4.5 MCG/ACT inhaler Inhale 2 puffs into the lungs 2 (two) times daily.    [provider]  cetirizine (ZYRTEC) 10 MG tablet Take 10 mg by mouth daily.    [provider]  DULoxetine (CYMBALTA) 60 MG capsule Take 60 mg by mouth daily. 12/04/19   [provider]  fludrocortisone (FLORINEF) 0.1 MG tablet Take 0.1 mg by mouth 2 (two) times daily.    [provider]  fluticasone (FLONASE) 50 MCG/ACT nasal spray Place 1 spray into both nostrils daily. Patient not taking: Reported  on 12/26/2019 12/26/14   Albertine Grates, MD  gabapentin (NEURONTIN) 600 MG tablet Take 1  tablet (600 mg total) by mouth 3 (three) times daily. 05/03/20   Swayze, Ava, DO  glucose blood (ACCU-CHEK AVIVA PLUS) test strip 1 each 4 (four) times daily. 12/12/14   [provider]  insulin aspart (NOVOLOG FLEXPEN) 100 UNIT/ML FlexPen Before each meal 3 times a day, 140-199 - 1 units, 200-250 - 3 units, 251-299 - 5 units,  300-349 - 6 units,  350 or above 8 units. Insulin PEN if approved, provide syringes and needles if needed. 01/01/20   Yvette RackAgyei, Obed K, MD  Insulin Glargine (BASAGLAR KWIKPEN) 100 UNIT/ML Inject 0.25 mLs (25 Units total) into the skin daily. 01/01/20 03/31/20  Yvette RackAgyei, Obed K, MD  QUEtiapine (SEROQUEL) 100 MG tablet Take 100 mg by mouth at bedtime. 12/17/19   [provider]  senna (SENOKOT) 8.6 MG tablet Take 1 tablet by mouth 2 (two) times daily as needed for constipation. 03/31/20   [provider]  traZODone (DESYREL) 50 MG tablet Take 50 mg by mouth at bedtime as needed for sleep. 03/31/20   [provider]     Critical care time: 925

## 2020-09-17 ENCOUNTER — Inpatient Hospital Stay (HOSPITAL_COMMUNITY): Payer: Medicare Other

## 2020-09-17 DIAGNOSIS — E1110 Type 2 diabetes mellitus with ketoacidosis without coma: Secondary | ICD-10-CM | POA: Diagnosis not present

## 2020-09-17 DIAGNOSIS — G9340 Encephalopathy, unspecified: Secondary | ICD-10-CM | POA: Diagnosis not present

## 2020-09-17 LAB — BASIC METABOLIC PANEL
Anion gap: 6 (ref 5–15)
BUN: 6 mg/dL — ABNORMAL LOW (ref 8–23)
CO2: 26 mmol/L (ref 22–32)
Calcium: 8.5 mg/dL — ABNORMAL LOW (ref 8.9–10.3)
Chloride: 111 mmol/L (ref 98–111)
Creatinine, Ser: 0.75 mg/dL (ref 0.44–1.00)
GFR, Estimated: >60 mL/min (ref >60)
Glucose, Bld: 129 mg/dL — ABNORMAL HIGH (ref 70–99)
Potassium: 3 mmol/L — ABNORMAL LOW (ref 3.5–5.1)
Sodium: 143 mmol/L (ref 135–145)

## 2020-09-17 LAB — URINE CULTURE: Culture: <10000 — AB

## 2020-09-17 LAB — CBC
HCT: 30.5 % — ABNORMAL LOW (ref 36.0–46.0)
Hemoglobin: 10.1 g/dL — ABNORMAL LOW (ref 12.0–15.0)
MCH: 28.6 pg (ref 26.0–34.0)
MCHC: 33.1 g/dL (ref 30.0–36.0)
MCV: 86.4 fL (ref 80.0–100.0)
Platelets: 110 10*3/uL — ABNORMAL LOW (ref 150–400)
RBC: 3.53 MIL/uL — ABNORMAL LOW (ref 3.87–5.11)
RDW: 14.4 % (ref 11.5–15.5)
WBC: 6.7 10*3/uL (ref 4.0–10.5)
nRBC: 0 % (ref 0.0–0.2)

## 2020-09-17 LAB — GLUCOSE, CAPILLARY
Glucose-Capillary: 33 mg/dL — CL (ref 70–99)
Glucose-Capillary: 154 mg/dL — ABNORMAL HIGH (ref 70–99)
Glucose-Capillary: 108 mg/dL — ABNORMAL HIGH (ref 70–99)
Glucose-Capillary: 318 mg/dL — ABNORMAL HIGH (ref 70–99)
Glucose-Capillary: 404 mg/dL — ABNORMAL HIGH (ref 70–99)
Glucose-Capillary: 393 mg/dL — ABNORMAL HIGH (ref 70–99)
Glucose-Capillary: 298 mg/dL — ABNORMAL HIGH (ref 70–99)
Glucose-Capillary: 171 mg/dL — ABNORMAL HIGH (ref 70–99)
Glucose-Capillary: 157 mg/dL — ABNORMAL HIGH (ref 70–99)
Glucose-Capillary: 164 mg/dL — ABNORMAL HIGH (ref 70–99)

## 2020-09-17 LAB — PROCALCITONIN: Procalcitonin: 0.25 ng/mL

## 2020-09-17 LAB — BETA-HYDROXYBUTYRIC ACID: Beta-Hydroxybutyric Acid: 0.08 mmol/L (ref 0.05–0.27)

## 2020-09-17 LAB — PHOSPHORUS: Phosphorus: 2 mg/dL — ABNORMAL LOW (ref 2.5–4.6)

## 2020-09-17 LAB — MAGNESIUM: Magnesium: 1.8 mg/dL (ref 1.7–2.4)

## 2020-09-17 MED ORDER — MAGNESIUM SULFATE 2 GM/50ML IV SOLN
2.0000 g | Freq: Once | INTRAVENOUS | Status: AC
  Administered 2020-09-17: 2 g via INTRAVENOUS
  Filled 2020-09-17: qty 50

## 2020-09-17 MED ORDER — ACETAMINOPHEN 325 MG PO TABS
650.0000 mg | ORAL_TABLET | Freq: Four times a day (QID) | ORAL | Status: DC | PRN
  Administered 2020-09-17: 650 mg via ORAL
  Filled 2020-09-17: qty 2

## 2020-09-17 MED ORDER — INSULIN GLARGINE 100 UNIT/ML ~~LOC~~ SOLN
10.0000 [IU] | Freq: Every day | SUBCUTANEOUS | Status: DC
  Filled 2020-09-17: qty 0.1

## 2020-09-17 MED ORDER — THIAMINE HCL 100 MG PO TABS
100.0000 mg | ORAL_TABLET | Freq: Every day | ORAL | Status: DC
  Administered 2020-09-18: 100 mg via ORAL
  Filled 2020-09-17: qty 1

## 2020-09-17 MED ORDER — AMOXICILLIN-POT CLAVULANATE 875-125 MG PO TABS
1.0000 | ORAL_TABLET | Freq: Two times a day (BID) | ORAL | Status: DC
  Administered 2020-09-18 (×2): 1 via ORAL
  Filled 2020-09-17 (×2): qty 1

## 2020-09-17 MED ORDER — POTASSIUM PHOSPHATES 15 MMOLE/5ML IV SOLN
15.0000 mmol | Freq: Once | INTRAVENOUS | Status: AC
  Administered 2020-09-17: 15 mmol via INTRAVENOUS
  Filled 2020-09-17: qty 5

## 2020-09-17 MED ORDER — INSULIN ASPART 100 UNIT/ML ~~LOC~~ SOLN
0.0000 [IU] | Freq: Three times a day (TID) | SUBCUTANEOUS | Status: DC
  Administered 2020-09-17: 2 [IU] via SUBCUTANEOUS
  Administered 2020-09-18: 5 [IU] via SUBCUTANEOUS
  Administered 2020-09-18: 2 [IU] via SUBCUTANEOUS
  Administered 2020-09-18: 3 [IU] via SUBCUTANEOUS

## 2020-09-17 MED ORDER — RESOURCE THICKENUP CLEAR PO POWD
ORAL | Status: DC | PRN
  Filled 2020-09-17: qty 125

## 2020-09-17 MED ORDER — POTASSIUM CHLORIDE 20 MEQ PO PACK
40.0000 meq | PACK | Freq: Once | ORAL | Status: AC
  Administered 2020-09-17: 40 meq via ORAL
  Filled 2020-09-17: qty 2

## 2020-09-17 MED ORDER — INSULIN GLARGINE 100 UNIT/ML ~~LOC~~ SOLN
10.0000 [IU] | Freq: Two times a day (BID) | SUBCUTANEOUS | Status: DC
  Administered 2020-09-17 – 2020-09-18 (×4): 10 [IU] via SUBCUTANEOUS
  Filled 2020-09-17 (×8): qty 0.1

## 2020-09-17 MED ORDER — POTASSIUM CHLORIDE 10 MEQ/100ML IV SOLN
10.0000 meq | INTRAVENOUS | Status: AC
  Administered 2020-09-17 (×5): 10 meq via INTRAVENOUS
  Filled 2020-09-17 (×5): qty 100

## 2020-09-17 MED ORDER — INSULIN ASPART 100 UNIT/ML ~~LOC~~ SOLN
5.0000 [IU] | Freq: Once | SUBCUTANEOUS | Status: AC
  Administered 2020-09-17: 5 [IU] via SUBCUTANEOUS

## 2020-09-17 MED ORDER — INSULIN ASPART 100 UNIT/ML ~~LOC~~ SOLN: 10.0000 [IU] | Freq: Once | SUBCUTANEOUS | Status: DC

## 2020-09-17 NOTE — Progress Notes (Signed)
Took patient to MRI patient could not tolerate MRI scan almost immediatly began to crawl out saying she couldn't breath. Returned to the unit patient blood glucose 33. Gave 1 amp D50  Called elink rechecked blood glucose 30 mins later 154

## 2020-09-17 NOTE — Evaluation (Signed)
Clinical/Bedside Swallow Evaluation Patient Details  Name: Meagan Cummings MRN: [id number] Date of Birth: 1955-08-20  Today's Date: 09/17/2020 Time: SLP Start Time (ACUTE ONLY): 3875 SLP Stop Time (ACUTE ONLY): 0915 SLP Time Calculation (min) (ACUTE ONLY): 22.97 min  Past Medical History:  Past Medical History:  Diagnosis Date  . Depression   . Diabetes mellitus without complication [facility])    Past Surgical History:  Past Surgical History:  Procedure Laterality Date  . ABDOMINAL HYSTERECTOMY     HPI:  Pt is a 65 year old female with history significant for DM and frequent admissions for DKA. Pt was admitted after being found down by her son on 3/28. Upon EMS assessment the patient was cold to the touch and hypotensive. Upon arrival to the ED pt was treated for presumed DKA including 3L crystalloid bolus and insulin infusion. Pt diagnosed with acute encephalopathy. CXR 3/28: No acute abnormality noted. MBS 05/02/20:  mild oropharyngeal dysphagia characterized by reduced bolus cohesion with premature spillage to the pyriform sinuses, and a pharyngeal delay which resulted in transient penetration (PAS 2) with thin liquids. This depth of penetration is considered to be within normal limits; however, pt reported that she "never sits up here" when she is eating and it is anticipated that depth of laryngeal invasion increases with sub-optimal positioning and ultimately results of intermittent aspiration. A regular texture diet with thin liquids was recommended at this time.   Assessment / Plan / Recommendation Clinical Impression  Pt was seen for bedside swallow evaluation. She reported signs of aspiration with p.o. intake. Oral mechanism exam was [facility]. She presented with four mandibular teeth and reported that she inconsistently wears dentures with meals. She demonstrated significant, but inconsistent coughing with thin liquids and regular texture solids. Coughing was also noted at baseline so  the possibility of some coughing being unrelated is considered. However, due to the extensive nature of her coughing, aspiration is also suspected. A modified barium swallow study is recommended to further assess swallow function. It is scheduled for today at 1130. Pt may have meds crushed with puree and ice chips, until this is completed, but it is recommended that other p.o. intake be deferred. SLP Visit Diagnosis: Dysphagia, unspecified (R13.10)    Aspiration Risk  Mild aspiration risk    Diet Recommendation NPO except meds;Free water protocol after oral care (until MBS completed)   Liquid Administration via: Cup;No straw Medication Administration: Crushed with puree Supervision: Staff to assist with self feeding Compensations: Slow rate;Small sips/bites;Minimize environmental distractions Postural Changes: Seated upright at 90 degrees    Other  Recommendations Oral Care Recommendations: Oral care BID   Follow up Recommendations  (TBD)      Frequency and Duration min 2x/week  1 week       Prognosis Prognosis for Safe Diet Advancement: Good      Swallow Study   General Date of Onset: 09/16/20 HPI: Pt is a 65 year old female with history significant for DM and frequent admissions for DKA. Pt was admitted after being found down by her son on 3/28. Upon EMS assessment the patient was cold to the touch and hypotensive. Upon arrival to the ED pt was treated for presumed DKA including 3L crystalloid bolus and insulin infusion. Pt diagnosed with acute encephalopathy. CXR 3/28: No acute abnormality noted. MBS 05/02/20:  mild oropharyngeal dysphagia characterized by reduced bolus cohesion with premature spillage to the pyriform sinuses, and a pharyngeal delay which resulted in transient penetration (PAS 2) with thin liquids. This  depth of penetration is considered to be within normal limits; however, pt reported that she "never sits up here" when she is eating and it is anticipated that depth  of laryngeal invasion increases with sub-optimal positioning and ultimately results of intermittent aspiration. A regular texture diet with thin liquids was recommended at this time. Type of Study: Bedside Swallow Evaluation Previous Swallow Assessment: See HPI Diet Prior to this Study: Regular;Thin liquids Temperature Spikes Noted: No Respiratory Status: Room air History of Recent Intubation: No Behavior/Cognition: Alert;Cooperative;Pleasant mood;Confused Oral Cavity Assessment: Within Functional Limits Oral Care Completed by SLP: No Oral Cavity - Dentition: Missing dentition (four mandibular teeth only; pt owns dentures and inconsistently wears them for meals) Vision: Functional for self-feeding Self-Feeding Abilities: Needs assist Patient Positioning: Upright in bed;Postural control adequate for testing Baseline Vocal Quality: Normal Volitional Cough: Strong Volitional Swallow: Able to elicit    Oral/Motor/Sensory Function Overall Oral Motor/Sensory Function: Within functional limits   Ice Chips Ice chips: Within functional limits Presentation: Spoon   Thin Liquid Thin Liquid: Impaired Presentation: Straw;Cup Pharyngeal  Phase Impairments: Throat Clearing - Immediate;Throat Clearing - Delayed    Nectar Thick Nectar Thick Liquid: Not tested   Honey Thick Honey Thick Liquid: Not tested   Puree Puree: Within functional limits Presentation: Spoon   Solid     Solid: Impaired Presentation: Self Fed Pharyngeal Phase Impairments: Cough - Immediate;Cough - Delayed     Tyshell Ramberg I. Vear Clock, MS, CCC-SLP Acute Rehabilitation Services Office number [phone] Pager [phone]  Scheryl Marten 09/17/2020,9:33 AM

## 2020-09-17 NOTE — Progress Notes (Signed)
Modified Barium Swallow Progress Note  Patient Details  Name: Meagan Cummings MRN: [id number] Date of Birth: Jan 19, 1956  Today's Date: 09/17/2020  Modified Barium Swallow completed.  Full report located under Chart Review in the Imaging Section.  Brief recommendations include the following:  Clinical Impression  Pt presents with oropharyngeal dysphagia characterized by impaired bolus cohesion and a pharyngeal delay. She demonstrated premature spillage to the pyriform sinuses and the swallow was often triggered with >50% of the bolus at this level. Penetration (PAS 3,5) and sensed aspiration (PAS 7) were intermittently noted with thin liquids. Aspiration before deglutition was secondary to spillover of liquid from the pyriform sinuses into the airway and aspiration after was of previously penetrated material. A chin tuck posture eliminated laryngeal invsaion in some cases and in others facilitated aspiration before deglutition due to liquids spilling over the base of tongue and over the laryngeal surface of the epiglottis. Coughing was partially effective in clearing the airway. A dysphagia 3 diet with nectar thick liquids will be initiated at this time. SLP will follow for dysphagia treatment.   Swallow Evaluation Recommendations       SLP Diet Recommendations: Dysphagia 3 (Mech soft) solids;Nectar thick liquid   Liquid Administration via: Cup;Straw   Medication Administration: Crushed with puree   Supervision: Patient able to self feed   Compensations: Slow rate;Small sips/bites;Minimize environmental distractions   Postural Changes: Seated upright at 90 degrees   Oral Care Recommendations: Oral care BID   Other Recommendations: Order thickener from pharmacy  [place] I. Vear Clock, MS, CCC-SLP Acute Rehabilitation Services Office number [phone] Pager [phone]   Scheryl Marten 09/17/2020,1:49 PM

## 2020-09-17 NOTE — Progress Notes (Signed)
  Speech Language Pathology Treatment: Dysphagia  Patient Details Name: Meagan Cummings MRN: [id number] DOB: 1956/03/14 Today's Date: 09/17/2020 Time: 3295-1884 SLP Time Calculation (min) (ACUTE ONLY): 23.53 min  Assessment / Plan / Recommendation Clinical Impression  Pt was seen for dysphagia treatment. She was alert and cooperative during the session. Pt was educated regarding the results of the modified barium swallow study, diet recommendations, and swallowing precautions. Video recording of the study was used to facilitate education and she verbalized understanding regarding all areas of education. Pt was educated on use of a chin tuck posture and reduced bolus sizes with thin liquids. She reduced bolus sizes without need for significant cueing and no s/sx of aspiration were noted. However, she required consistent prompts to maintain a chin tuck posture. Pt often implemented the chin tuck when sipping the liquid and then lifted her chin to swallow it. All of the pt's questions were answered to her satisfaction and she verbalized understanding as well as agreement with plan of care. SLP will continue to follow pt.    HPI HPI: Pt is a 65 year old female with history significant for DM and frequent admissions for DKA. Pt was admitted after being found down by her son on 3/28. Upon EMS assessment the patient was cold to the touch and hypotensive. Upon arrival to the ED pt was treated for presumed DKA including 3L crystalloid bolus and insulin infusion. Pt diagnosed with acute encephalopathy. CXR 3/28: No acute abnormality noted. MBS 05/02/20:  mild oropharyngeal dysphagia characterized by reduced bolus cohesion with premature spillage to the pyriform sinuses, and a pharyngeal delay which resulted in transient penetration (PAS 2) with thin liquids. This depth of penetration is considered to be within normal limits; however, pt reported that she "never sits up here" when she is eating and it is  anticipated that depth of laryngeal invasion increases with sub-optimal positioning and ultimately results of intermittent aspiration. A regular texture diet with thin liquids was recommended at this time.      SLP Plan  Continue with current plan of care       Recommendations  Diet recommendations: Dysphagia 3 (mechanical soft);Nectar-thick liquid Liquids provided via: Cup;Straw Medication Administration: Crushed with puree Supervision: Staff to assist with self feeding Compensations: Slow rate;Small sips/bites;Minimize environmental distractions Postural Changes and/or Swallow Maneuvers: Seated upright 90 degrees                Oral Care Recommendations: Oral care BID Follow up Recommendations: Outpatient SLP SLP Visit Diagnosis: Dysphagia, oropharyngeal phase (R13.12) Plan: Continue with current plan of care       Money Mckeithan I. Vear Clock, MS, CCC-SLP Acute Rehabilitation Services Office number [phone] Pager [phone]                Scheryl Marten 09/17/2020, 2:43 PM

## 2020-09-17 NOTE — Progress Notes (Signed)
eLink Physician-Brief Progress Note Patient Name: Meagan Cummings DOB: [birth year] MRN: [id number]   Date of Service  09/17/2020  HPI/Events of Note  Nursing request for AM labs.   eICU Interventions  Will order BMP, Mg++ and Phosphorus now     Intervention Category Major Interventions: Other:  Meagan Cummings 09/17/2020, 6:08 AM

## 2020-09-17 NOTE — Progress Notes (Signed)
eLink Physician-Brief Progress Note Patient Name: Meagan Cummings DOB: 03/10/1956 MRN: [id number]   Date of Service  09/17/2020  HPI/Events of Note  Multiple issues: 1. Patient not able to tolerate being in MRI scanner. Nursing resquest for sedation and 2. Hypoglycemia - Blood glucose = 33. Already given D50 and has D5 LR IV infusion running at 150 mL/hour.   eICU Interventions  Plan: 1. Sedation for MRI scan per rounding team in AM. 2. D/C Levemir 10 units [place] Q 12 hours.     Intervention Category Major Interventions: Delirium, psychosis, severe agitation - evaluation and management;Other:  Selyna Klahn Dennard Nip 09/17/2020, 4:19 AM

## 2020-09-17 NOTE — Progress Notes (Signed)
Attempted this patients MRI. Patient immediately began to panic when put into scanner and proceeded to try to crawl out. She stated multiple times that she could not breathe and needed to sit up. At this time, RN did not have meds to give pt. I sent her back for now, we will try again once patient calms back down and meds are ordered.

## 2020-09-18 ENCOUNTER — Encounter (HOSPITAL_COMMUNITY): Payer: Self-pay | Admitting: Pulmonary Disease

## 2020-09-18 ENCOUNTER — Other Ambulatory Visit: Payer: Self-pay

## 2020-09-18 LAB — GLUCOSE, CAPILLARY
Glucose-Capillary: 186 mg/dL — ABNORMAL HIGH (ref 70–99)
Glucose-Capillary: 202 mg/dL — ABNORMAL HIGH (ref 70–99)
Glucose-Capillary: 280 mg/dL — ABNORMAL HIGH (ref 70–99)
Glucose-Capillary: 119 mg/dL — ABNORMAL HIGH (ref 70–99)

## 2020-09-18 LAB — CBC
HCT: 33.9 % — ABNORMAL LOW (ref 36.0–46.0)
Hemoglobin: 11.3 g/dL — ABNORMAL LOW (ref 12.0–15.0)
MCH: 28.4 pg (ref 26.0–34.0)
MCHC: 33.3 g/dL (ref 30.0–36.0)
MCV: 85.2 fL (ref 80.0–100.0)
Platelets: 104 10*3/uL — ABNORMAL LOW (ref 150–400)
RBC: 3.98 MIL/uL (ref 3.87–5.11)
RDW: 14.3 % (ref 11.5–15.5)
WBC: 4.7 10*3/uL (ref 4.0–10.5)
nRBC: 0 % (ref 0.0–0.2)

## 2020-09-18 LAB — PHOSPHORUS: Phosphorus: 2.7 mg/dL (ref 2.5–4.6)

## 2020-09-18 LAB — COMPREHENSIVE METABOLIC PANEL
ALT: 33 U/L (ref 0–44)
AST: 33 U/L (ref 15–41)
Albumin: 3 g/dL — ABNORMAL LOW (ref 3.5–5.0)
Alkaline Phosphatase: 88 U/L (ref 38–126)
Anion gap: 5 (ref 5–15)
BUN: <5 mg/dL — ABNORMAL LOW (ref 8–23)
CO2: 27 mmol/L (ref 22–32)
Calcium: 8.7 mg/dL — ABNORMAL LOW (ref 8.9–10.3)
Chloride: 107 mmol/L (ref 98–111)
Creatinine, Ser: 0.63 mg/dL (ref 0.44–1.00)
GFR, Estimated: >60 mL/min (ref >60)
Glucose, Bld: 207 mg/dL — ABNORMAL HIGH (ref 70–99)
Potassium: 3.6 mmol/L (ref 3.5–5.1)
Sodium: 139 mmol/L (ref 135–145)
Total Bilirubin: 0.8 mg/dL (ref 0.3–1.2)
Total Protein: 5.6 g/dL — ABNORMAL LOW (ref 6.5–8.1)

## 2020-09-18 LAB — T4, FREE: Free T4: 0.97 ng/dL (ref 0.61–1.12)

## 2020-09-18 LAB — MAGNESIUM: Magnesium: 2 mg/dL (ref 1.7–2.4)

## 2020-09-18 MED ORDER — BASAGLAR KWIKPEN 100 UNIT/ML ~~LOC~~ SOPN: 25.0000 [IU] | PEN_INJECTOR | Freq: Every day | SUBCUTANEOUS | 2 refills | Status: DC

## 2020-09-18 MED ORDER — FLUTICASONE PROPIONATE 50 MCG/ACT NA SUSP: 1.0000 | Freq: Every day | NASAL | 2 refills | Status: DC

## 2020-09-18 MED ORDER — THIAMINE HCL 100 MG PO TABS: 100.0000 mg | ORAL_TABLET | Freq: Every day | ORAL | 0 refills | Status: AC

## 2020-09-18 MED ORDER — AMOXICILLIN-POT CLAVULANATE 875-125 MG PO TABS: 1.0000 | ORAL_TABLET | Freq: Two times a day (BID) | ORAL | 0 refills | Status: AC

## 2020-09-18 NOTE — Discharge Summary (Signed)
Physician Discharge Summary  Meagan Cummings ZOX:[id number] DOB: 10-10-1955 DOA: 09/15/2020  PCP: Medicine, [facility]  Admit date: 09/15/2020 Discharge date: 09/18/2020  Admitted From: Home  Discharge disposition: home with Home Health  Recommendations for Outpatient Follow-Up:   . Follow up with your primary care provider in one week.  . Check CBC, BMP, magnesium in the next visit  Discharge Diagnosis:   Principal Problem:   DKA (diabetic ketoacidosis) (HCC)  Discharge Condition: Improved.  Diet recommendation: Low sodium, heart healthy.  Carbohydrate-modified.  Wound care: None.  Code status: Full.   History of Present Illness:   Patient is a 65 year old female with history significant for DM and frequent admissions for DKA, most recently in January of this year to [facility] presented to hospital on 3/28 in unresponsive state.  EMS found the patient on the floor hypothermic and hypotensive.  Blood glucose level on the glucometer read as high.  In the ED, patient was noted to have diabetic ketoacidosis and received 3 L of IV fluids including insulin.  Patient remained encephalopathic and was initially put on bair hugger..  Patient was then admitted to the ICU.  Initial potassium level was 7.5 but was hemolyzed.  Subsequently, patient was considered stable for transfer out of the ICU.  Hospital Course:   Following conditions were addressed during hospitalization as listed below,  Diabetic ketoacidosis.  Resolved.  Multiple hospitalization for the same.  Likely secondary to insulin nonadherence.  She does have legal guardian.  Patient initially was on insulin drip which has been changed to her home insulin regimen on discharge.  Acute metabolic encephalopathy, improved Secondary to diabetic ketoacidosis.  CT head was negative for acute findings.  Patient was at her baseline prior to discharge .  Severe volume depletion hypotension on presentation  secondary to diabetic ketoacidosis.    Leukocytosis was likely thought to be reactive in nature.  Blood cultures were negative.  Possible basal pneumonia.  Patient will be continued on Augmentin on discharge to complete the course.  Leukocytosis, resolved  Acute kidney injury improved after IV fluids.  Creatinine prior to discharge was 3.6.  Hypomagnesemia, Hyponatremia Improved  COPD  We will continue home symbicort   Bipolar Disorder/PTSD/GAD Patient follows up with Dr. Baldo [facility]ub at [facility]. Patient also has a hx of psychosis Home meds includes Effexor, Seroquel and Abilify.  Medications will be resumed on discharge.  Disposition.  At this time, patient is stable for disposition home.  Will need to follow-up with your primary care physician in 1 week.  Medical Consultants:    Pulmonary  Procedures:    None Subjective:   Today, patient and and examined at bedside.  Complains of mild cough.  No shortness of breath.  Not on oxygen.  Discharge Exam:   Vitals:   09/18/20 0500 09/18/20 1436  BP: (!) 140/91 (!) 109/57  Pulse: 87 89  Resp: 18 17  Temp: 98.4 F (36.9 C) 97.9 F (36.6 C)  SpO2: 99% 99%   Vitals:   09/18/20 0001 09/18/20 0316 09/18/20 0500 09/18/20 1436  BP:  (!) 155/98 (!) 140/91 (!) 109/57  Pulse:  84 87 89  Resp:  Temp: 98.6 F (37 C) 98.6 F (37 C) 98.4 F (36.9 C) 97.9 F (36.6 C)  TempSrc: Oral Oral Oral Oral  SpO2:  98% 99% 99%  Weight:       Body mass index is 22.5 kg/m.  General: Alert awake, not  in obvious distress HENT: pupils equally reacting to light,  No scleral pallor or icterus noted. Oral mucosa is moist.  Chest:  Diminished breath sounds bilaterally.  CVS: S1 &S2 heard. No murmur.  Regular rate and rhythm. Abdomen: Soft, nontender, nondistended.  Bowel sounds are heard.   Extremities: No cyanosis, clubbing or edema.  Peripheral pulses are palpable. Psych: Alert, awake and communicative, oriented CNS:   No cranial nerve deficits.  Power equal in all extremities.   Skin: Warm and dry.  No rashes noted.  The results of significant diagnostics from this hospitalization (including imaging, microbiology, ancillary and laboratory) are listed below for reference.     Diagnostic Studies:   DG Abd 1 View  Result Date: 09/16/2020 CLINICAL DATA:  Clearance for MRI EXAM: ABDOMEN - 1 VIEW COMPARISON:  CT 12/24/2019 FINDINGS: Nonobstructed gas pattern. No radiopaque calculi. No metallic density foreign bodies over the visualized portions of the abdomen and pelvis. IMPRESSION: Nonobstructed gas pattern. No metallic density foreign bodies are visualized Electronically Signed   By: Jasmine Pang M.D.   On: 09/16/2020 16:48   CT Head Wo Contrast  Result Date: 09/15/2020 CLINICAL DATA:  Altered mental status EXAM: CT HEAD WITHOUT CONTRAST TECHNIQUE: Contiguous axial images were obtained from the base of the skull through the vertex without intravenous contrast. COMPARISON:  None. FINDINGS: Brain: Normal anatomic configuration. Parenchymal volume loss is commensurate with the patient's age. Mild periventricular white matter changes are present likely reflecting the sequela of small vessel ischemia. No abnormal intra or extra-axial mass lesion or fluid collection. No abnormal mass effect or midline shift. No evidence of acute intracranial hemorrhage or infarct. Ventricular size is normal. Cerebellum unremarkable. Vascular: No asymmetric hyperdense vasculature at the skull base. Skull: Intact Sinuses/Orbits: Paranasal sinuses are clear. Orbits are unremarkable. Other: Mastoid air cells and middle ear cavities are clear. IMPRESSION: No acute intracranial hemorrhage or infarct.  Mild senescent change. Electronically Signed   By: Helyn Numbers MD   On: 09/15/2020 06:28   DG Chest Portable 1 View  Result Date: 09/15/2020 CLINICAL DATA:  Altered mental status and hyperglycemia EXAM: PORTABLE CHEST 1 VIEW COMPARISON:   04/28/2020 FINDINGS: Cardiac shadow is mildly prominent but accentuated by the portable technique. Large skin fold is noted over the midline. Lungs are well aerated bilaterally. Increased density over the right lung is felt to be related to underlying soft tissue prominence. No bony abnormality is seen. IMPRESSION: No acute abnormality noted. Electronically Signed   By: Alcide Clever M.D.   On: 09/15/2020 03:34     Labs:   Basic Metabolic Panel: Recent Labs  Lab 09/15/20 1707 09/15/20 1819 09/16/20 0845 09/16/20 1212 09/17/20 0624 09/18/20 1203  NA 145 147* 149*  --  143 139  K 3.9 4.1 4.4  --  3.0* 3.6  CL 118* 118* 118*  --  111 107  CO2 23 23 21*  --  26 27  GLUCOSE 204* 176* 138*  --  129* 207*  BUN 33* 31* 20  --  6* <5*  CREATININE 1.24* 1.19* 0.99  --  0.75 0.63  CALCIUM 8.5* 8.6* 8.8*  --  8.5* 8.7*  MG  --   --   --  1.7 1.8 2.0  PHOS  --   --   --  1.9* 2.0* 2.7   GFR Estimated Creatinine Clearance: 56.9 mL/min (by C-G formula based on SCr of 0.63 mg/dL). Liver Function Tests: Recent Labs  Lab 09/18/20 1203  AST 33  ALT 33  ALKPHOS 88  BILITOT 0.8  PROT 5.6*  ALBUMIN 3.0*   No results for input(s): LIPASE, AMYLASE in the last 168 hours. Recent Labs  Lab 09/16/20 1212  AMMONIA 35   Coagulation profile No results for input(s): INR, PROTIME in the last 168 hours.  CBC: Recent Labs  Lab 09/15/20 0309 09/15/20 0908 09/16/20 0845 09/17/20 0624 09/18/20 1203  WBC 25.9*  --  13.3* 6.7 4.7  NEUTROABS 16.7*  --   --   --   --   HGB 11.2* 10.5* 11.3* 10.1* 11.3*  HCT 39.3 31.0* 33.1* 30.5* 33.9*  MCV 98.3  --  82.1 86.4 85.2  PLT 184  --  96* 110* 104*   Cardiac Enzymes: Recent Labs  Lab 09/15/20 0917  CKTOTAL 141   BNP: Invalid input(s): POCBNP CBG: Recent Labs  Lab 09/17/20 1532 09/17/20 2207 09/17/20 2249 09/18/20 0824 09/18/20 1217  GLUCAP 171* 157* 164* 186* 202*   D-Dimer No results for input(s): DDIMER in the last 72 hours. Hgb  A1c No results for input(s): HGBA1C in the last 72 hours. Lipid Profile No results for input(s): CHOL, HDL, LDLCALC, TRIG, CHOLHDL, LDLDIRECT in the last 72 hours. Thyroid function studies Recent Labs    09/16/20 1212  TSH 0.263*   Anemia work up No results for input(s): VITAMINB12, FOLATE, FERRITIN, TIBC, IRON, RETICCTPCT in the last 72 hours. Microbiology Recent Results (from the past 240 hour(s))  SARS CORONAVIRUS 2 (TAT 6-24 HRS) Nasopharyngeal Nasopharyngeal Swab     Status: None   Collection Time: 09/15/20  5:10 AM   Specimen: Nasopharyngeal Swab  Result Value Ref Range Status   SARS Coronavirus 2 NEGATIVE NEGATIVE Final    Comment: (NOTE) SARS-CoV-2 target nucleic acids are NOT DETECTED.  The SARS-CoV-2 RNA is generally detectable in upper and lower respiratory specimens during the acute phase of infection. Negative results do not preclude SARS-CoV-2 infection, do not rule out co-infections with other pathogens, and should not be used as the sole basis for treatment or other patient management decisions. Negative results must be combined with clinical observations, patient history, and epidemiological information. The expected result is Negative.  Fact Sheet for Patients: HairSlick.no  Fact Sheet for Healthcare Providers: quierodirigir.com  This test is not yet approved or cleared by the [place] FDA and  has been authorized for detection and/or diagnosis of SARS-CoV-2 by FDA under an Emergency Use Authorization (EUA). This EUA will remain  in effect (meaning this test can be used) for the duration of the COVID-19 declaration under Se ction 564(b)(1) of the Act, 21 U.S.C. section 360bbb-3(b)(1), unless the authorization is terminated or revoked sooner.  Performed at [facility], [address] [place], [place] [address]   MRSA PCR Screening     Status: None   Collection Time: 09/15/20  8:31 AM    Specimen: Nasal Mucosa; Nasopharyngeal  Result Value Ref Range Status   MRSA by PCR NEGATIVE NEGATIVE Final    Comment:        The GeneXpert MRSA Assay (FDA approved for NASAL specimens only), is one component of a comprehensive MRSA colonization surveillance program. It is not intended to diagnose MRSA infection nor to guide or monitor treatment for MRSA infections. Performed at [facility], [address], [place], [place] [address]   Culture, blood (routine x 2)     Status: None (Preliminary result)   Collection Time: 09/15/20  9:09 AM   Specimen: BLOOD  Result  Value Ref Range Status   Specimen Description BLOOD BLOOD LEFT HAND  Final   Special Requests   Final    AEROBIC BOTTLE ONLY Blood Culture results may not be optimal due to an inadequate volume of blood received in culture bottles   Culture   Final    NO GROWTH 3 DAYS Performed at [facility], [address] [place], [place] [address]    Report Status PENDING  Incomplete  Culture, blood (routine x 2)     Status: None (Preliminary result)   Collection Time: 09/15/20  9:17 AM   Specimen: BLOOD  Result Value Ref Range Status   Specimen Description BLOOD LEFT ANTECUBITAL  Final   Special Requests   Final    BOTTLES DRAWN AEROBIC AND ANAEROBIC Blood Culture results may not be optimal due to an inadequate volume of blood received in culture bottles   Culture   Final    NO GROWTH 3 DAYS Performed at [facility], [address] [place], [place] [address]    Report Status PENDING  Incomplete  Urine culture     Status: Abnormal   Collection Time: 09/16/20 12:20 PM   Specimen: Urine, Catheterized  Result Value Ref Range Status   Specimen Description URINE, CATHETERIZED  Final   Special Requests NONE  Final   Culture (A)  Final    <10,000 COLONIES/mL INSIGNIFICANT GROWTH Performed at [facility], [address], [place], [place] [address]    Report Status 09/17/2020 FINAL  Final      Discharge Instructions:   Discharge Instructions    Call MD for:  difficulty breathing, headache or visual disturbances   Complete by: As directed    Call MD for:  persistant nausea and vomiting   Complete by: As directed    Call MD for:  severe uncontrolled pain   Complete by: As directed    Call MD for:  temperature >100.4   Complete by: As directed    Diet Carb Modified   Complete by: As directed    Dysphagia 3 diet.   Discharge instructions   Complete by: As directed    Follow-up with your primary care physician in 1 week.  Continue to take the antibiotics from home to complete the course.  Continue to take insulin at home.   Increase activity slowly   Complete by: As directed      Allergies as of 09/18/2020      Reactions   Other Rash   Pt gets rash on face and chest from cat and dog hair      Medication List    TAKE these medications   Accu-Chek Aviva Plus test strip Generic drug: glucose blood 1 each 4 (four) times daily.   albuterol 108 (90 Base) MCG/ACT inhaler Commonly known as: VENTOLIN HFA Inhale 2 puffs into the lungs every 6 (six) hours as needed for wheezing.   amoxicillin-clavulanate 875-125 MG tablet Commonly known as: AUGMENTIN Take 1 tablet by mouth every 12 (twelve) hours for 3 days. For 5 days   ARIPiprazole 2 MG tablet Commonly known as: ABILIFY Take 2 mg by mouth daily.   atorvastatin 20 MG tablet Commonly known as: LIPITOR Take 20 mg by mouth daily.   Basaglar KwikPen 100 UNIT/ML Inject 25 Units into the skin daily. What changed: how much to take   budesonide-formoterol 160-4.5 MCG/ACT inhaler Commonly known as: SYMBICORT Inhale 2 puffs into the lungs 2 (two) times daily.  cetirizine 10 MG tablet Commonly known as: ZYRTEC Take 10 mg by mouth daily.   docusate sodium 100 MG capsule Commonly known as: COLACE Take 100 mg by mouth 2 (two) times daily.   DULoxetine 60 MG capsule Commonly known as: CYMBALTA Take 60 mg by  mouth daily.   fludrocortisone 0.1 MG tablet Commonly known as: FLORINEF Take 0.1 mg by mouth 2 (two) times daily.   fluticasone 50 MCG/ACT nasal spray Commonly known as: FLONASE Place 1 spray into both nostrils daily.   gabapentin 800 MG tablet Commonly known as: NEURONTIN Take 800 mg by mouth 3 (three) times daily. What changed: Another medication with the same name was removed. Continue taking this medication, and follow the directions you see here.   NovoLOG FlexPen 100 UNIT/ML FlexPen Generic drug: insulin aspart Before each meal 3 times a day, 140-199 - 1 units, 200-250 - 3 units, 251-299 - 5 units,  300-349 - 6 units,  350 or above 8 units. Insulin PEN if approved, provide syringes and needles if needed. What changed:   how much to take  how to take this  when to take this  additional instructions   QUEtiapine 50 MG tablet Commonly known as: SEROQUEL Take 50 mg by mouth at bedtime.   thiamine 100 MG tablet Take 1 tablet (100 mg total) by mouth daily. Start taking on: September 19, 2020   traZODone 50 MG tablet Commonly known as: DESYREL Take 50 mg by mouth at bedtime as needed for sleep.   venlafaxine XR 75 MG 24 hr capsule Commonly known as: EFFEXOR-XR Take 75 mg by mouth daily.            Durable Medical Equipment  (From admission, onward)         Start     Ordered   09/18/20 1058  For home use only DME Tub bench  Once       Comments: Tub transfer bench   09/18/20 1057            Time coordinating discharge: 39 minutes  Signed:  Neisha Hinger  [facility] 09/18/2020, 3:05 PM

## 2020-09-18 NOTE — Progress Notes (Signed)
  Speech Language Pathology Treatment: Dysphagia  Patient Details Name: Meagan Cummings MRN: [id number] DOB: 1956-02-15 Today's Date: 09/18/2020 Time: 9390-3009 SLP Time Calculation (min) (ACUTE ONLY): 16 min  Assessment / Plan / Recommendation Clinical Impression  Followed up for dysphagia intervention including findings of MBSS completed yesterday with diet recommendations and rationale. Pt very pleasant, stated displeasure for use of thickener but understood reasoning for use following education. Trialed ice chips and small sips of thin liquids (water) following diligent oral care. No overt coughing indicated, vocal quality remained clear. Cough noted during 1/3 trials of ice chips. Recommend ice chips and water in between meals following diligent oral care to improve hydration and quality of life. Pt in agreement with plan. Continued ST intervention indicated for hopeful diet advancement as able.    HPI HPI: Pt is a 65 year old female with history significant for DM and frequent admissions for DKA. Pt was admitted after being found down by her son on 3/28. Upon EMS assessment the patient was cold to the touch and hypotensive. Upon arrival to the ED pt was treated for presumed DKA including 3L crystalloid bolus and insulin infusion. Pt diagnosed with acute encephalopathy. CXR 3/28: No acute abnormality noted. MBS 05/02/20:  mild oropharyngeal dysphagia characterized by reduced bolus cohesion with premature spillage to the pyriform sinuses, and a pharyngeal delay which resulted in transient penetration (PAS 2) with thin liquids. Repeat MBSS 09/17/20 revealed episodic sensed aspiration with thin liquids.      SLP Plan  Continue with current plan of care       Recommendations  Diet recommendations: Dysphagia 3 (mechanical soft);Nectar-thick liquid;Other(comment) (frazier water protocol) Liquids provided via: Cup;Straw Medication Administration: Whole meds with puree Supervision: Patient  able to self feed;Full supervision/cueing for compensatory strategies Compensations: Slow rate;Small sips/bites;Minimize environmental distractions Postural Changes and/or Swallow Maneuvers: Seated upright 90 degrees;Upright 30-60 min after meal                Oral Care Recommendations: Oral care prior to ice chip/H20;Oral care BID Follow up Recommendations: Home health SLP;Outpatient SLP SLP Visit Diagnosis: Dysphagia, oropharyngeal phase (R13.12) Plan: Continue with current plan of care       GO                Ardyth Gal MA, CCC-SLP Acute Rehabilitation Services   09/18/2020, 2:28 PM

## 2020-09-18 NOTE — Evaluation (Signed)
Occupational Therapy Evaluation Patient Details Name: Meagan Cummings MRN: [id number] DOB: 1955-08-15 Today's Date: 09/18/2020    History of Present Illness 65 year old female with history significant for DM and frequent admissions for DKA. Pt was admitted after being found down by her son on 3/28. Upon EMS assessment the patient was cold to the touch and hypotensive. Upon arrival to the ED pt was treated for presumed DKA including 3L crystalloid bolus and insulin infusion. Pt diagnosed with acute encephalopathy.   Clinical Impression   Pt admitted with the above diagnoses and presents with below problem list. Pt will benefit from continued acute OT to address the below listed deficits and maximize independence with basic ADLs prior to d/c to venue below. At baseline, pt uses a rollator for mobility, needs assist with tub transfer, endorses h/o several falls. Pt presents with generalized weakness, impaired balance, and some impaired cognition (unclear baseline, no family present). Pt currently needs min A with toilet transfer, min guard to min A with functional mobility and LB ADLs. I suspect she's not too from from her baseline with ADLs. Given pt's h/o frequent falls and readmissions feel she would benefit from either increased home support/services or a setting that would be able to provide 24hr assistance (ie ALF, SNF).      Follow Up Recommendations  Home health OT;Supervision/Assistance - 24 hour (at least initial 24hr supervision)    Equipment Recommendations  Tub/shower bench;3 in 1 bedside commode    Recommendations for Other Services PT consult     Precautions / Restrictions Precautions Precautions: Fall Precaution Comments: h/o a few falls this year. Endorses at times a result of blacking out  Other times she has a few seconds notice with knees starting to buckle. Rollator at baseline. Restrictions Weight Bearing Restrictions: No      Mobility Bed Mobility Overal bed  mobility: Needs Assistance Bed Mobility: Supine to Sit     Supine to sit: Min guard;HOB elevated     General bed mobility comments: Extra time and effort but no physical assist. HOB elevated. Utilized bed rail.    Transfers Overall transfer level: Needs assistance Equipment used: Rolling walker (2 wheeled) Transfers: Sit to/from Stand Sit to Stand: Min assist         General transfer comment: Cues for positioning and sequencing to  stand with rw. Min A to powerup to full stand. Noted to abandon rw prematurely upon approach to sit in recliner.    Balance Overall balance assessment: Needs assistance;History of Falls   Sitting balance-Leahy Scale: Fair     Standing balance support: Bilateral upper extremity supported;During functional activity Standing balance-Leahy Scale: Poor Standing balance comment: needs external support                           ADL either performed or assessed with clinical judgement   ADL Overall ADL's : Needs assistance/impaired Eating/Feeding: Set up;Sitting   Grooming: Set up;Supervision/safety;Sitting   Upper Body Bathing: Set up;Sitting   Lower Body Bathing: Sit to/from stand;Moderate assistance   Upper Body Dressing : Set up;Sitting   Lower Body Dressing: Moderate assistance;Sit to/from stand   Toilet Transfer: Minimal assistance;Ambulation;Comfort height toilet;RW;Grab bars   Toileting- Clothing Manipulation and Hygiene: Min guard;Set up;Minimal assistance;Sitting/lateral lean;Sit to/from stand Toileting - Clothing Manipulation Details (indicate cue type and reason): Min A for sit<>stanc transfer Tub/ Shower Transfer: Tub transfer;Moderate assistance;Ambulation;Shower seat;Rolling walker   Functional mobility during ADLs: Min guard;Rolling walker;Minimal assistance  General ADL Comments: Pt completed bed mobility, in room mobility, toilet transfer simulation including walking to/from bathroom. Min A for sit<>stand transfers.  mostly min guard with occasional  light min A to ambulate with rw. Some cueing for safety.     Vision         Perception     Praxis      Pertinent Vitals/Pain Pain Assessment: Faces Faces Pain Scale: Hurts little more Pain Location: head Pain Descriptors / Indicators: Aching Pain Intervention(s): Monitored during session     Hand Dominance Right   Extremity/Trunk Assessment Upper Extremity Assessment Upper Extremity Assessment: Generalized weakness   Lower Extremity Assessment Lower Extremity Assessment: Defer to PT evaluation;Generalized weakness       Communication Communication Communication: No difficulties   Cognition Arousal/Alertness: Awake/alert Behavior During Therapy: WFL for tasks assessed/performed Overall Cognitive Status: No family/caregiver present to determine baseline cognitive functioning                                 General Comments: STM deficits and difficulty dual tasking at times. Confabulation? some decreased problem solving and slow processing. Some sequencing deficits.   General Comments       Exercises     Shoulder Instructions      Home Living Family/patient expects to be discharged to:: Private residence Living Arrangements: Children Available Help at Discharge: Family;Available PRN/intermittently (son works during the day) Type of Home: Apartment Home Access: Stairs to enter [employer] of Steps: 2 Entrance Stairs-Rails: Right Home Layout: Two level;Bed/bath upstairs Alternate Level Stairs-Number of Steps: flight   Bathroom Shower/Tub: Chief Strategy Officer: Standard     Home Equipment: Environmental consultant - 4 wheels;Cane - single point          Prior Functioning/Environment Level of Independence: Needs assistance  Gait / Transfers Assistance Needed: mainly rollator, struggled to recall the last itme she used [facility]. h/o multiple fall per pt report ADL's / Homemaking Assistance Needed: assist  with tub shower transfer            OT Problem List: Decreased strength;Decreased activity tolerance;Impaired balance (sitting and/or standing);Decreased cognition;Decreased knowledge of use of DME or AE;Decreased knowledge of precautions;Pain      OT Treatment/Interventions: Self-care/ADL training;Therapeutic exercise;Energy conservation;DME and/or AE instruction;Therapeutic activities;Cognitive remediation/compensation;Patient/family education;Balance training    OT Goals(Current goals can be found in the care plan section) Acute Rehab OT Goals Patient Stated Goal: not stated OT Goal Formulation: With patient Time For Goal Achievement: 10/02/20 Potential to Achieve Goals: Good  OT Frequency: Min 2X/week   Barriers to D/C: Decreased caregiver support  son lives with her; he works during the day. No other assist currently available       Co-evaluation              AM-PAC OT "6 Clicks" Daily Activity     Outcome Measure Help from another person eating meals?: None Help from another person taking care of personal grooming?: A Little Help from another person toileting, which includes using toliet, bedpan, or urinal?: A Little Help from another person bathing (including washing, rinsing, drying)?: A Little Help from another person to put on and taking off regular upper body clothing?: None Help from another person to put on and taking off regular lower body clothing?: A Little 6 Click Score: 20   End of Session Equipment Utilized During Treatment: Rolling walker Nurse Communication: Other (comment) (RN present towards  end of session, observed some mobility)  Activity Tolerance: Patient tolerated treatment well Patient left: in chair;with call bell/phone within reach;with chair alarm set;with nursing/sitter in room  OT Visit Diagnosis: Unsteadiness on feet (R26.81);Pain;Muscle weakness (generalized) (M62.81);History of falling (Z91.81);Repeated falls (R29.6);Other symptoms  and signs involving cognitive function                Time: 1610-9604 OT Time Calculation (min): 22 min Charges:  OT General Charges $OT Visit: 1 Visit OT Evaluation $OT Eval Low Complexity: 1 Low  Raynald Kemp, OT Acute Rehabilitation Services Pager: [phone] Office: [phone]   Pilar Grammes 09/18/2020, 10:12 AM

## 2020-09-18 NOTE — Care Management Important Message (Signed)
Important Message  Patient Details  Name: Meagan Cummings MRN: [id number] Date of Birth: [birth year]   Medicare Important Message Given:  Yes     [name] Stefan Church 09/18/2020, 1:12 PM

## 2020-09-18 NOTE — Progress Notes (Addendum)
Inpatient Diabetes Program Recommendations  AACE/ADA: New Consensus Statement on Inpatient Glycemic Control (2015)  Target Ranges:  Prepandial:   less than 140 mg/dL      Peak postprandial:   less than 180 mg/dL (1-2 hours)      Critically ill patients:  140 - 180 mg/dL   Lab Results  Component Value Date   GLUCAP 202 (H) 09/18/2020   HGBA1C 11.3 (H) 09/15/2020    Review of Glycemic Control  Diabetes history: DM 2 Outpatient Diabetes medications: Novolog 7 units bid, Basaglar 20 units Daily Current orders for Inpatient glycemic control:  Lantus 10 units bid Novolog 0-9 units tid  A1c 11.3%  Inpatient Diabetes Program Recommendations:    -  Increase Lantus to 14 units bid  Spoke with pt at bedside regarding glucose control at home and A1c level of 11.3%. Pt reports living with her mom and her mom sometimes helps her. Pt reports having an appt with her PCP on the 28th of this month but will have to reschedule. Told pt what her A1c was and that her gals needs to be around 7 %, a glucose 150 average. Spoke with pt about checking her glucose everyday and if it is >200 all the time to contact her doctor instead of waiting to her next PCP appt.   Pt reports needing a refill on her basaglar at time of d/c.   Thanks,  Christena Deem RN, MSN, BC-ADM Inpatient Diabetes Coordinator Team Pager [phone] (8a-5p)

## 2020-09-18 NOTE — Evaluation (Signed)
Physical Therapy Evaluation Patient Details Name: Meagan Cummings MRN: [id number] DOB: [birth year] Today's Date: 09/18/2020   History of Present Illness  65 year old female with history significant for DM and frequent admissions for DKA. Pt was admitted after being found down by her son on 3/28. Upon EMS assessment the patient was cold to the touch and hypotensive. Upon arrival to the ED pt was treated for presumed DKA including 3L crystalloid bolus and insulin infusion. Pt diagnosed with acute encephalopathy.  Clinical Impression   Pt presents with generalized weakness, impaired standing balance with history of falls, impaired gait, and decreased activity tolerance. Pt to benefit from acute PT to address deficits. Pt ambulated good hallway distance with RW, transitioning to HHA. Pt lives with son who works, PT recommending increased supervision during mobility at d/c and pt states she could have neighbors assist with mobility/ADLs as needed. PT to progress mobility as tolerated, and will continue to follow acutely.      Follow Up Recommendations Home health PT;Supervision for mobility/OOB    Equipment Recommendations  None recommended by PT    Recommendations for Other Services       Precautions / Restrictions Precautions Precautions: Fall Precaution Comments: multiple falls Restrictions Weight Bearing Restrictions: No      Mobility  Bed Mobility Overal bed mobility: Needs Assistance             General bed mobility comments: up in chair upon arrival to room, requests stay up in chair upon PT exit to eat her lunch.    Transfers Overall transfer level: Needs assistance Equipment used: Rolling walker (2 wheeled) Transfers: Sit to/from Stand Sit to Stand: Min guard         General transfer comment: min guard for safety, verbal cuing for hand placement when rising.  Ambulation/Gait Ambulation/Gait assistance: Min assist;Min guard Gait Distance (Feet): 530  Feet Assistive device: Rolling walker (2 wheeled);1 person hand held assist Gait Pattern/deviations: Step-through pattern;Decreased stride length;Trunk flexed;Drifts right/left Gait velocity: decr   General Gait Details: min guard for safety with use of RW, transitioning to HHA requiring more min assist to steady. Pt with L lateral leaning and weaving of gait, especially with distraction (talking, looking L/R)  Stairs            Wheelchair Mobility    Modified Rankin (Stroke Patients Only)       Balance Overall balance assessment: Needs assistance;History of Falls   Sitting balance-Leahy Scale: Fair     Standing balance support: Bilateral upper extremity supported;During functional activity Standing balance-Leahy Scale: Poor Standing balance comment: needs external support                             Pertinent Vitals/Pain Pain Assessment: Faces Faces Pain Scale: No hurt Pain Intervention(s): Monitored during session    Home Living Family/patient expects to be discharged to:: Private residence Living Arrangements: Children Available Help at Discharge: Family;Available PRN/intermittently (son works during the day) Type of Home: Apartment Home Access: Stairs to enter Entrance Stairs-Rails: Right Entrance Stairs-Number of Steps: 2 Home Layout: Two level;Bed/bath upstairs Home Equipment: Walker - 4 wheels;Cane - single point      Prior Function Level of Independence: Needs assistance   Gait / Transfers Assistance Needed: mainly rollator, struggled to recall the last itme she used [facility]. h/o multiple fall per pt report  ADL's / Homemaking Assistance Needed: assist with tub shower transfer  Hand Dominance   Dominant Hand: Right    Extremity/Trunk Assessment   Upper Extremity Assessment Upper Extremity Assessment: Defer to OT evaluation    Lower Extremity Assessment Lower Extremity Assessment: Generalized weakness    Cervical / Trunk  Assessment Cervical / Trunk Assessment: Normal  Communication   Communication: No difficulties  Cognition Arousal/Alertness: Awake/alert Behavior During Therapy: WFL for tasks assessed/performed Overall Cognitive Status: No family/caregiver present to determine baseline cognitive functioning                                 General Comments: Suspect pt is close to baseline, lacks insight into deficits and has poor safety awareness      General Comments      Exercises     Assessment/Plan    PT Assessment Patient needs continued PT services  PT Problem List Decreased strength;Decreased mobility;Decreased safety awareness;Decreased activity tolerance;Decreased balance;Decreased knowledge of use of DME;Decreased coordination       PT Treatment Interventions DME instruction;Therapeutic activities;Gait training;Therapeutic exercise;Patient/family education;Balance training;Neuromuscular re-education;Functional mobility training    PT Goals (Current goals can be found in the Care Plan section)  Acute Rehab PT Goals PT Goal Formulation: With patient Time For Goal Achievement: 10/02/20 Potential to Achieve Goals: Good    Frequency Min 3X/week   Barriers to discharge        Co-evaluation               AM-PAC PT "6 Clicks" Mobility  Outcome Measure Help needed turning from your back to your side while in a flat bed without using bedrails?: A Little Help needed moving from lying on your back to sitting on the side of a flat bed without using bedrails?: A Little Help needed moving to and from a bed to a chair (including a wheelchair)?: A Little Help needed standing up from a chair using your arms (e.g., wheelchair or bedside chair)?: A Little Help needed to walk in hospital room?: A Little Help needed climbing 3-5 steps with a railing? : A Little 6 Click Score: 18    End of Session Equipment Utilized During Treatment: Gait belt Activity Tolerance: Patient  tolerated treatment well Patient left: with call bell/phone within reach;with chair alarm set;in chair Nurse Communication: Mobility status PT Visit Diagnosis: Unsteadiness on feet (R26.81);Muscle weakness (generalized) (M62.81)    Time: 4034-7425 PT Time Calculation (min) (ACUTE ONLY): 17 min   Charges:   PT Evaluation $PT Eval Low Complexity: 1 Low         Oluwateniola Leitch S, PT Acute Rehabilitation Services Pager [phone]  Office [phone]   Truddie Coco 09/18/2020, 3:19 PM

## 2020-09-18 NOTE — Progress Notes (Incomplete)
PROGRESS NOTE  Meagan Cummings QPR:[id number] DOB: [birth year] DOA: 09/15/2020 PCP: Medicine, [facility]   LOS: 3 days   Brief narrative:   Assessment/Plan:  Principal Problem:   DKA (diabetic ketoacidosis) (HCC)   ***  DVT prophylaxis: heparin injection 5,000 Units Start: 09/15/20 1400 SCDs Start: 09/15/20 0650 SCDs Start: 09/15/20 9935    Code Status:  ***  Family Communication: ***   Status is: Inpatient  {Inpatient:23812}  Dispo: The patient is from: {From:23814}              Anticipated d/c is to: {To:23815}              Patient currently {Medically stable:23817}   Difficult to place patient {Yes/No:25151}       Consultants:  ***  Procedures:  ***  Anti-infectives:  . ***  Anti-infectives (From admission, onward)   Start     Dose/Rate Route Frequency Ordered Stop   09/18/20 1000  amoxicillin-clavulanate (AUGMENTIN) 875-125 MG per tablet 1 tablet        1 tablet Oral Every 12 hours 09/17/20 1413 09/20/20 0959   09/18/20 0000  amoxicillin-clavulanate (AUGMENTIN) 875-125 MG tablet        1 tablet Oral Every 12 hours 09/18/20 1456 09/21/20 2359   09/15/20 0700  cefTRIAXone (ROCEPHIN) 1 g in sodium chloride 0.9 % 100 mL IVPB  Status:  Discontinued        1 g 200 mL/hr over 30 Minutes Intravenous Every 24 hours 09/15/20 0656 09/17/20 1413   09/15/20 0700  azithromycin (ZITHROMAX) 500 mg in sodium chloride 0.9 % 250 mL IVPB  Status:  Discontinued        500 mg 250 mL/hr over 60 Minutes Intravenous Every 24 hours 09/15/20 0656 09/17/20 1413       Subjective: Today, patient was seen and examined at bedside.   Objective: Vitals:   09/18/20 0500 09/18/20 1436  BP: (!) 140/91 (!) 109/57  Pulse: 87 89  Resp: 18 17  Temp: 98.4 F (36.9 C) 97.9 F (36.6 C)  SpO2: 99% 99%    Intake/Output Summary (Last 24 hours) at 09/18/2020 1618 Last data filed at 09/18/2020 1006 Gross per 24 hour  Intake 500.68 ml  Output 1001 ml   Net -500.32 ml   Filed Weights   09/15/20 1932 09/16/20 0342 09/17/20 0500  Weight: 54 kg 55.8 kg 55.8 kg   Body mass index is 22.5 kg/m.   Physical Exam: GENERAL: Patient is alert awake and oriented. Not in obvious distress. HENT: No scleral pallor or icterus. Pupils equally reactive to light. Oral mucosa is moist NECK: is supple, no gross swelling noted. CHEST: Clear to auscultation. No crackles or wheezes.  Diminished breath sounds bilaterally. CVS: S1 and S2 heard, no murmur. Regular rate and rhythm.  ABDOMEN: Soft, non-tender, bowel sounds are present. EXTREMITIES: No edema. CNS: Cranial nerves are intact. No focal motor deficits. SKIN: warm and dry without rashes.  Data Review: I have personally reviewed the following laboratory data and studies,  CBC: Recent Labs  Lab 09/15/20 0309 09/15/20 0908 09/16/20 0845 09/17/20 0624 09/18/20 1203  WBC 25.9*  --  13.3* 6.7 4.7  NEUTROABS 16.7*  --   --   --   --   HGB 11.2* 10.5* 11.3* 10.1* 11.3*  HCT 39.3 31.0* 33.1* 30.5* 33.9*  MCV 98.3  --  82.1 86.4 85.2  PLT 184  --  96* 110* 104*   Basic Metabolic Panel: Recent Labs  Lab  09/15/20 1707 09/15/20 1819 09/16/20 0845 09/16/20 1212 09/17/20 0624 09/18/20 1203  NA 145 147* 149*  --  143 139  K 3.9 4.1 4.4  --  3.0* 3.6  CL 118* 118* 118*  --  111 107  CO2 23 23 21*  --  26 27  GLUCOSE 204* 176* 138*  --  129* 207*  BUN 33* 31* 20  --  6* <5*  CREATININE 1.24* 1.19* 0.99  --  0.75 0.63  CALCIUM 8.5* 8.6* 8.8*  --  8.5* 8.7*  MG  --   --   --  1.7 1.8 2.0  PHOS  --   --   --  1.9* 2.0* 2.7   Liver Function Tests: Recent Labs  Lab 09/18/20 1203  AST 33  ALT 33  ALKPHOS 88  BILITOT 0.8  PROT 5.6*  ALBUMIN 3.0*   No results for input(s): LIPASE, AMYLASE in the last 168 hours. Recent Labs  Lab 09/16/20 1212  AMMONIA 35   Cardiac Enzymes: Recent Labs  Lab 09/15/20 0917  CKTOTAL 141   BNP (last 3 results) No results for input(s): BNP in the last  8760 hours.  ProBNP (last 3 results) No results for input(s): PROBNP in the last 8760 hours.  CBG: Recent Labs  Lab 09/17/20 1532 09/17/20 2207 09/17/20 2249 09/18/20 0824 09/18/20 1217  GLUCAP 171* 157* 164* 186* 202*   Recent Results (from the past 240 hour(s))  SARS CORONAVIRUS 2 (TAT 6-24 HRS) Nasopharyngeal Nasopharyngeal Swab     Status: None   Collection Time: 09/15/20  5:10 AM   Specimen: Nasopharyngeal Swab  Result Value Ref Range Status   SARS Coronavirus 2 NEGATIVE NEGATIVE Final    Comment: (NOTE) SARS-CoV-2 target nucleic acids are NOT DETECTED.  The SARS-CoV-2 RNA is generally detectable in upper and lower respiratory specimens during the acute phase of infection. Negative results do not preclude SARS-CoV-2 infection, do not rule out co-infections with other pathogens, and should not be used as the sole basis for treatment or other patient management decisions. Negative results must be combined with clinical observations, patient history, and epidemiological information. The expected result is Negative.  Fact Sheet for Patients: HairSlick.nohttps://www.fda.gov/media/138098/download  Fact Sheet for Healthcare Providers: quierodirigir.comhttps://www.fda.gov/media/138095/download  This test is not yet approved or cleared by the [place] FDA and  has been authorized for detection and/or diagnosis of SARS-CoV-2 by FDA under an Emergency Use Authorization (EUA). This EUA will remain  in effect (meaning this test can be used) for the duration of the COVID-19 declaration under Se ction 564(b)(1) of the Act, 21 U.S.C. section 360bbb-3(b)(1), unless the authorization is terminated or revoked sooner.  Performed at [facility], [address] [place], [place] [address]   MRSA PCR Screening     Status: None   Collection Time: 09/15/20  8:31 AM   Specimen: Nasal Mucosa; Nasopharyngeal  Result Value Ref Range Status   MRSA by PCR NEGATIVE NEGATIVE Final    Comment:        The  GeneXpert MRSA Assay (FDA approved for NASAL specimens only), is one component of a comprehensive MRSA colonization surveillance program. It is not intended to diagnose MRSA infection nor to guide or monitor treatment for MRSA infections. Performed at [facility], [address] [place], [place] [address]   Culture, blood (routine x 2)     Status: None (Preliminary result)   Collection Time: 09/15/20  9:09 AM   Specimen: BLOOD  Result  Value Ref Range Status   Specimen Description BLOOD BLOOD LEFT HAND  Final   Special Requests   Final    AEROBIC BOTTLE ONLY Blood Culture results may not be optimal due to an inadequate volume of blood received in culture bottles   Culture   Final    NO GROWTH 3 DAYS Performed at [facility], [address] [place], [place] [address]    Report Status PENDING  Incomplete  Culture, blood (routine x 2)     Status: None (Preliminary result)   Collection Time: 09/15/20  9:17 AM   Specimen: BLOOD  Result Value Ref Range Status   Specimen Description BLOOD LEFT ANTECUBITAL  Final   Special Requests   Final    BOTTLES DRAWN AEROBIC AND ANAEROBIC Blood Culture results may not be optimal due to an inadequate volume of blood received in culture bottles   Culture   Final    NO GROWTH 3 DAYS Performed at [facility], [address], [place], [place] [address]    Report Status PENDING  Incomplete  Urine culture     Status: Abnormal   Collection Time: 09/16/20 12:20 PM   Specimen: Urine, Catheterized  Result Value Ref Range Status   Specimen Description URINE, CATHETERIZED  Final   Special Requests NONE  Final   Culture (A)  Final    <10,000 COLONIES/mL INSIGNIFICANT GROWTH Performed at [facility], [address] [place], [place] [address]    Report Status 09/17/2020 FINAL  Final     Studies: DG Swallowing Func-Speech Pathology  Result Date: 09/17/2020 Objective Swallowing Evaluation: Type of Study: MBS-Modified  Barium Swallow Study  Patient Details Name: Meagan Cummings MRN: [id number] Date of Birth: 1956/02/26 Today's Date: 09/17/2020 Time: SLP Start Time (ACUTE ONLY): 1151 -SLP Stop Time (ACUTE ONLY): 1203 SLP Time Calculation (min) (ACUTE ONLY): 12 min Past Medical History: Past Medical History: Diagnosis Date . Depression  . Diabetes mellitus without complication [facility])  Past Surgical History: Past Surgical History: Procedure Laterality Date . ABDOMINAL HYSTERECTOMY   HPI: Pt is a 65 year old female with history significant for DM and frequent admissions for DKA. Pt was admitted after being found down by her son on 3/28. Upon EMS assessment the patient was cold to the touch and hypotensive. Upon arrival to the ED pt was treated for presumed DKA including 3L crystalloid bolus and insulin infusion. Pt diagnosed with acute encephalopathy. CXR 3/28: No acute abnormality noted. MBS 05/02/20:  mild oropharyngeal dysphagia characterized by reduced bolus cohesion with premature spillage to the pyriform sinuses, and a pharyngeal delay which resulted in transient penetration (PAS 2) with thin liquids. This depth of penetration is considered to be within normal limits; however, pt reported that she "never sits up here" when she is eating and it is anticipated that depth of laryngeal invasion increases with sub-optimal positioning and ultimately results of intermittent aspiration. A regular texture diet with thin liquids was recommended at this time.  No data recorded Assessment / Plan / Recommendation CHL IP CLINICAL IMPRESSIONS 09/17/2020 Clinical Impression Pt presents with oropharyngeal dysphagia characterized by impaired bolus cohesion and a pharyngeal delay. She demonstrated premature spillage to the pyriform sinuses and the swallow was often triggered with >50% of the bolus at this level. Penetration (PAS 3,5) and sensed aspiration (PAS 7) were intermittently noted with thin liquids. Aspiration before deglutition was  secondary to spillover of liquid from the pyriform sinuses into the airway and aspiration after  was of previously penetrated material. A chin tuck posture eliminated laryngeal invsaion in some cases and in others facilitated aspiration before deglutition due to liquids spilling over the base of tongue and over the laryngeal surface of the epiglottis. Coughing was partially effective in clearing the airway. A dysphagia 3 diet with nectar thick liquids will be initiated at this time. SLP will follow for dysphagia treatment. SLP Visit Diagnosis Dysphagia, oropharyngeal phase (R13.12) Attention and concentration deficit following -- Frontal lobe and executive function deficit following -- Impact on safety and function Mild aspiration risk   CHL IP TREATMENT RECOMMENDATION 09/17/2020 Treatment Recommendations Therapy as outlined in treatment plan below   Prognosis 09/17/2020 Prognosis for Safe Diet Advancement Good Barriers to Reach Goals Cognitive deficits Barriers/Prognosis Comment -- CHL IP DIET RECOMMENDATION 09/17/2020 SLP Diet Recommendations Dysphagia 3 (Mech soft) solids;Nectar thick liquid Liquid Administration via Cup;Straw Medication Administration Crushed with puree Compensations Slow rate;Small sips/bites;Minimize environmental distractions Postural Changes Seated upright at 90 degrees   CHL IP OTHER RECOMMENDATIONS 09/17/2020 Recommended Consults -- Oral Care Recommendations Oral care BID Other Recommendations Order thickener from pharmacy   CHL IP FOLLOW UP RECOMMENDATIONS 09/17/2020 Follow up Recommendations Outpatient SLP   CHL IP FREQUENCY AND DURATION 09/17/2020 Speech Therapy Frequency (ACUTE ONLY) min 2x/week Treatment Duration 2 weeks      CHL IP ORAL PHASE 09/17/2020 Oral Phase Impaired Oral - Pudding Teaspoon -- Oral - Pudding Cup -- Oral - Honey Teaspoon -- Oral - Honey Cup -- Oral - Nectar Teaspoon -- Oral - Nectar Cup Premature spillage;Decreased bolus cohesion Oral - Nectar Straw Premature  spillage;Decreased bolus cohesion Oral - Thin Teaspoon -- Oral - Thin Cup Premature spillage;Decreased bolus cohesion Oral - Thin Straw Premature spillage;Decreased bolus cohesion Oral - Puree Premature spillage;Decreased bolus cohesion Oral - Mech Soft -- Oral - Regular Impaired mastication Oral - Multi-Consistency -- Oral - Pill -- Oral Phase - Comment --  CHL IP PHARYNGEAL PHASE 09/17/2020 Pharyngeal Phase Impaired Pharyngeal- Pudding Teaspoon -- Pharyngeal -- Pharyngeal- Pudding Cup -- Pharyngeal -- Pharyngeal- Honey Teaspoon -- Pharyngeal -- Pharyngeal- Honey Cup -- Pharyngeal -- Pharyngeal- Nectar Teaspoon -- Pharyngeal -- Pharyngeal- Nectar Cup Delayed swallow initiation-pyriform sinuses Pharyngeal -- Pharyngeal- Nectar Straw Delayed swallow initiation-pyriform sinuses Pharyngeal -- Pharyngeal- Thin Teaspoon -- Pharyngeal -- Pharyngeal- Thin Cup Delayed swallow initiation-pyriform sinuses;Penetration/Aspiration before swallow;Penetration/Aspiration during swallow;Penetration/Apiration after swallow Pharyngeal Material enters airway, remains ABOVE vocal cords and not ejected out;Material enters airway, CONTACTS cords and not ejected out;Material enters airway, passes BELOW cords and not ejected out despite cough attempt by patient Pharyngeal- Thin Straw Delayed swallow initiation-pyriform sinuses;Penetration/Aspiration before swallow;Penetration/Aspiration during swallow;Penetration/Apiration after swallow Pharyngeal Material enters airway, remains ABOVE vocal cords and not ejected out;Material enters airway, CONTACTS cords and not ejected out;Material enters airway, passes BELOW cords and not ejected out despite cough attempt by patient Pharyngeal- Puree Delayed swallow initiation-pyriform sinuses Pharyngeal -- Pharyngeal- Mechanical Soft -- Pharyngeal -- Pharyngeal- Regular Delayed swallow initiation-vallecula Pharyngeal -- Pharyngeal- Multi-consistency -- Pharyngeal -- Pharyngeal- Pill -- Pharyngeal --  Pharyngeal Comment --  CHL IP CERVICAL ESOPHAGEAL PHASE 09/17/2020 Cervical Esophageal Phase WFL Pudding Teaspoon -- Pudding Cup -- Honey Teaspoon -- Honey Cup -- Nectar Teaspoon -- Nectar Cup -- Nectar Straw -- Thin Teaspoon -- Thin Cup -- Thin Straw -- Puree -- Mechanical Soft -- Regular -- Multi-consistency -- Pill -- Cervical Esophageal Comment -- Meagan I. Vear Clock, MS, CCC-SLP Acute Rehabilitation Services Office number [phone] Pager [phone] Scheryl Marten 09/17/2020, 1:57 PM  Joycelyn Das, MD  [facility] 09/18/2020  If 7PM-7AM, please contact night-coverage

## 2020-09-18 NOTE — TOC Initial Note (Addendum)
Transition of Care [facility]) - Initial/Assessment Note    Patient Details  Name: Meagan Cummings MRN: [id number] Date of Birth: [birth year]  [facility] CM/SW Contact:    Kingsley Plan, RN Phone Number: 09/18/2020, 3:44 PM  Clinical Narrative:                  Spoke to patient at bedside. Confirmed face sheet information. Phone number [phone] is her son Jordan's phone number, that is the best way to contact her. She lives with [name].   PCP is DR Diamantina Providence in [place].   Discussed home health,  Patient is in  agreement with no preference . Kandee Keen with [facility] accepted referral.   Patient requesting ride home. NCM asked if she had keys to get in or if someone was at the home to let her in. Son is at work. Patient does not have keys.   NCM called son. [name] is at work and cannot leave until 10 pm tonight. He will stop by hospital on his way home and pick up his mother and DME.   Tub transfer bench and 3 in1 ordered. [employer] has documented that they delivered tub bench last year. Patient and mother say they never got it. Velna Hatchet will discuss with her supervisor and will contact patient.   Expected Discharge Plan: Home w Home Health Services Barriers to Discharge: No Barriers Identified   Patient Goals and CMS Choice Patient states their goals for this hospitalization and ongoing recovery are:: to return to home CMS Medicare.gov Compare Post Acute Care list provided to:: Patient Choice offered to / list presented to : Patient  Expected Discharge Plan and Services Expected Discharge Plan: Home w Home Health Services   Discharge Planning Services: CM Consult Post Acute Care Choice: Home Health Living arrangements for the past 2 months: Single Family Home Expected Discharge Date: 09/18/20               DME Arranged: 3-N-1,Tub bench DME Agency: [employer] Date DME Agency Contacted: 09/18/20 Time DME Agency Contacted: [phone] Representative spoke with  at DME Agency: Frances[facility] Arranged: RN,PT,OT HH Agency: [facility] Date [facility] Agency Contacted: 09/18/20 Time HH Agency Contacted: [phone] Representative spoke with at [facility] Agency: Kand[facility] Keen  Prior Living Arrangements/Services Living arrangements for the past 2 months: Single Family Home Lives with:: Adult Children Patient language and need for interpreter reviewed:: Yes Do you feel safe going back to the place where you live?: Yes      Need for Family Participation in Patient Care: Yes (Comment) Care giver support system in place?: Yes (comment)   Criminal Activity/Legal Involvement Pertinent to Current Situation/Hospitalization: No - Comment as needed  Activities of Daily Living Home Assistive Devices/Equipment: Cane (specify quad or straight),Walker (specify type),CBG Meter ADL Screening (condition at time of admission) Patient's cognitive ability adequate to safely complete daily activities?: Yes Is the patient deaf or have difficulty hearing?: No Does the patient have difficulty seeing, even when wearing glasses/contacts?: Yes Does the patient have difficulty concentrating, remembering, or making decisions?: No Patient able to express need for assistance with ADLs?: Yes Does the patient have difficulty dressing or bathing?: No Independently performs ADLs?: Yes (appropriate for developmental age) Does the patient have difficulty walking or climbing stairs?: Yes Weakness of Legs: Both Weakness of Arms/Hands: None  Permission Sought/Granted   Permission granted to share information with : Yes, Verbal Permission Granted  Share Information with NAME: [name] [phone]  Permission  granted to share info w AGENCY: [facility]  Permission granted to share info w Relationship: son     Emotional Assessment Appearance:: Appears stated age Attitude/Demeanor/Rapport: Engaged Affect (typically observed): Accepting Orientation: : Oriented to Self,Oriented to Place,Oriented to   Time,Oriented to Situation Alcohol / Substance Use: Not Applicable Psych Involvement: No (comment)  Admission diagnosis:  Shock (HCC) [R57.9] DKA (diabetic ketoacidosis) (HCC) [E11.10] Hyperglycemia [R73.9] Hypothermia, initial encounter [T68.XXXA] Altered mental status, unspecified altered mental status type [R41.82] Diabetic ketoacidosis with coma associated with type 1 diabetes mellitus (HCC) [E10.11] Patient Active Problem List   Diagnosis Date Noted  . DKA (diabetic ketoacidosis) (HCC) 04/29/2020  . Altered mental status   . Hypernatremia   . Ketosis (HCC) 12/25/2019  . Acute metabolic encephalopathy 12/25/2019  . Hypoglycemia 12/24/2019  . Acute metabolic encephalopathy 12/24/2019  . Uncontrolled diabetes mellitus (HCC) 12/24/2019  . Acute renal failure syndrome (HCC)   . Diabetic ketoacidosis [facility])    PCP:  Medicine, [facility] Pharm[facility]:   [facility] [phone] - [place], [place] - [address] AT [facility] [address] [facility] [address] [place] [address] Phone: [phone] Fax: [phone]     Social Determinants of Health (SDOH) Interventions    Readmission Risk Interventions No flowsheet data found.

## 2020-09-19 NOTE — Progress Notes (Signed)
Patient discharged to home accompanied by son via wheelchair. Discharged instructions explained to patient and patient verbalized understanding.  DME sent with patient.

## 2020-09-20 LAB — CULTURE, BLOOD (ROUTINE X 2)
Culture: NO GROWTH
Culture: NO GROWTH

## 2020-10-17 ENCOUNTER — Encounter (HOSPITAL_COMMUNITY): Payer: Self-pay

## 2020-10-17 ENCOUNTER — Other Ambulatory Visit: Payer: Self-pay

## 2020-10-17 ENCOUNTER — Emergency Department (HOSPITAL_COMMUNITY): Payer: Medicare Other

## 2020-10-17 ENCOUNTER — Emergency Department (HOSPITAL_COMMUNITY)
Admission: EM | Admit: 2020-10-17 | Discharge: 2020-10-17 | Disposition: A | Discharge status: Home / Self Care | Payer: Medicare Other | Attending: Emergency Medicine | Admitting: Emergency Medicine

## 2020-10-17 DIAGNOSIS — E119 Type 2 diabetes mellitus without complications: Secondary | ICD-10-CM | POA: Diagnosis not present

## 2020-10-17 DIAGNOSIS — E162 Hypoglycemia, unspecified: Secondary | ICD-10-CM

## 2020-10-17 DIAGNOSIS — F17210 Nicotine dependence, cigarettes, uncomplicated: Secondary | ICD-10-CM | POA: Diagnosis not present

## 2020-10-17 LAB — BASIC METABOLIC PANEL
Anion gap: 9 (ref 5–15)
BUN: 17 mg/dL (ref 8–23)
CO2: 27 mmol/L (ref 22–32)
Calcium: 9.7 mg/dL (ref 8.9–10.3)
Chloride: 106 mmol/L (ref 98–111)
Creatinine, Ser: 1.03 mg/dL — ABNORMAL HIGH (ref 0.44–1.00)
GFR, Estimated: >60 mL/min (ref >60)
Glucose, Bld: 130 mg/dL — ABNORMAL HIGH (ref 70–99)
Potassium: 3.7 mmol/L (ref 3.5–5.1)
Sodium: 142 mmol/L (ref 135–145)

## 2020-10-17 LAB — CBG MONITORING, ED
Glucose-Capillary: 106 mg/dL — ABNORMAL HIGH (ref 70–99)
Glucose-Capillary: 141 mg/dL — ABNORMAL HIGH (ref 70–99)

## 2020-10-17 LAB — URINALYSIS, ROUTINE W REFLEX MICROSCOPIC
Bacteria, UA: NONE SEEN
Bilirubin Urine: NEGATIVE
Glucose, UA: >=500 mg/dL — AB
Hgb urine dipstick: NEGATIVE
Ketones, ur: NEGATIVE mg/dL
Leukocytes,Ua: NEGATIVE
Nitrite: NEGATIVE
Protein, ur: NEGATIVE mg/dL
Specific Gravity, Urine: 1.021 (ref 1.005–1.030)
pH: 5 (ref 5.0–8.0)

## 2020-10-17 LAB — CBC
HCT: 40 % (ref 36.0–46.0)
Hemoglobin: 12.3 g/dL (ref 12.0–15.0)
MCH: 27 pg (ref 26.0–34.0)
MCHC: 30.8 g/dL (ref 30.0–36.0)
MCV: 87.9 fL (ref 80.0–100.0)
Platelets: 244 10*3/uL (ref 150–400)
RBC: 4.55 MIL/uL (ref 3.87–5.11)
RDW: 15.6 % — ABNORMAL HIGH (ref 11.5–15.5)
WBC: 9 10*3/uL (ref 4.0–10.5)
nRBC: 0 % (ref 0.0–0.2)

## 2020-10-17 NOTE — ED Notes (Signed)
Pt aware of needing urine sample states @ this time does not have to void, but will notify RN. Bed low and locked. Call bell in reach. Side rails up x 2.

## 2020-10-17 NOTE — ED Triage Notes (Addendum)
BIBA from home. Pt found to have FSBS of 41. Given oral dextrose and 1mg  IM glucagon. Combative on scene. FSBS 110 after medications. Pt calm cooperative @ this time. A.0 x4.

## 2020-10-17 NOTE — ED Provider Notes (Signed)
[facility]ERGENCY DEPT [facility] Emergency Department Provider Note MRN:  [id number]  Arrival date & time: 10/17/20     Chief Complaint   Hypoglycemia   History of Present Illness   Meagan Cummings is a 65 y.o. year-old female with a history of diabetes presenting to the ED with chief complaint of hypoglycemia.  Patient was exhibiting erratic behavior, shaking her arms, screaming and yelling.  She acts this way when her blood sugar drops low.  EMS was called.  Found to have a blood sugar of 41.  Provided with glucagon in route.  Patient currently without complaints, states she has not had any insulin for 2 days and so she is unsure why her sugar dropped low.  Denies any fever, no cough, no burning with urination.  No pain at this time.  Review of Systems  A complete 10 system review of systems was obtained and all systems are negative except as noted in the HPI and PMH.   Patient's Health History    Past Medical History:  Diagnosis Date  . Depression   . Diabetes mellitus without complication [facility])     Past Surgical History:  Procedure Laterality Date  . ABDOMINAL HYSTERECTOMY      History reviewed. No pertinent family history.  Social History   Socioeconomic History  . Marital status: Single    Spouse name: Not on file  . Number of children: Not on file  . Years of education: Not on file  . Highest education level: Not on file  Occupational History  . Not on file  Tobacco Use  . Smoking status: Current Every Day Smoker    Packs/day: 0.50    Types: Cigarettes  . Smokeless tobacco: Never Used  Vaping Use  . Vaping Use: Never used  Substance and Sexual Activity  . Alcohol use: Yes    Alcohol/week: 1.0 standard drink    Types: 1 Cans of beer per week  . Drug use: Not Currently    Types: Cocaine, Marijuana  . Sexual activity: Never    Birth control/protection: Post-menopausal  Other Topics Concern  . Not on file  Social History Narrative  . Not on file    Social Determinants of Health   Financial Resource Strain: Not on file  Food Insecurity: Not on file  Transportation Needs: Not on file  Physical Activity: Not on file  Stress: Not on file  Social Connections: Not on file  Intimate Partner Violence: Not on file     Physical Exam   Vitals:   10/17/20 0645 10/17/20 0700  BP: 133/86 (!) 141/85  Pulse: 71 73  Resp: 12 14  Temp:    SpO2: 100% 100%    CONSTITUTIONAL: Well-appearing, NAD NEURO:  Alert and oriented x 3, no focal deficits EYES:  eyes equal and reactive ENT/NECK:  no LAD, no JVD CARDIO: Regular rate, well-perfused, normal S1 and S2 PULM:  CTAB no wheezing or rhonchi GI/GU:  normal bowel sounds, non-distended, non-tender MSK/SPINE:  No gross deformities, no edema SKIN:  no rash, atraumatic PSYCH:  Appropriate speech and behavior  *Additional and/or pertinent findings included in MDM below  Diagnostic and Interventional Summary    EKG Interpretation  Date/Time:  Friday October 17 2020 05:32:05 EDT Ventricular Rate:  82 PR Interval:  153 QRS Duration: 73 QT Interval:  379 QTC Calculation: 443 R Axis:   54 Text Interpretation: Sinus rhythm Probable left atrial enlargement No significant change was found Confirmed by Kennis Carina [phone]) on 10/17/2020 6:08:44  AM      Labs Reviewed  CBC - Abnormal; Notable for the following components:      Result Value   RDW 15.6 (*)    All other components within normal limits  BASIC METABOLIC PANEL - Abnormal; Notable for the following components:   Glucose, Bld 130 (*)    Creatinine, Ser 1.03 (*)    All other components within normal limits  URINALYSIS, ROUTINE W REFLEX MICROSCOPIC - Abnormal; Notable for the following components:   Glucose, UA >=500 (*)    All other components within normal limits  CBG MONITORING, ED - Abnormal; Notable for the following components:   Glucose-Capillary 106 (*)    All other components within normal limits  CBG MONITORING, ED -  Abnormal; Notable for the following components:   Glucose-Capillary 141 (*)    All other components within normal limits    DG Chest [facility] 1 View  Final Result      Medications - No data to display   Procedures  /  Critical Care Procedures  ED Course and Medical Decision Making  I have reviewed the triage vital signs, the nursing notes, and pertinent available records from the EMR.  Listed above are laboratory and imaging tests that I personally ordered, reviewed, and interpreted and then considered in my medical decision making (see below for details).  Transient episode of abnormal behavior similar to prior episodes of hypoglycemia.  Blood sugar is now normal after glucagon, will monitor here in the emergency department.  Given that she denies any use of insulin for the past few days, will also obtain infectious work-up.  If reassuring work-up and blood sugar continues to be normal, would be appropriate for discharge.     Work-up reassuring, repeat glucose is normal, appropriate for discharge.  Elmer Sow. Pilar Plate, MD [facility] Emergency Medicine [facility] [email or web address]  Final Clinical Impressions(s) / ED Diagnoses     ICD-10-CM   1. Hypoglycemia  E16.2     ED Discharge Orders    None       Discharge Instructions Discussed with and Provided to Patient:     Discharge Instructions     You were evaluated in the Emergency Department and after careful evaluation, we did not find any emergent condition requiring admission or further testing in the hospital.  Your exam/testing today is overall reassuring.  No signs of infection or other emergencies.  Please follow-up closely with your regular doctor to discuss your insulin dosing.  Please return to the Emergency Department if you experience any worsening of your condition.   Thank you for allowing [place] to be a part of your care.       Sabas Sous, MD 10/17/20 [phone]

## 2020-10-17 NOTE — ED Notes (Signed)
ED Provider at bedside. 

## 2020-10-17 NOTE — Discharge Instructions (Addendum)
You were evaluated in the Emergency Department and after careful evaluation, we did not find any emergent condition requiring admission or further testing in the hospital.  Your exam/testing today is overall reassuring.  No signs of infection or other emergencies.  Please follow-up closely with your regular doctor to discuss your insulin dosing.  Please return to the Emergency Department if you experience any worsening of your condition.   Thank you for allowing [place] to be a part of your care.

## 2020-10-17 NOTE — ED Notes (Signed)
MD @ bedside attempting Ultrasound IV

## 2020-10-17 NOTE — ED Notes (Signed)
Discharge instructions reviewed and explained. Pt verbalized understanding of importance of following up with PCP asap, monitoring CBG regularly at home, and taking medications as prescribed.

## 2020-10-17 NOTE — ED Notes (Signed)
This RN and another RN attempted x2 for PIV access. Unsuccessful

## 2021-07-19 IMAGING — CT CT ABD-PELV W/ CM
2 of 5 series · 16 of 46 positions shown, 18 images · IV contrast (omnipaque)
Comparison: None.

CLINICAL DATA: Epigastric pain. Patient was found on the floor by
her son. Blood sugar was 31.

EXAM:
CT ABDOMEN AND PELVIS WITH CONTRAST
TECHNIQUE: Multidetector CT imaging of the abdomen and pelvis was performed
using the standard protocol following bolus administration of
intravenous contrast.
CONTRAST:  100mL OMNIPAQUE IOHEXOL 300 MG/ML  SOLN

[Series 6: abd/ pelvis 5.0 i30f 2 · axial · 0.88mm/px · z∈[-799,-394]mm · 13 of 91 slices shown, 15 images]
[im 5/91  soft-tissue]
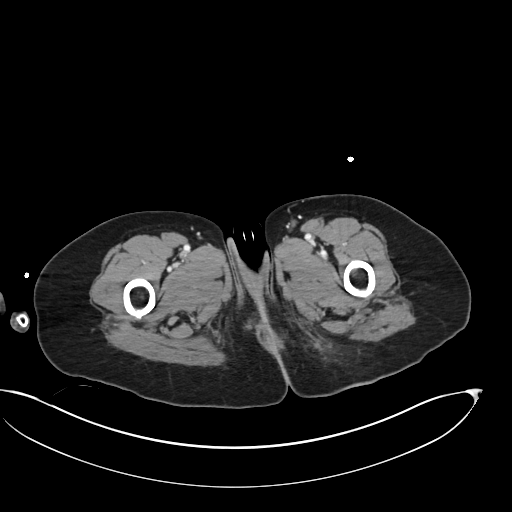
[im 5/91  bone]
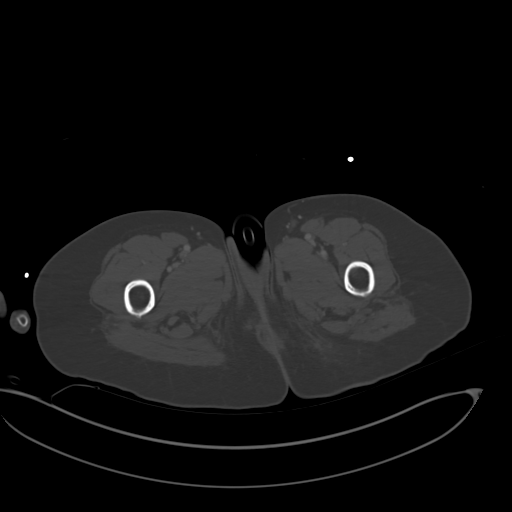
[im 15/91  soft-tissue]
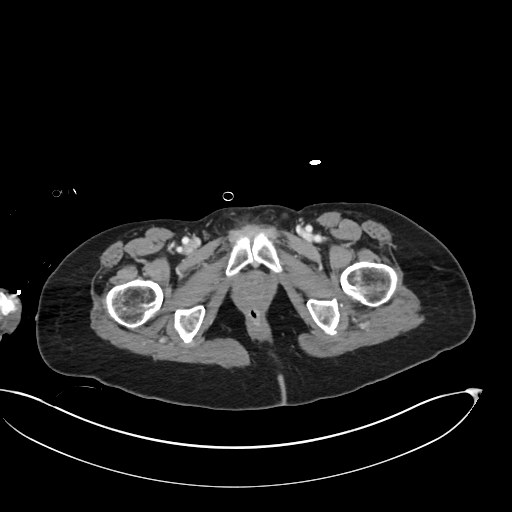
[im 19/91  soft-tissue]
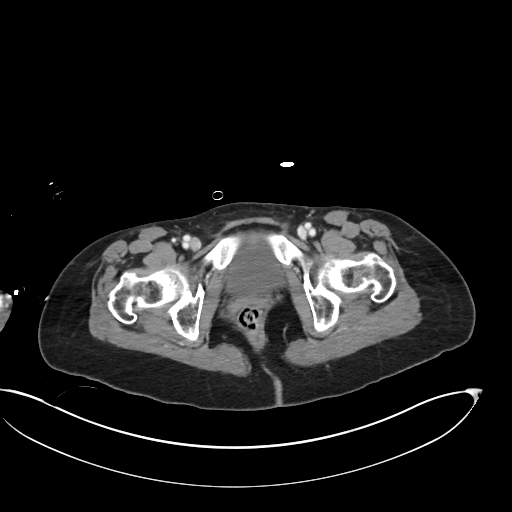
[im 24/91  soft-tissue]
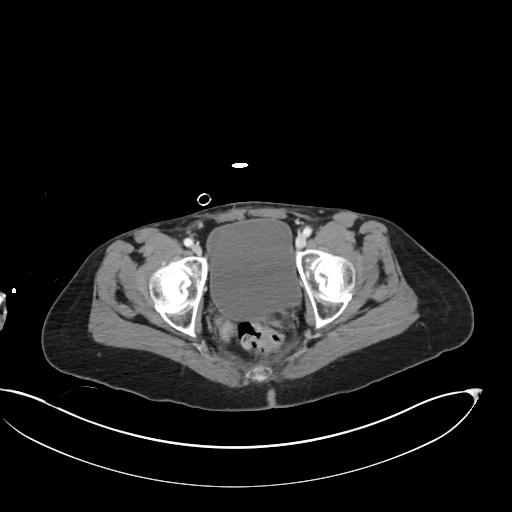
[im 34/91  soft-tissue]
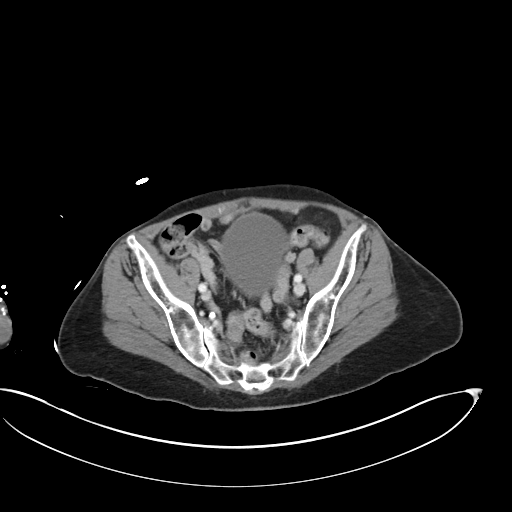
[im 38/91  soft-tissue]
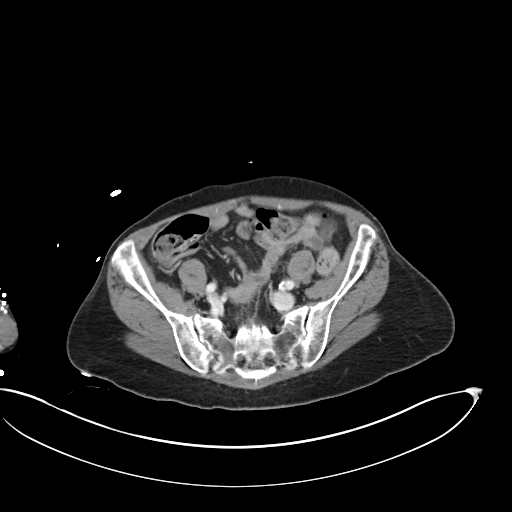
[im 48/91  soft-tissue]
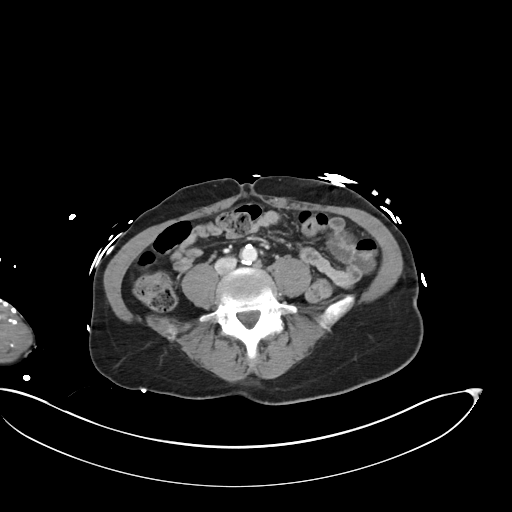
[im 53/91  soft-tissue]
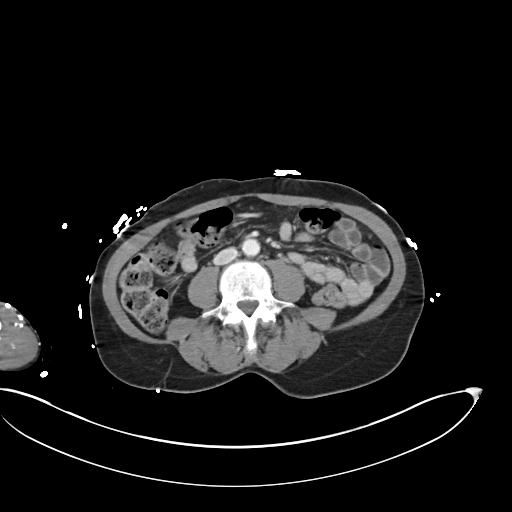
[im 57/91  soft-tissue]
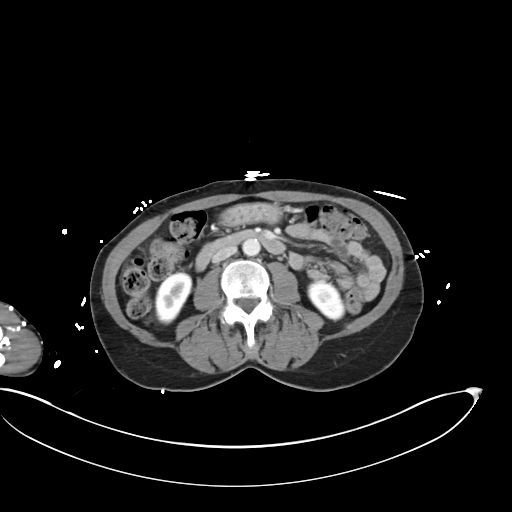
[im 57/91  bone]
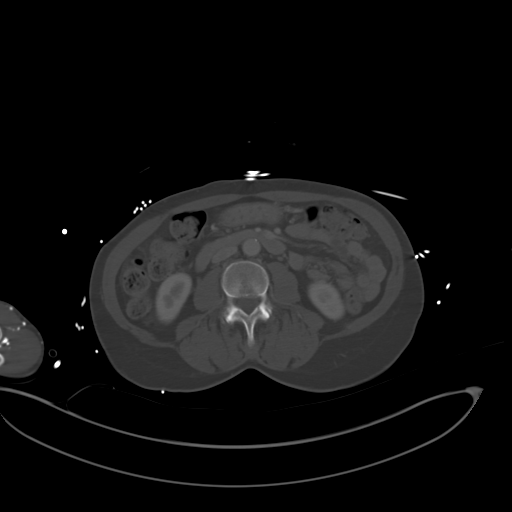
[im 67/91  soft-tissue]
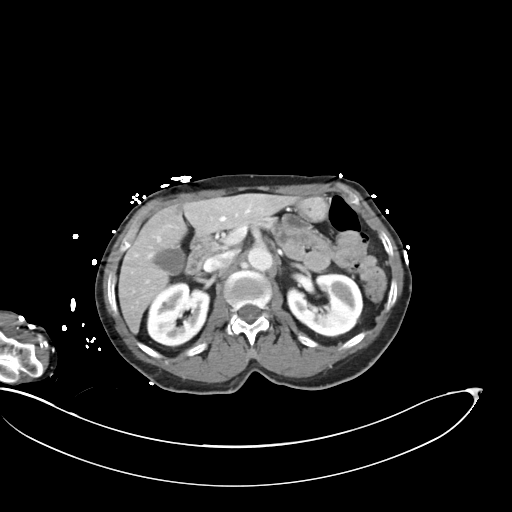
[im 72/91  soft-tissue]
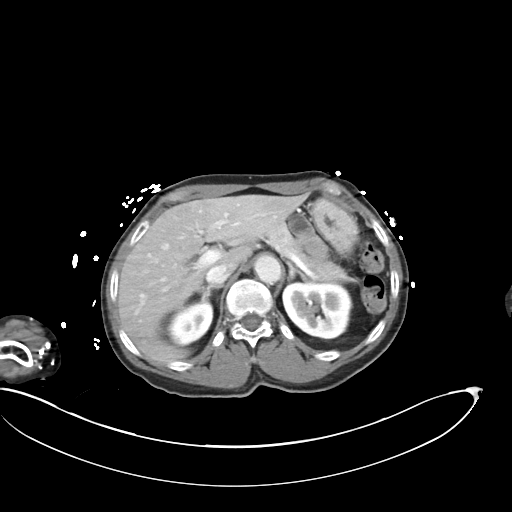
[im 76/91  soft-tissue]
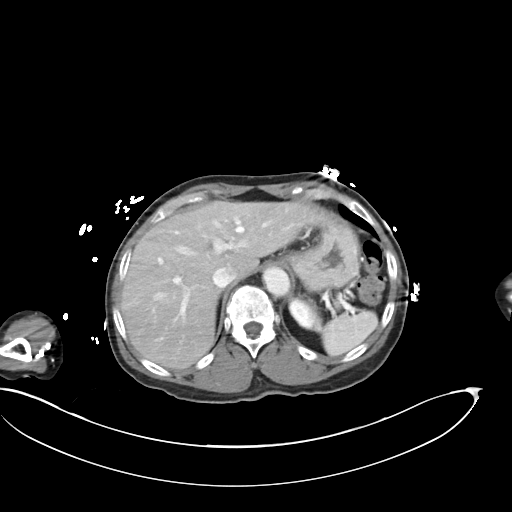
[im 86/91  soft-tissue]
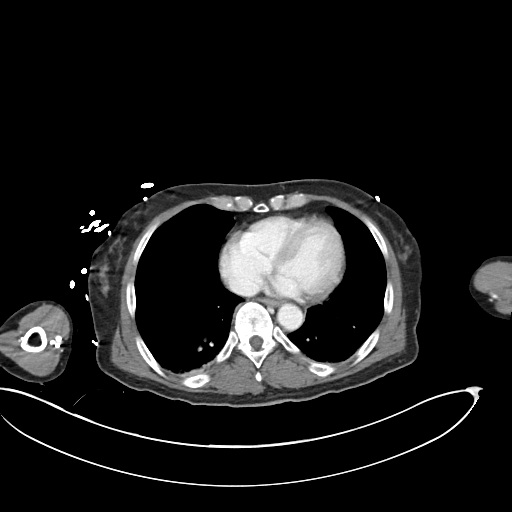

[Series 9: coronal soft tissue · coronal · 0.81mm/px · 3 of 74 slices shown]
[im 25/74  soft-tissue]
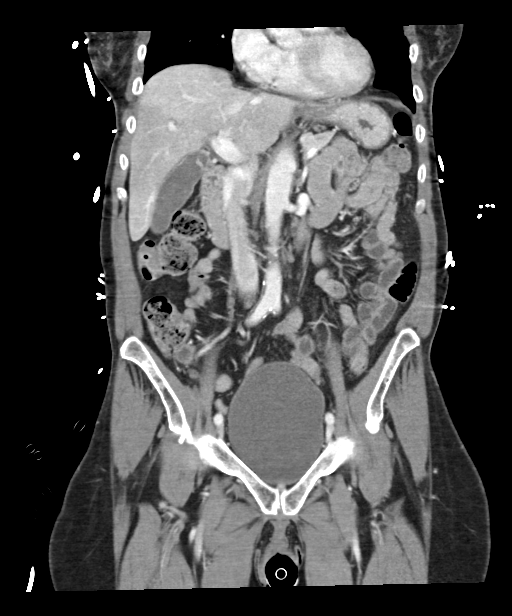
[im 33/74  soft-tissue]
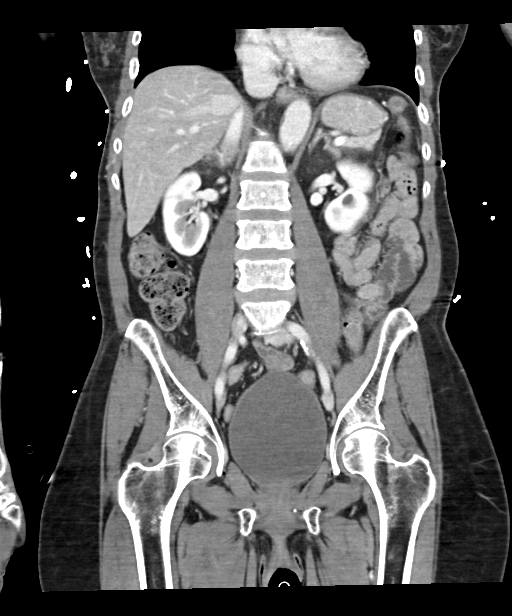
[im 41/74  soft-tissue]
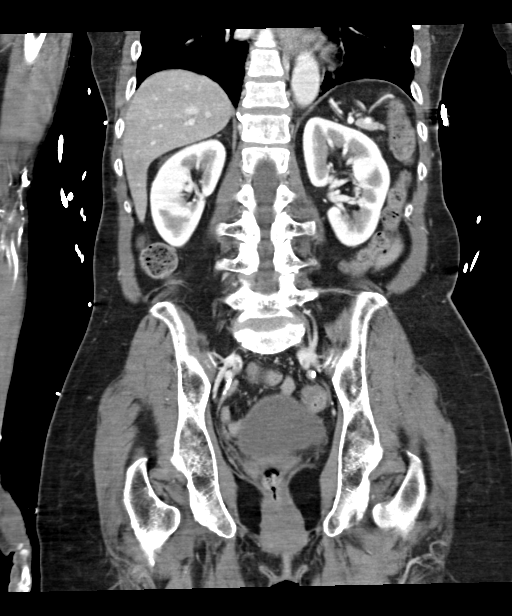

[16 of 46 positions shown; findings below may reference images not displayed]

FINDINGS: Lower chest: There is bibasilar atelectasis.  Heart size is normal.

Hepatobiliary: Focal fatty infiltration adjacent to the falciform
ligament. Gallbladder is unremarkable.

Pancreas: Unremarkable. No pancreatic ductal dilatation or
surrounding inflammatory changes.

Spleen: Normal in size without focal abnormality.

Adrenals/Urinary Tract: Normal adrenal glands. Subcentimeter small
renal lesions bilaterally likely represent cysts but are too small
fully characterize. No hydronephrosis. Ureters are unremarkable.
Urinary bladder is distended and otherwise normal in appearance.

Stomach/Bowel: The stomach and small bowel loops are normal in
appearance. The appendix is not seen and may be surgically absent.
Loops of colon are unremarkable.

Vascular/Lymphatic: There is atherosclerotic calcification of the
abdominal aorta. No associated aneurysm. There is normal vascular
opacification of the celiac axis, superior mesenteric artery, and
inferior mesenteric artery. Normal appearance of the portal venous
system and inferior vena cava. No retroperitoneal or mesenteric
adenopathy.

Reproductive: Prior hysterectomy.  No adnexal mass.

Other: No free pelvic fluid. Small fat containing paraumbilical
hernia.

Musculoskeletal: No fracture is seen.
IMPRESSION: 1. No evidence for acute abnormality of the abdomen or pelvis.
2. Small fat containing paraumbilical hernia.
3. Prior hysterectomy.
4. Aortic Atherosclerosis ([id number].T).

## 2022-04-11 IMAGING — DX DG CHEST 1V PORT
1 series · 1 of 1 positions shown · non-contrast
Comparison: 04/28/2020

CLINICAL DATA: Altered mental status and hyperglycemia

EXAM:
PORTABLE CHEST 1 VIEW

[chest ap]
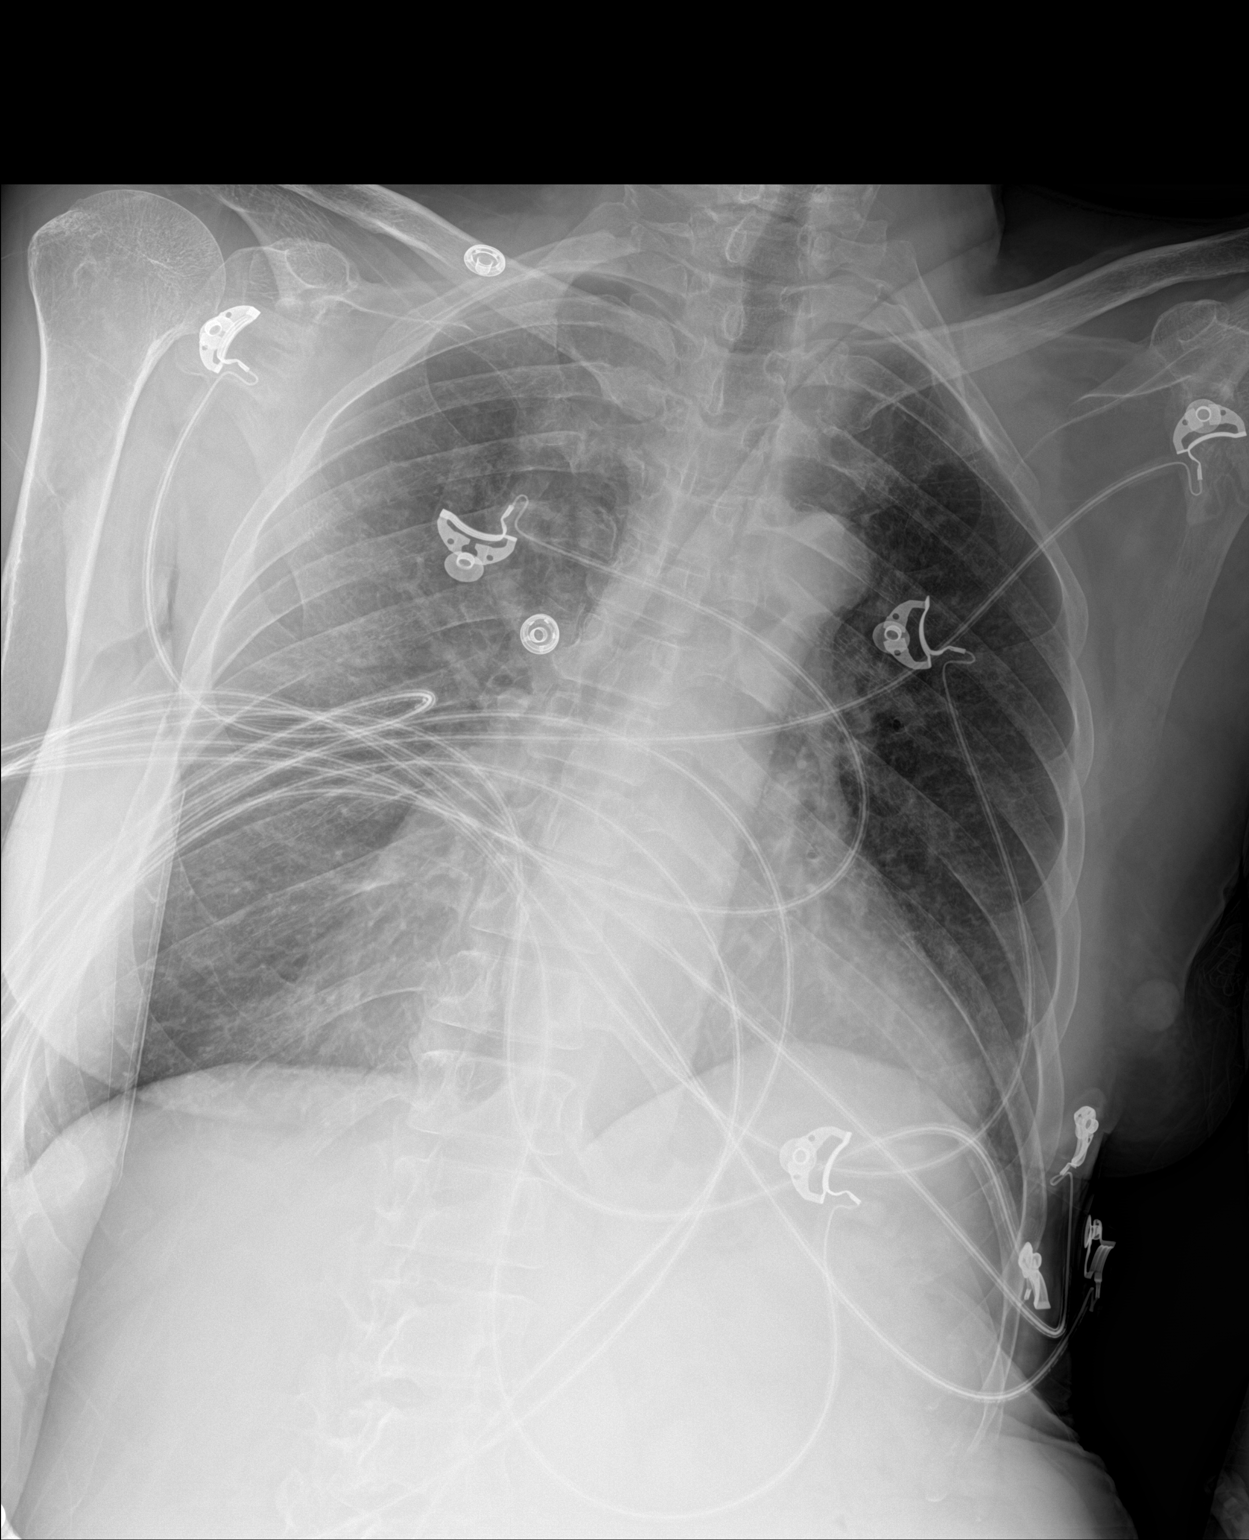

[1 of 1 positions shown; findings below may reference images not displayed]

FINDINGS: Cardiac shadow is mildly prominent but accentuated by the portable
technique. Large skin fold is noted over the midline. Lungs are well
aerated bilaterally. Increased density over the right lung is felt
to be related to underlying soft tissue prominence. No bony
abnormality is seen.
IMPRESSION: No acute abnormality noted.

## 2022-04-12 IMAGING — DX DG ABDOMEN 1V
1 series · 1 of 1 positions shown · non-contrast
Comparison: CT 12/24/2019

CLINICAL DATA: Clearance for MRI

EXAM:
ABDOMEN - 1 VIEW

[abdomen]
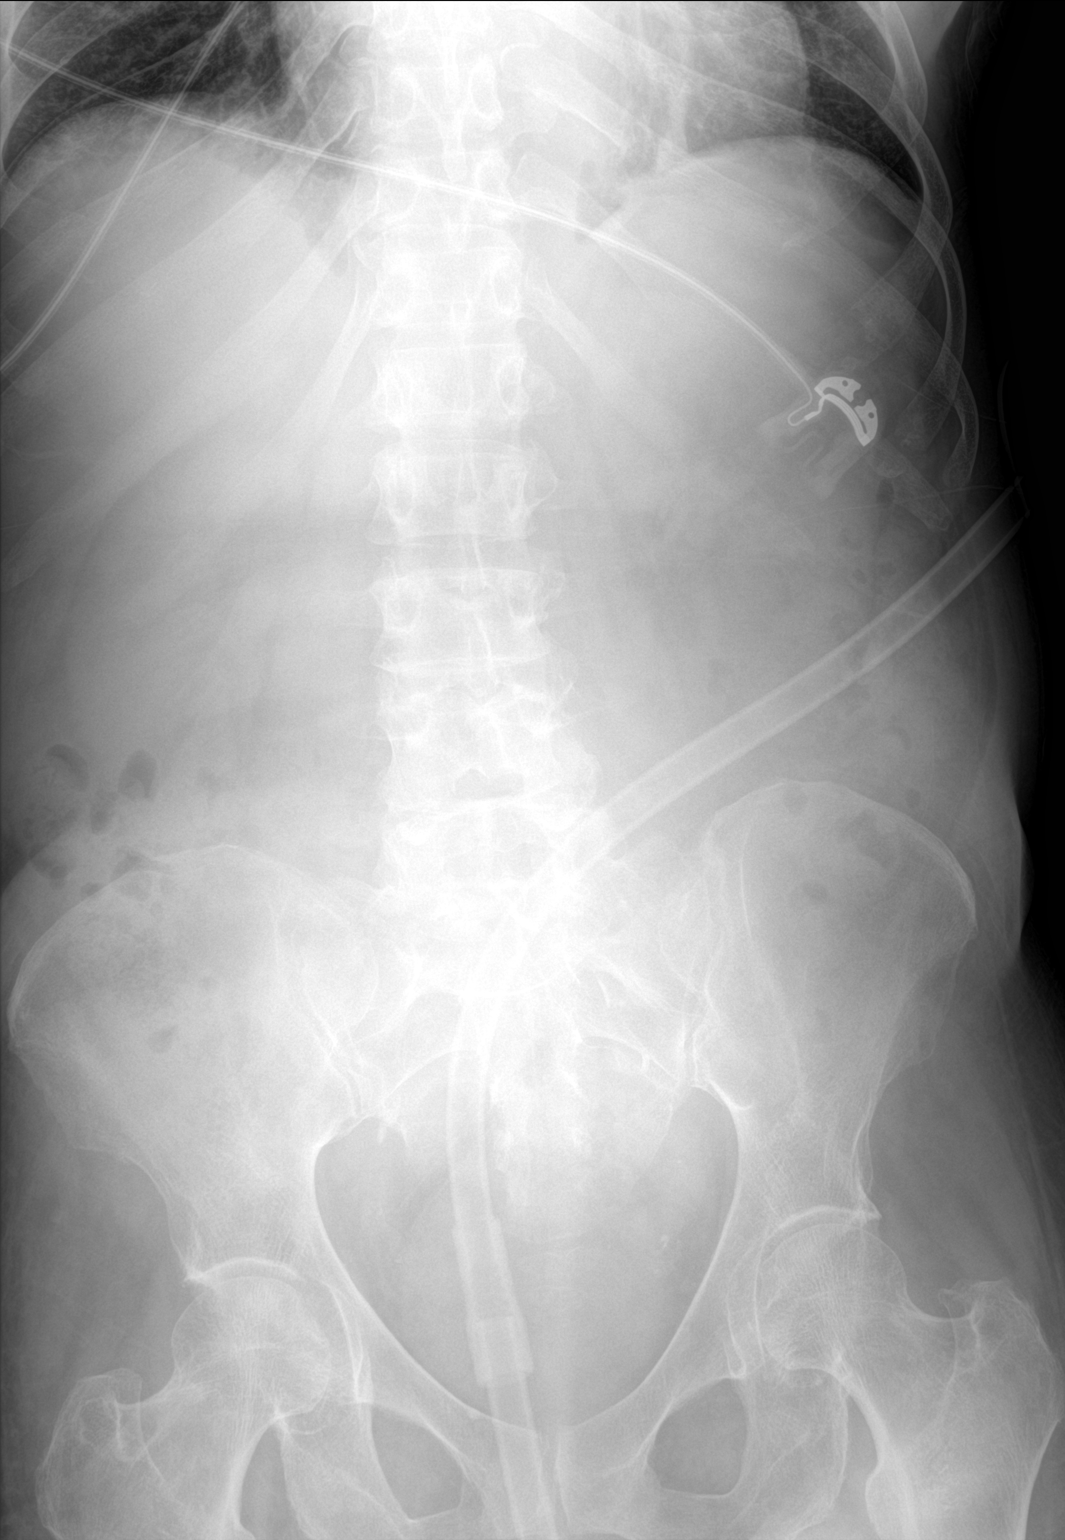

[1 of 1 positions shown; findings below may reference images not displayed]

FINDINGS: Nonobstructed gas pattern. No radiopaque calculi. No metallic
density foreign bodies over the visualized portions of the abdomen
and pelvis.
IMPRESSION: Nonobstructed gas pattern. No metallic density foreign bodies are
visualized

## 2023-12-12 NOTE — Discharge Summary (Signed)
 [facility]  Discharge Summary  PCP: Corean JONETTA Poplin, FNP Discharge Details   Admit date:         12/05/2023 Discharge date and time:       12/21/2023 Hospital LOS:    17  days  Active Hospital Problems   Diagnosis Date Noted POA  . Obesity (BMI 30-39.9) 12/05/2023 Yes  . MDD (major depressive disorder), recurrent episode, moderate (*) 03/26/2020 Yes  . PTSD (post-traumatic stress disorder) 04/27/2018 Yes  . GAD (generalized anxiety disorder) 12/23/2016 Yes  . Noncompliance with diabetes treatment 03/14/2015 Not Applicable  . Diabetic peripheral neuropathy (*) 03/14/2015 Yes    Resolved Hospital Problems   Diagnosis Date Noted Date Resolved POA  . *Hyperosmolar hyperglycemic state (HHS) (*) 12/05/2023 12/21/2023 Yes  . Delirium 05/12/2020 12/21/2023 Yes  . AKI (acute kidney injury) 06/27/2014 12/21/2023 Yes      Current Discharge Medication List     START taking these medications      Details  gabapentin  100 mg capsule Commonly known as: NEURONTIN  Replaces: gabapentin  800 mg tablet  Take two capsules (200 mg dose) by mouth 3 (three) times a day. Quantity: 180 capsule   polyethylene glycol 17 g packet Commonly known as: MIRALAX  Replaces: polyethylene glycol 17 GM/SCOOP powder  Dissolve 17 grams (1 packet) in liquid and drink by mouth daily as needed for up to 14 days. Quantity: 14 each       CONTINUE these medications which have CHANGED      Details  BD PEN NEEDLE SHORT U/F 31G X 8 MM Misc Generic drug: Insulin  Pen Needle What changed: Another medication with the same name was removed. Continue taking this medication, and follow the directions you see here.  Use as directed for insulin  injections. Quantity: 100 each   * insulin  lispro (1 Unit Dial) 100 Units/mL Sopn injection Commonly known as: HUMALOG KWIKPEN What changed: additional instructions  INJECT 3 UNITS SUBCUTANEOUSLY 10-15 MINUTES  BEFORE BREAKFAST AND DINNER Indication: Type 2 Diabetes Quantity: 15 mL   * insulin  lispro (1 Unit Dial) 100 Units/mL Sopn injection Commonly known as: HUMALOG KWIKPEN What changed: You were already taking a medication with the same name, and this prescription was added. Make sure you understand how and when to take each.  Inject 2 to 10 Units into the skin 15 (fifteen) minutes before meals. Quantity: 15 mL   linaclotide 290 mcg capsule Commonly known as: LINZESS What changed:  additional instructions Another medication with the same name was removed. Continue taking this medication, and follow the directions you see here.  Take one capsule (290 mcg dose) by mouth daily. Indication: Chronic Constipation of Unknown Cause Quantity: 30 capsule   QUEtiapine  fumarate 100 mg tablet Commonly known as: SEROQUEL  What changed:  medication strength how much to take how to take this when to take this additional instructions  Take one tablet (100 mg dose) by mouth at bedtime. Quantity: 30 tablet   SPIRIVA  HANDIHALER 18 MCG inhalation capsule Generic drug: tiotropium bromide  monohydrate What changed: See the new instructions.  Place one capsule (18 mcg dose) into inhaler and inhale daily. Quantity: 30 capsule      * * This list has 2 medication(s) that are the same as other medications prescribed for you. Read the directions carefully, and ask your doctor or other care provider to review them with you.          CONTINUE these medications which have NOT CHANGED  Details  ACCU-CHEK GUIDE test strip Generic drug: glucose blood  Use to monitor blood glucose 3 time(s) daily Quantity: 100 each   ACCU-CHEK GUIDE w/Device Kit  1 each by Does not apply route 3 (three) times a day as needed. Use as directed by physician to monitor blood sugars Quantity: 1 kit   ACCU-CHEK SOFTCLIX LANCETS lancets  Use to monitor blood glucose 3 time(s) daily. Quantity: 100 each   albuterol   sulfate HFA 108 (90 Base) MCG/ACT inhaler Commonly known as: VENTOLIN  HFA  Inhale two puffs into the lungs every 6 (six) hours as needed for Wheezing or Shortness of Breath. Quantity: 18 g   ARIPiprazole  2 mg tablet Commonly known as: ABILIFY   TAKE 1 TABLET BY MOUTH AFTER BREAKFAST Quantity: 30 tablet   atorvastatin  40 mg tablet Commonly known as: LIPITOR  Take one tablet (40 mg dose) by mouth daily. Quantity: 30 tablet   B1 100 MG Tabs  Take 1 tablet (100 mg) by mouth every morning. Quantity: 30 tablet   FARXIGA 5 MG tablet Generic drug: dapagliflozin  Take one tablet (5 mg dose) by mouth daily. Indication: Type 2 Diabetes Quantity: 30 tablet   FREESTYLE LIBRE 2 SENSOR SYSTM Misc  Place 1 each onto the skin every 14 (fourteen) days. Quantity: 8 each   LANTUS  SOLOSTAR 100 UNIT/ML Sopn  Inject twenty Units into the skin 2 (two) times daily. Quantity: 15 mL   pantoprazole  sodium 40 mg tablet Commonly known as: PROTONIX   Take one tablet (40 mg dose) by mouth 2 (two) times daily. Quantity: 60 tablet   [facility] INCONTINENCE PADS Misc  1 each by Does not apply route 3 (three) times a day. Quantity: 150 each   SYMBICORT 160-4.5 MCG/ACT inhaler Generic drug: budesonide-formoterol   Inhale two puffs into the lungs 2 (two) times daily. Quantity: 11 g   venlafaxine HCl 75 mg 24 hr capsule Commonly known as: EFFEXOR-XR  Take 1 capsule by mouth in the morning Quantity: 30 capsule      * You might also be taking other medications not listed above. If you have questions about any of your other medications, talk to the person who prescribed them or your Primary Care Provider.          STOP taking these medications    gabapentin  800 mg tablet Commonly known as: NEURONTIN  Replaced by: gabapentin  100 mg capsule   polyethylene glycol 17 GM/SCOOP powder Commonly known as: MIRALAX ,GAVILAX,CLEARLAX Replaced by: polyethylene glycol 17 g packet   Semaglutide(0.25 or  0.5MG /DOSE) 2 MG/1.5ML Sopn pen Commonly known as: [facility]          Hospital Course   CC: Confusion, elevated blood sugar   History of Present Illness:  Meagan Cummings is a 68 y.o. female with history of type 2 diabetes on insulin  which has been poorly controlled with admissions for hyperglycemia in the past, PTSD, anxiety, depression, COPD, alcohol use, was brought to the emergency room after EMS was contacted due to hyperglycemia at home and some alteration in her mental state.  The patient is a vague historian and no family is currently available.  The patient states that she lives at home and has a caregiver who assists in giving her insulin  but there was some issue with it recently either the insulin  was lost or misplaced due to someone going on vacation she did not receive her insulin  the last few days.  She is not able to give any other significant relevant history, denies having any  current chest pain, abdominal pain, fever, chills, nausea, vomiting, diarrhea.  Hospital Course:       68 yo female with history of DM, anxiety, PTSD, depression, COPD, ETOH use who presented with HHS due to non-compliance. She had a complex social situation, lived in a group home with varying reports on the stability and cleanliness of the situation. Her son is her HCPOA and guardian and there have been issues getting in contact with him throughout her hospital stay. Pt reported that they went on a vacation to the beach and could not find her insulin . She continued to feel worse and presented to the ED where she was found to have HHS. She was treated with IVF and insulin  with resolution of AKI and hypernatremia and now her mentation is also back to baseline. We have followed closely and adjusted her insulin  regimen.  She remains a brittle diabetic but the regimen below has worked well for her.  We have worked with her son to choose an ALF and she is now stable to discharge today.  Further details by  problem below.     HHS Due to noncompliance.  Glucose was >500 on admission with slightly elevated ketones, elevated anion GAP.  Initial pH was 7.2.  HbA1c was 11.4.  Home regimen include Lantus  20 units twice daily, lispro 7 units 3 times daily, Farxiga and semaglutide.  She was initially kept on IV fluids and subcutaneous insulin , she did not receive IV insulin  as her blood sugars improved rapidly.   - will discharge on Lantus  20u BID, lispro 3u with breakfast and dinner plus SSI - will also continue [place] - discontinue semaglutide as she was having issues with constipation   Delirium Likely has cognitive deficits at baseline Present on admission. Likely due to mild DKA and toxicity from gabapentin  in the setting of AKI. Reduced dose of gabapentin .  CT head was negative for acute pathology.  Her mentation has improved to baseline condition.  Likely she has some cognitive deficits at baseline.  She will be discharged to ALF   AKI Baseline Cr <1. Cr on admission 1.6. Due to HHS and dehydration. Now resolved with IVF   Hypernatremia-improved Na increased to 150. IVF switched to 1/2 NS - resolved   Anxiety, PTSD, depression Continued home abilify , seroquel  and effexor   COPD not in exacerbation, continued home inhalers   Hx ETOH use ETOH level <10 on admission    Recommendations to physicians/followup needed:  - the most important thing for the pt going forward at ALF is close monitoring and help with glucose monitoring and insulin  administration.  Lantus , plus lispro plus sliding scale.  - She is a brittle diabetic and will have some highs and lows.  She typically does not have symptoms with lows but will need juice, food if she does have lows.       Physical Exam: Vitals:   12/21/23 0800  BP: 104/66  Pulse: 81  Resp: 20  Temp: 98.5 F (36.9 C)  SpO2: 96%   Constitutional - elderly female lying in bed, comfortable Eyes - pupils equal round and reactive to light and  accomodation Nose - no gross deformity or drainage Mouth - no oral lesions noted       CV - (+)S1S2, no murmurs, or peripheral edema,   Resp - CTA bilaterally, no wheezing or crackles; no clubbing, cyanosis  GI - (+)BS, soft, non-tender, non-distended MSK- ROM normal  Skin - no rashes or wounds Neuro - alert,  aware, oriented to person, place, month, poor memory Psych - normal affect and mood     Labs on Discharge:  Recent Labs    Units 12/15/23 0635  WBC thou/mcL 5.1  HGB gm/dL 87.8  HCT % 59.9  PLT thou/mcL 167   Recent Labs    Units 12/15/23 0635  NA mmol/L 139  K mmol/L 4.2  CL mmol/L 108  CO2 mmol/L 21  BUN mg/dL 9  CREATININE mg/dL 9.39  CALCIUM  mg/dL 8.7  No results for input(s): BILITOT, AST, ALT, ALKPHOS, PROT, ALBUMIN, LDH, URICACID in the last 168 hours. No results for input(s): TSH, HGBA1C in the last 168 hours.  No results for input(s): LABPROT, INR, PTT in the last 168 hours. No results for input(s): CHOL, LDL, HDL, TRIG in the last 168 hours. No results for input(s): TROPONIN, CK in the last 168 hours.  Invalid input(s): CK-MB  Diagnostics:   CT Head WO Contrast  Final Result  IMPRESSION:   CT HEAD  No acute intracranial abnormality identified.    Electronically Signed by: Charlie Patch, MD on 12/05/2023 7:19 PM    CT Abdomen Pelvis WO IV Contrast ([facility] Protocol)  Final Result  Impression:  No acute abnormality involving the abdomen or pelvis. No nephroureterolithiasis.    Electronically Signed by: Allison Kobus on 12/05/2023 7:22 PM    XR Chest Ap Portable  Final Result  IMPRESSION:  No acute pulmonary abnormality.      Electronically Signed by: Allison Kobus on 12/05/2023 6:37 PM    XR Shoulder 2+ Views Right  Final Result  IMPRESSION:  No acute osseous abnormality.     Electronically Signed by: Allison Kobus on 12/05/2023 6:36 PM       Post Hospital Care  No discharge procedures on  file.  Diet: Consistent Carbohydrate No metal on tray - Plastic Only  Potential for Rehab:        Good Code Status:   Full Code Disposition: Assisted Living facility Consults: none  Followup appointments:  Future Appointments  Date Time Provider Department Center  12/29/2023  8:45 AM [facility] MAMMO 1 [facility] MAMMO [facility]  12/29/2023  9:00 AM [facility] DEXA [facility] DIAG [facility]  12/29/2023 11:20 AM Lamar CHRISTELLA Borg, DPM [facility] None  01/09/2024 10:15 AM Victory LELON Kluver, MD [facility] CC  01/23/2024  8:30 AM [facility] FAM LAB [facility] FAM None  01/25/2024 11:00 AM Corean JONETTA Poplin, FNP [facility] FAM None  02/07/2024 10:30 AM Julee Snow, MD [facility] PSY None  03/19/2024  9:00 AM Ozell Stalls, MD WN NEU SLP None     Time spent in discharge process:  total time spent 35 minutes   Electronically signed: Norman KATHEE Servant, MD 12/21/2023 / 8:47 AM   *Some images could not be shown.

## 2024-03-15 ENCOUNTER — Emergency Department (HOSPITAL_COMMUNITY)
Admission: EM | Admit: 2024-03-15 | Discharge: 2024-03-16 | Disposition: A | Discharge status: Home / Self Care | Attending: Emergency Medicine | Admitting: Emergency Medicine

## 2024-03-15 DIAGNOSIS — Z794 Long term (current) use of insulin: Secondary | ICD-10-CM | POA: Insufficient documentation

## 2024-03-15 DIAGNOSIS — E162 Hypoglycemia, unspecified: Secondary | ICD-10-CM | POA: Insufficient documentation

## 2024-03-15 LAB — URINALYSIS, ROUTINE W REFLEX MICROSCOPIC
Bilirubin Urine: NEGATIVE
Glucose, UA: 50 mg/dL — AB
Ketones, ur: NEGATIVE mg/dL
Leukocytes,Ua: NEGATIVE
Nitrite: NEGATIVE
Protein, ur: 30 mg/dL — AB
Specific Gravity, Urine: 1.014 (ref 1.005–1.030)
pH: 5 (ref 5.0–8.0)

## 2024-03-15 LAB — CBC WITH DIFFERENTIAL/PLATELET
Abs Immature Granulocytes: 0.03 K/uL (ref 0.00–0.07)
Basophils Absolute: 0.1 K/uL (ref 0.0–0.1)
Basophils Relative: 1 %
Eosinophils Absolute: 0.2 K/uL (ref 0.0–0.5)
Eosinophils Relative: 3 %
HCT: 40.3 % (ref 36.0–46.0)
Hemoglobin: 12.4 g/dL (ref 12.0–15.0)
Immature Granulocytes: 0 %
Lymphocytes Relative: 24 %
Lymphs Abs: 2.2 K/uL (ref 0.7–4.0)
MCH: 27.6 pg (ref 26.0–34.0)
MCHC: 30.8 g/dL (ref 30.0–36.0)
MCV: 89.8 fL (ref 80.0–100.0)
Monocytes Absolute: 0.7 K/uL (ref 0.1–1.0)
Monocytes Relative: 8 %
Neutro Abs: 5.8 K/uL (ref 1.7–7.7)
Neutrophils Relative %: 64 %
Platelets: 192 K/uL (ref 150–400)
RBC: 4.49 MIL/uL (ref 3.87–5.11)
RDW: 14.5 % (ref 11.5–15.5)
WBC: 9 K/uL (ref 4.0–10.5)
nRBC: 0 % (ref 0.0–0.2)

## 2024-03-15 LAB — COMPREHENSIVE METABOLIC PANEL WITH GFR
ALT: 16 U/L (ref 0–44)
AST: 18 U/L (ref 15–41)
Albumin: 3.4 g/dL — ABNORMAL LOW (ref 3.5–5.0)
Alkaline Phosphatase: 94 U/L (ref 38–126)
Anion gap: 10 (ref 5–15)
BUN: 14 mg/dL (ref 8–23)
CO2: 23 mmol/L (ref 22–32)
Calcium: 8.5 mg/dL — ABNORMAL LOW (ref 8.9–10.3)
Chloride: 107 mmol/L (ref 98–111)
Creatinine, Ser: 0.86 mg/dL (ref 0.44–1.00)
GFR, Estimated: >60 mL/min (ref >60)
Glucose, Bld: 171 mg/dL — ABNORMAL HIGH (ref 70–99)
Potassium: 3.8 mmol/L (ref 3.5–5.1)
Sodium: 140 mmol/L (ref 135–145)
Total Bilirubin: 0.9 mg/dL (ref 0.0–1.2)
Total Protein: 6.3 g/dL — ABNORMAL LOW (ref 6.5–8.1)

## 2024-03-15 LAB — CBG MONITORING, ED
Glucose-Capillary: 60 mg/dL — ABNORMAL LOW (ref 70–99)
Glucose-Capillary: 173 mg/dL — ABNORMAL HIGH (ref 70–99)
Glucose-Capillary: 273 mg/dL — ABNORMAL HIGH (ref 70–99)

## 2024-03-15 NOTE — Discharge Instructions (Signed)
 Please plan to follow up with a primary care doctor for management of your diabetes. Take your insulin  as directed, targeted for the same time everyday before meals as prescribed. Keep a log of your blood sugars 3 times a day at the same time. Keep up your nutrition following a diabetic diet.   Return to the ED with any new or concerning changes at any time.

## 2024-03-15 NOTE — ED Triage Notes (Signed)
 PT arrives via EMS from home after family called for the pt acting lethargic. On EMS arrival pt had a BG of 60, family reports sugars are usually around 200. Pt given oral glucose by EMS with BG improvement to 120. Pt's BG at 60 upon arrival, provided juice and peanut butter during triage. EMS also reported an initial BP of 60/palp with improvement to 130's systolic after 500mL NS.  Reports taking her insulin  like normal today.

## 2024-03-15 NOTE — ED Provider Notes (Signed)
 Meagan Cummings EMERGENCY DEPARTMENT AT [facility] Provider Note   CSN: [id number] Arrival date & time: 03/15/24  2008     Patient presents with: Hypoglycemia   Meagan Cummings is a 68 y.o. female.   Patient to ED with low blood sugar and lethargy, per EMS as reported by family members. I spoke to Meagan Cummings, her son, who reports her blood sugar this past week has been sporadic, both spiking high and going low. She has had some lethargy with low readings but no recent illness, fever, vomiting. Last admission was July of this year for hyperglycemia, discharged to a nurse facility but son reports he recently signed her out and brought her home. EMS also reported low blood pressure at home of 60 systolic.   The history is provided by the patient and a relative. No language interpreter was used.  Hypoglycemia      Prior to Admission medications   Medication Sig Start Date End Date Taking? Authorizing Provider  atorvastatin  (LIPITOR) 40 MG tablet Take 40 mg by mouth daily.   Yes [provider]  budesonide-formoterol  (SYMBICORT) 160-4.5 MCG/ACT inhaler Inhale 2 puffs into the lungs 2 (two) times daily as needed (for shortness of breath).   Yes [provider]  gabapentin  (NEURONTIN ) 800 MG tablet Take 800 mg by mouth 3 (three) times daily. 09/10/20  Yes [provider]  insulin  lispro (HUMALOG) 100 UNIT/ML KwikPen Inject 3 Units into the skin 2 (two) times daily before a meal. Morning and evening   Yes [provider]  LINZESS 290 MCG CAPS capsule Take 290 mcg by mouth daily.   Yes [provider]  pantoprazole  (PROTONIX ) 40 MG tablet Take 40 mg by mouth 2 (two) times daily.   Yes [provider]  SPIRIVA  HANDIHALER 18 MCG inhalation capsule Place 1 capsule into inhaler and inhale daily.   Yes [provider]  thiamine  100 MG tablet Take 1 tablet (100 mg total) by mouth daily. 09/19/20  Yes Pokhrel, Laxman, MD   venlafaxine XR (EFFEXOR-XR) 75 MG 24 hr capsule Take 75 mg by mouth daily. 08/26/20  Yes [provider]  glucose blood test strip 1 each 4 (four) times daily. 12/12/14   [provider]  QUEtiapine  Fumarate (SEROQUEL  XR) 150 MG 24 hr tablet Take 150 mg by mouth at bedtime. Patient not taking: Reported on 03/15/2024 01/20/24   [provider]    Allergies: Other    Review of Systems  Updated Vital Signs BP 126/70   Pulse 83   Temp 98.4 F (36.9 C) (Oral)   Resp 17   Wt 72.6 kg   SpO2 92%   BMI 29.26 kg/m   Physical Exam Vitals and nursing note reviewed.  Constitutional:      Appearance: She is well-developed.  HENT:     Head: Normocephalic.  Cardiovascular:     Rate and Rhythm: Normal rate and regular rhythm.     Heart sounds: No murmur heard. Pulmonary:     Effort: Pulmonary effort is normal.     Breath sounds: Normal breath sounds. No wheezing, rhonchi or rales.  Abdominal:     General: Bowel sounds are normal.     Palpations: Abdomen is soft.     Tenderness: There is no abdominal tenderness. There is no guarding or rebound.  Musculoskeletal:        General: Normal range of motion.     Cervical back: Normal range of motion and neck supple.  Skin:  General: Skin is warm and dry.  Neurological:     General: No focal deficit present.     Mental Status: She is alert and oriented to person, place, and time.     (all labs ordered are listed, but only abnormal results are displayed) Labs Reviewed  COMPREHENSIVE METABOLIC PANEL WITH GFR - Abnormal; Notable for the following components:      Result Value   Glucose, Bld 171 (*)    Calcium  8.5 (*)    Total Protein 6.3 (*)    Albumin 3.4 (*)    All other components within normal limits  URINALYSIS, ROUTINE W REFLEX MICROSCOPIC - Abnormal; Notable for the following components:   APPearance HAZY (*)    Glucose, UA 50 (*)    Hgb urine dipstick SMALL (*)    Protein, ur 30 (*)    Bacteria, UA  RARE (*)    All other components within normal limits  CBG MONITORING, ED - Abnormal; Notable for the following components:   Glucose-Capillary 60 (*)    All other components within normal limits  CBG MONITORING, ED - Abnormal; Notable for the following components:   Glucose-Capillary 173 (*)    All other components within normal limits  CBG MONITORING, ED - Abnormal; Notable for the following components:   Glucose-Capillary 273 (*)    All other components within normal limits  CBC WITH DIFFERENTIAL/PLATELET  CBG MONITORING, ED    EKG: None  Radiology: No results found.   Procedures   Medications Ordered in the ED - No data to display  Clinical Course as of 03/15/24 2327  Thu Mar 15, 2024  2136 Patient to ED by EMS today after she was found to be slightly confused, lethargic. Per EMS, CBG 60, low BP of 60 systolic. BP improved on arrival to ED, EMS provided 500 cc fluids. CBG initially 60. She was given juice and peanut butter with significant improvement. She has no complaints.  [SU]  2259 Labs reassuring: no leukocytosis, UA negative. Repeat CBG's have been elevated at 173, 171, and (per RN) 273.  [SU]  2323 Discussed with Dr. Pamella. She is felt stable to be discharged. Son reports she does not have a PCP in this area. She is strongly encouraged to become established with a PCP of her choice.  [SU]    Clinical Course User Index [SU] Odell Balls, PA-C                                 Medical Decision Making Amount and/or Complexity of Data Reviewed Labs: ordered.        Final diagnoses:  Hypoglycemia    ED Discharge Orders     None          Odell Balls, PA-C 03/15/24 2327    Pamella Ozell LABOR, DO 03/18/24 2002

## 2024-03-21 ENCOUNTER — Ambulatory Visit: Admission: EM | Admit: 2024-03-21 | Discharge: 2024-03-21 | Disposition: A | Discharge status: Home / Self Care

## 2024-03-21 DIAGNOSIS — Z760 Encounter for issue of repeat prescription: Secondary | ICD-10-CM

## 2024-03-21 MED ORDER — BUDESONIDE-FORMOTEROL FUMARATE 160-4.5 MCG/ACT IN AERO: 2.0000 | INHALATION_SPRAY | Freq: Two times a day (BID) | RESPIRATORY_TRACT | 0 refills | Status: AC | PRN

## 2024-03-21 MED ORDER — INSULIN GLARGINE 100 UNIT/ML SOLOSTAR PEN: 20.0000 [IU] | PEN_INJECTOR | Freq: Two times a day (BID) | SUBCUTANEOUS | 0 refills | Status: AC

## 2024-03-21 MED ORDER — INSULIN LISPRO (1 UNIT DIAL) 100 UNIT/ML (KWIKPEN): 3.0000 [IU] | PEN_INJECTOR | Freq: Two times a day (BID) | SUBCUTANEOUS | 0 refills | Status: AC

## 2024-03-21 MED ORDER — LINZESS 290 MCG PO CAPS: 290.0000 ug | ORAL_CAPSULE | Freq: Every day | ORAL | 0 refills | Status: AC

## 2024-03-21 MED ORDER — QUETIAPINE FUMARATE ER 150 MG PO TB24: 150.0000 mg | ORAL_TABLET | Freq: Every day | ORAL | 0 refills | Status: AC

## 2024-03-21 NOTE — ED Provider Notes (Signed)
 [facility]    CSN: [id number] Arrival date & time: 03/21/24  1245      History   Chief Complaint No chief complaint on file.   HPI Meagan Cummings is a 68 y.o. female.   Patient presents today with a history of diabetes, hyperlipidemia, bipolar, and constipation.  Patient is in need of refills of Lantus , Symbicort, Humalog, Linzess, and Seroquel .  Patient has her next primary care appointment on October 20 and states that she has no refills at her pharmacy.  Patient was provided 1 month of refills.     Past Medical History:  Diagnosis Date   Depression    Diabetes mellitus without complication [facility])     Patient Active Problem List   Diagnosis Date Noted   DKA (diabetic ketoacidosis) (HCC) 04/29/2020   Altered mental status    Hypernatremia    Ketosis (HCC) 12/25/2019   Acute metabolic encephalopathy 12/25/2019   Hypoglycemia 12/24/2019   Acute metabolic encephalopathy 12/24/2019   Uncontrolled diabetes mellitus 12/24/2019   Acute renal failure syndrome    Diabetic ketoacidosis (HCC)     Past Surgical History:  Procedure Laterality Date   ABDOMINAL HYSTERECTOMY      OB History   No obstetric history on file.      Home Medications    Prior to Admission medications   Medication Sig Start Date End Date Taking? Authorizing Provider  insulin  glargine (LANTUS ) 100 UNIT/ML Solostar Pen Inject 20 Units into the skin 2 (two) times daily. 03/21/24 04/20/24 Yes Andra Corean BROCKS, PA-C  atorvastatin  (LIPITOR) 40 MG tablet Take 40 mg by mouth daily.    [provider]  budesonide-formoterol  (SYMBICORT) 160-4.5 MCG/ACT inhaler Inhale 2 puffs into the lungs 2 (two) times daily as needed (for shortness of breath). 03/21/24   Andra Corean BROCKS, PA-C  gabapentin  (NEURONTIN ) 800 MG tablet Take 800 mg by mouth 3 (three) times daily. 09/10/20   [provider]  glucose blood test strip 1 each 4 (four) times daily. 12/12/14   [provider]  insulin  lispro (HUMALOG) 100 UNIT/ML KwikPen Inject 3 Units into the skin 2 (two) times daily before a meal. Morning and evening 03/21/24   Andra Corean BROCKS, PA-C  LINZESS 290 MCG CAPS capsule Take 1 capsule (290 mcg total) by mouth daily. 03/21/24   Andra Corean BROCKS, PA-C  pantoprazole  (PROTONIX ) 40 MG tablet Take 40 mg by mouth 2 (two) times daily.    [provider]  QUEtiapine  Fumarate (SEROQUEL  XR) 150 MG 24 hr tablet Take 1 tablet (150 mg total) by mouth at bedtime. 03/21/24   Andra Corean BROCKS, PA-C  SPIRIVA  HANDIHALER 18 MCG inhalation capsule Place 1 capsule into inhaler and inhale daily.    [provider]  thiamine  100 MG tablet Take 1 tablet (100 mg total) by mouth daily. 09/19/20   Pokhrel, Laxman, MD  venlafaxine XR (EFFEXOR-XR) 75 MG 24 hr capsule Take 75 mg by mouth daily. 08/26/20   [provider]    Family History History reviewed. No pertinent family history.  Social History Social History   Tobacco Use   Smoking status: Every Day    Current packs/day: 0.50    Types: Cigarettes   Smokeless tobacco: Never  Vaping Use   Vaping status: Never Used  Substance Use Topics   Alcohol use: Yes    Alcohol/week: 1.0 standard drink of alcohol    Types: 1 Cans of beer per week   Drug use: Not Currently  Types: Cocaine, Marijuana     Allergies   Other   Review of Systems Review of Systems   Physical Exam Triage Vital Signs ED Triage Vitals  Encounter Vitals Group     BP 03/21/24 1306 (!) 136/94     Girls Systolic BP Percentile --      Girls Diastolic BP Percentile --      Boys Systolic BP Percentile --      Boys Diastolic BP Percentile --      Pulse Rate 03/21/24 1306 80     Resp 03/21/24 1306 18     Temp 03/21/24 1306 98.8 F (37.1 C)     Temp Source 03/21/24 1306 Oral     SpO2 03/21/24 1306 93 %     Weight --      Height --      Head Circumference --      Peak Flow --      Pain Score 03/21/24 1305 0      Pain Loc --      Pain Education --      Exclude from Growth Chart --    No data found.  Updated Vital Signs BP (!) 136/94 (BP Location: Right Arm)   Pulse 80   Temp 98.8 F (37.1 C) (Oral)   Resp 18   SpO2 93%   Visual Acuity Right Eye Distance:   Left Eye Distance:   Bilateral Distance:    Right Eye Near:   Left Eye Near:    Bilateral Near:     Physical Exam Vitals and nursing note reviewed.  Constitutional:      General: She is not in acute distress.    Appearance: Normal appearance. She is not ill-appearing, toxic-appearing or diaphoretic.  Eyes:     General: No scleral icterus. Cardiovascular:     Rate and Rhythm: Normal rate and regular rhythm.     Heart sounds: Normal heart sounds.  Pulmonary:     Effort: Pulmonary effort is normal. No respiratory distress.     Breath sounds: Normal breath sounds. No wheezing or rhonchi.  Skin:    General: Skin is warm.  Neurological:     Mental Status: She is alert and oriented to person, place, and time.  Psychiatric:        Mood and Affect: Mood normal.        Behavior: Behavior normal.      UC Treatments / Results  Labs (all labs ordered are listed, but only abnormal results are displayed) Labs Reviewed - No data to display  EKG   Radiology No results found.  Procedures Procedures (including critical care time)  Medications Ordered in UC Medications - No data to display  Initial Impression / Assessment and Plan / UC Course  I have reviewed the triage vital signs and the nursing notes.  Pertinent labs & imaging results that were available during my care of the patient were reviewed by me and considered in my medical decision making (see chart for details).     Encounter for medication refill-refilled medications requested by patient for 1 month. Final Clinical Impressions(s) / UC Diagnoses   Final diagnoses:  Encounter for medication refill     Discharge Instructions      Be sure to keep  follow-up appt with PCP on Oct. 20th, you have been prescribed refills for 1 month.     ED Prescriptions     Medication Sig Dispense Auth. Provider   insulin  glargine (LANTUS ) 100 UNIT/ML  Solostar Pen Inject 20 Units into the skin 2 (two) times daily. 12 mL Andra Krabbe C, PA-C   budesonide-formoterol  [facility]) 160-4.5 MCG/ACT inhaler Inhale 2 puffs into the lungs 2 (two) times daily as needed (for shortness of breath). 1 each Andra Krabbe BROCKS, PA-C   insulin  lispro (HUMALOG) 100 UNIT/ML KwikPen Inject 3 Units into the skin 2 (two) times daily before a meal. Morning and evening 3 mL Andra Krabbe BROCKS, PA-C   LINZESS 290 MCG CAPS capsule Take 1 capsule (290 mcg total) by mouth daily. 30 capsule Andra Krabbe C, PA-C   QUEtiapine  Fumarate (SEROQUEL  XR) 150 MG 24 hr tablet Take 1 tablet (150 mg total) by mouth at bedtime. 30 tablet Andra Krabbe BROCKS, PA-C      PDMP not reviewed this encounter.   Andra Krabbe BROCKS, PA-C 03/21/24 1331

## 2024-03-21 NOTE — ED Triage Notes (Signed)
 Pt requested Lantus , Seroquel , Farxiga, Linzess, Symbicort refills. Pt next appt with PCP 04/09/2024.

## 2024-03-21 NOTE — Discharge Instructions (Addendum)
 Be sure to keep follow-up appt with PCP on Oct. 20th, you have been prescribed refills for 1 month.

## 2024-03-29 ENCOUNTER — Telehealth: Payer: Self-pay
